# Patient Record
Sex: Male | Born: 1980
Health system: Southern US, Community
[De-identification: ages and names within clinical notes are randomized; demographics above are authoritative.]

## PROBLEM LIST (undated history)

## (undated) DIAGNOSIS — F39 Unspecified mood [affective] disorder: Secondary | ICD-10-CM

## (undated) DIAGNOSIS — F329 Major depressive disorder, single episode, unspecified: Secondary | ICD-10-CM

## (undated) DIAGNOSIS — E739 Lactose intolerance, unspecified: Secondary | ICD-10-CM

## (undated) DIAGNOSIS — L2089 Other atopic dermatitis: Secondary | ICD-10-CM

## (undated) DIAGNOSIS — H9190 Unspecified hearing loss, unspecified ear: Secondary | ICD-10-CM

## (undated) DIAGNOSIS — E785 Hyperlipidemia, unspecified: Secondary | ICD-10-CM

## (undated) DIAGNOSIS — J309 Allergic rhinitis, unspecified: Secondary | ICD-10-CM

## (undated) DIAGNOSIS — R51 Headache: Secondary | ICD-10-CM

## (undated) DIAGNOSIS — F3289 Other specified depressive episodes: Secondary | ICD-10-CM

## (undated) DIAGNOSIS — K219 Gastro-esophageal reflux disease without esophagitis: Secondary | ICD-10-CM

## (undated) HISTORY — DX: Unspecified hearing loss, unspecified ear: H91.90

## (undated) HISTORY — DX: Hyperlipidemia, unspecified: E78.5

## (undated) HISTORY — DX: Gastro-esophageal reflux disease without esophagitis: K21.9

## (undated) HISTORY — DX: Other specified depressive episodes: F32.89

## (undated) HISTORY — DX: Other atopic dermatitis: L20.89

## (undated) HISTORY — DX: Allergic rhinitis, unspecified: J30.9

## (undated) HISTORY — DX: Headache: R51

## (undated) HISTORY — DX: Lactose intolerance, unspecified: E73.9

## (undated) HISTORY — DX: Unspecified mood (affective) disorder: F39

## (undated) HISTORY — DX: Major depressive disorder, single episode, unspecified: F32.9

---

## 1998-12-12 ENCOUNTER — Emergency Department (HOSPITAL_COMMUNITY): Admission: EM | Admit: 1998-12-12 | Discharge: 1998-12-12 | Payer: Self-pay | Admitting: Emergency Medicine

## 2004-08-10 ENCOUNTER — Emergency Department (HOSPITAL_COMMUNITY): Admission: EM | Admit: 2004-08-10 | Discharge: 2004-08-10 | Payer: Self-pay | Admitting: Emergency Medicine

## 2004-11-05 ENCOUNTER — Emergency Department (HOSPITAL_COMMUNITY): Admission: EM | Admit: 2004-11-05 | Discharge: 2004-11-05 | Payer: Self-pay | Admitting: Emergency Medicine

## 2005-05-03 ENCOUNTER — Encounter: Admission: RE | Admit: 2005-05-03 | Discharge: 2005-05-03 | Payer: Self-pay | Admitting: Emergency Medicine

## 2007-12-18 HISTORY — PX: OTHER SURGICAL HISTORY: SHX169

## 2008-03-28 ENCOUNTER — Emergency Department (HOSPITAL_COMMUNITY): Admission: EM | Admit: 2008-03-28 | Discharge: 2008-03-29 | Payer: Self-pay | Admitting: Emergency Medicine

## 2008-08-14 LAB — CONVERTED CEMR LAB
ALT: 41 units/L
AST: 34 units/L
CO2: 21 meq/L
Chloride: 105 meq/L
Eosinophils Relative: 2 %
HDL: 39 mg/dL
LDL Cholesterol: 41 mg/dL
Lymphocytes, automated: 38 %
Neutrophils Relative %: 53 %
Platelets: 298 10*3/uL
Potassium: 3.9 meq/L
RBC: 4.28 M/uL
RDW: 12.7 %
Total Bilirubin: 0.4 mg/dL
Total Protein: 7.4 g/dL
Triglyceride fasting, serum: 206 mg/dL
WBC: 6.5 10*3/uL

## 2009-03-23 ENCOUNTER — Emergency Department (HOSPITAL_COMMUNITY): Admission: EM | Admit: 2009-03-23 | Discharge: 2009-03-23 | Payer: Self-pay | Admitting: Emergency Medicine

## 2009-09-01 ENCOUNTER — Ambulatory Visit: Payer: Self-pay | Admitting: Internal Medicine

## 2009-09-01 DIAGNOSIS — R519 Headache, unspecified: Secondary | ICD-10-CM | POA: Insufficient documentation

## 2009-09-01 DIAGNOSIS — K219 Gastro-esophageal reflux disease without esophagitis: Secondary | ICD-10-CM

## 2009-09-01 DIAGNOSIS — F329 Major depressive disorder, single episode, unspecified: Secondary | ICD-10-CM

## 2009-09-01 DIAGNOSIS — R51 Headache: Secondary | ICD-10-CM

## 2009-09-01 DIAGNOSIS — Z9189 Other specified personal risk factors, not elsewhere classified: Secondary | ICD-10-CM | POA: Insufficient documentation

## 2009-09-01 DIAGNOSIS — F39 Unspecified mood [affective] disorder: Secondary | ICD-10-CM

## 2009-09-01 DIAGNOSIS — E785 Hyperlipidemia, unspecified: Secondary | ICD-10-CM | POA: Insufficient documentation

## 2009-09-01 LAB — CONVERTED CEMR LAB
Cholesterol: 201 mg/dL — ABNORMAL HIGH (ref 0–200)
Direct LDL: 136.8 mg/dL
HDL: 36.6 mg/dL — ABNORMAL LOW (ref >39.00)
Total CHOL/HDL Ratio: 5
Triglycerides: 185 mg/dL — ABNORMAL HIGH (ref 0.0–149.0)
VLDL: 37 mg/dL (ref 0.0–40.0)

## 2009-10-03 ENCOUNTER — Encounter: Payer: Self-pay | Admitting: Internal Medicine

## 2010-05-24 ENCOUNTER — Telehealth: Payer: Self-pay | Admitting: Internal Medicine

## 2010-05-29 ENCOUNTER — Ambulatory Visit: Payer: Self-pay | Admitting: Internal Medicine

## 2010-05-29 DIAGNOSIS — H9190 Unspecified hearing loss, unspecified ear: Secondary | ICD-10-CM | POA: Insufficient documentation

## 2010-05-29 DIAGNOSIS — J309 Allergic rhinitis, unspecified: Secondary | ICD-10-CM | POA: Insufficient documentation

## 2010-05-29 DIAGNOSIS — H6120 Impacted cerumen, unspecified ear: Secondary | ICD-10-CM

## 2010-09-29 ENCOUNTER — Telehealth: Payer: Self-pay | Admitting: Internal Medicine

## 2010-10-13 ENCOUNTER — Ambulatory Visit: Payer: Self-pay | Admitting: Internal Medicine

## 2010-10-17 LAB — CONVERTED CEMR LAB
ALT: 32 units/L (ref 0–53)
Basophils Absolute: 0 10*3/uL (ref 0.0–0.1)
Basophils Relative: 0.5 % (ref 0.0–3.0)
Bilirubin Urine: NEGATIVE
Bilirubin, Direct: 0.1 mg/dL (ref 0.0–0.3)
Cholesterol: 182 mg/dL (ref 0–200)
GFR calc non Af Amer: 108.63 mL/min (ref >60)
HCT: 37 % — ABNORMAL LOW (ref 39.0–52.0)
Hemoglobin, Urine: NEGATIVE
Ketones, ur: NEGATIVE mg/dL
LDL Cholesterol: 115 mg/dL — ABNORMAL HIGH (ref 0–99)
Leukocytes, UA: NEGATIVE
Lymphocytes Relative: 36 % (ref 12.0–46.0)
Lymphs Abs: 2.3 10*3/uL (ref 0.7–4.0)
MCHC: 35 g/dL (ref 30.0–36.0)
MCV: 88.7 fL (ref 78.0–100.0)
Monocytes Absolute: 0.4 10*3/uL (ref 0.1–1.0)
Neutro Abs: 3.3 10*3/uL (ref 1.4–7.7)
RBC: 4.17 M/uL — ABNORMAL LOW (ref 4.22–5.81)
Specific Gravity, Urine: >=1.03 (ref 1.000–1.030)
TSH: 1.97 microintl units/mL (ref 0.35–5.50)
Total Bilirubin: 0.9 mg/dL (ref 0.3–1.2)
Total CHOL/HDL Ratio: 5
Total Protein, Urine: NEGATIVE mg/dL
Triglycerides: 152 mg/dL — ABNORMAL HIGH (ref 0.0–149.0)
Urobilinogen, UA: 0.2 (ref 0.0–1.0)
VLDL: 30.4 mg/dL (ref 0.0–40.0)
WBC: 6.3 10*3/uL (ref 4.5–10.5)
pH: 5.5 (ref 5.0–8.0)

## 2010-10-19 ENCOUNTER — Ambulatory Visit: Payer: Self-pay | Admitting: Internal Medicine

## 2010-10-19 DIAGNOSIS — L2089 Other atopic dermatitis: Secondary | ICD-10-CM

## 2011-01-16 NOTE — Progress Notes (Signed)
Summary: Referral/VAL pt  Phone Note Call from Patient   Caller: Spouse Crystal [phone] w  Summary of Call: Pt spouse called requesting referral for pt to ENT for decreased hearing LT ear. Pt is deaf in Rt ear. Initial call taken by: Margaret Pyle, CMA,  May 24, 2010 2:57 PM  Follow-up for Phone Call        could it be ear wax?  if no pain, fever ST or headache, consider OV to check for ear wax Follow-up by: Corwin Levins MD,  May 24, 2010 3:51 PM  Additional Follow-up for Phone Call Additional follow up Details #1::        pt informed and transferred to sch appt with VAL Additional Follow-up by: Margaret Pyle, CMA,  May 24, 2010 3:57 PM

## 2011-01-16 NOTE — Progress Notes (Signed)
Summary: med refills  Phone Note Refill Request Message from:  Fax from Pharmacy on September 29, 2010 10:42 AM  Refills Requested: Medication #1:  ZOCOR 20 MG TABS take 1 by mouth qd   Last Refilled: 06/29/2010  Medication #2:  Fenofibrate 160mg  take 1 by mouth qd   Last Refilled: 06/29/2010  Medication #3:  fluoxetine 20mg  take 1 by mouth qd   Last Refilled: 06/29/2010 [facility]  Initial call taken by: Orlan Leavens RMA,  September 29, 2010 10:43 AM    New/Updated Medications: FENOFIBRATE 160 MG TABS (FENOFIBRATE) take 1 by mouth once daily Overdue for yearly physical must see md for addtional refills ZOCOR 20 MG TABS (SIMVASTATIN) take 1 by mouth once daily. Must see md b4 addtional refills FLUOXETINE HCL 20 MG CAPS (FLUOXETINE HCL) take 2 by mouth once daily. Overdue for yearly physical must see md for addtional refills Prescriptions: FLUOXETINE HCL 20 MG CAPS (FLUOXETINE HCL) take 2 by mouth once daily. Overdue for yearly physical must see md for addtional refills  #60 x 0   Entered by:   Orlan Leavens RMA   Authorized by:   Newt Lukes MD   Signed by:   Orlan Leavens RMA on 09/29/2010   Method used:   Electronically to        [facility]* (retail)       [address]       [address] Shipping/mailing       [place], [place]  [address]       Ph: [phone]       Fax: [phone]   RxID:   [id number] ZOCOR 20 MG TABS (SIMVASTATIN) take 1 by mouth once daily. Must see md b4 addtional refills  #30 x 0   Entered by:   Orlan Leavens RMA   Authorized by:   Newt Lukes MD   Signed by:   Orlan Leavens RMA on 09/29/2010   Method used:   Electronically to        [facility]* (retail)       [address]       [address] Shipping/mailing       [place], [place]  [address]       Ph: [phone]       Fax: [phone]   RxID:   [id number] FENOFIBRATE 160 MG TABS (FENOFIBRATE) take 1 by mouth once daily Overdue for yearly  physical must see md for addtional refills  #30 x 0   Entered by:   Orlan Leavens RMA   Authorized by:   Newt Lukes MD   Signed by:   Orlan Leavens RMA on 09/29/2010   Method used:   Electronically to        [facility]* (retail)       [address]       [address] Shipping/mailing       [place], [place]  [address]       Ph: [phone]       Fax: [phone]   RxID:   [id number]

## 2011-01-16 NOTE — Assessment & Plan Note (Signed)
Summary: decreased hearing-lb   Vital Signs:  Patient profile:   30 year old male Height:      67 inches (170.18 cm) Weight:      201.0 pounds (91.36 kg) O2 Sat:      97 % on Room air Temp:     97.6 degrees F (36.44 degrees C) oral Pulse rate:   74 / minute BP sitting:   122 / 78  (left arm) Cuff size:   large  Vitals Entered By: Orlan Leavens (May 29, 2010 9:12 AM)  O2 Flow:  Room air CC: Hearing loss in (L) ear Is Patient Diabetic? No Pain Assessment Patient in pain? no        Primary Care Provider:  Newt Lukes MD  CC:  Hearing loss in (L) ear.  History of Present Illness: c/o hearing loss in left ear - onset of problem 2 months ago - sudden loss - no ear pain - no drainage - no fever or headache - +psotnasal drip and allergies worse than usual - hearing described as about "50% gone" compared to previous hearing on left side - 100% loss of hearing on right side since childhood -  Current Medications (verified): 1)  Prozac 40 Mg Caps (Fluoxetine Hcl) .... Take 1 By Mouth Qd 2)  Tricor 145 Mg Tabs (Fenofibrate) .... Take 1 By Mouth Qd 3)  Zocor 20 Mg Tabs (Simvastatin) .... Take 1 By Mouth Qd 4)  Pepcid Ac 10 Mg Tabs (Famotidine) .... Use Prn 5)  Benadryl 25 Mg Caps (Diphenhydramine Hcl) .... Take 1 By Mouth At Bedtime  Allergies (verified): No Known Drug Allergies  Past History:  Past Medical History: GERD Hyperlipidemia right ear deaf, congenital depression (anger mgmt) seasonal allergies  Review of Systems       The patient complains of decreased hearing.  The patient denies fever, weight loss, chest pain, syncope, prolonged cough, headaches, and hemoptysis.    Physical Exam  General:  overweight-appearing.  alert, well-developed, well-nourished, and cooperative to examination.   spouse at side Ears:  normal pinnae bilaterally, without erythema, swelling, or tenderness to palpation. R TM clear, without effusion, or cerumen impaction. L TM  initially occuluded with soft wax - after irrigation, TM slightly red but intact - Hearing grossly intact on left - no hearing on right Mouth:  teeth and gums in good repair; mucous membranes moist, without lesions or ulcers. oropharynx clear without exudate, no erythema.  Neurologic:  alert & oriented X3 and cranial nerves II-XII symetrically intact.  strength normal in all extremities, sensation intact to light touch, and gait normal. speech fluent without dysarthria or aphasia; follows commands with good comprehension.    Impression & Recommendations:  Problem # 1:  CERUMEN IMPACTION, LEFT (ICD-380.4)  Procedure: ear irrigation Reason: wax impaction Risk/Benefit were discussed with patient who agrees to proceed. ear was irrigated with warm water. Large amount of wax was removed. Instrumentation with metal ear loop was performed to accomplish the wax removal. Patient tolerated procedure well. Complications: none   Orders: Cerumen Impaction Removal ([address])  Problem # 2:  HEARING LOSS, BILATERAL (ICD-389.9)  congential on right, now affected on left -  though improved s/p cerumen disimpaction, will refer to ENT for formal hearing eval and tx as needed   Orders: ENT Referral (ENT)  Problem # 3:  ALLERGIC RHINITIS (ICD-477.9)  add daily loratadine His updated medication list for this problem includes:    Benadryl 25 Mg Caps (Diphenhydramine hcl) .Marland Kitchen... Take 1  by mouth at bedtime    Loratadine 10 Mg Tabs (Loratadine) .Marland Kitchen... 1 by mouth once daily  Discussed use of allergy medications and environmental measures.   Complete Medication List: 1)  Prozac 40 Mg Caps (Fluoxetine hcl) .... Take 1 by mouth qd 2)  Tricor 145 Mg Tabs (Fenofibrate) .... Take 1 by mouth qd 3)  Zocor 20 Mg Tabs (Simvastatin) .... Take 1 by mouth qd 4)  Pepcid Ac 10 Mg Tabs (Famotidine) .... Use prn 5)  Benadryl 25 Mg Caps (Diphenhydramine hcl) .... Take 1 by mouth at bedtime 6)  Loratadine 10 Mg Tabs  (Loratadine) .Marland Kitchen.. 1 by mouth once daily  Patient Instructions: 1)  it was good to see you today. 2)  your left ear has been irrigated for wax removal today - can use wax removal drops from drug store to prevent recurrence of wax buildup 3)  we'll make referral to ENT for hearing evaluation. Our office will contact you regarding this appointment once made.  4)  take OTC loradatine for your allergy and sinus symptoms - ok to continue benadryl at bedtime as ongoing 5)  Please schedule a follow-up appointment as needed/as previously scheduled.
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
## 2011-08-01 ENCOUNTER — Encounter: Payer: Self-pay | Admitting: Internal Medicine

## 2011-08-28 ENCOUNTER — Encounter: Payer: Self-pay | Admitting: Internal Medicine

## 2011-08-28 ENCOUNTER — Ambulatory Visit (INDEPENDENT_AMBULATORY_CARE_PROVIDER_SITE_OTHER): Payer: 59 | Admitting: Internal Medicine

## 2011-08-28 VITALS — BP 110/62 | HR 65 | Temp 98.7°F | Ht 67.0 in | Wt 205.0 lb

## 2011-08-28 DIAGNOSIS — R197 Diarrhea, unspecified: Secondary | ICD-10-CM

## 2011-08-28 DIAGNOSIS — E739 Lactose intolerance, unspecified: Secondary | ICD-10-CM

## 2011-08-28 MED ORDER — METRONIDAZOLE 500 MG PO TABS: 500.0000 mg | ORAL_TABLET | Freq: Three times a day (TID) | ORAL | Status: DC

## 2011-08-28 NOTE — Progress Notes (Signed)
  Subjective:    Patient ID: Jason Phillips, male    DOB: [birth year], 30 y.o.   MRN: [id number]  HPI complains of diarrhea ?lactulose intol Onset 2 week ago associated with increase frequency, loose to liquid - usual = 2-3/d, upto 6+/d last 2 weeks Also associated with diffuse abd cramping before and during bowel movement  Multiple bowel movement/day with white mucus Denies travel or exposure to undercooked foods but well water exposure 3 weeks ago at in-laws in [facility] No fever  Past Medical History  Diagnosis Date  . ALLERGIC RHINITIS   . DEPRESSION   . DERMATITIS, ATOPIC   . GERD   . Headache   . 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 HYPERLIPIDEMIA   . MOOD DISORDER   . Lactose intolerance       Review of Systems  Constitutional: Negative for fever and unexpected weight change.  Respiratory: Negative for shortness of breath and wheezing.   Cardiovascular: Negative for chest pain.  Gastrointestinal: Negative for vomiting, constipation, blood in stool and rectal pain.       Objective:   Physical Exam BP 110/62  Pulse 65  Temp(Src) 98.7 F (37.1 C) (Oral)  Ht 5\' 7"  (1.702 m)  Wt 205 lb (92.987 kg)  BMI 32.11 kg/m2  SpO2 97% Wt Readings from Last 3 Encounters:  08/28/11 205 lb (92.987 kg)  10/19/10 204 lb (92.534 kg)  05/29/10 201 lb (91.173 kg)   Constitutional:  overweight. appears well-developed and well-nourished. No distress.  Neck: Normal range of motion. Neck supple. No JVD present. No thyromegaly present.  Cardiovascular: Normal rate, regular rhythm and normal heart sounds.  No murmur heard. no BLE edema Pulmonary/Chest: Effort normal and breath sounds normal. No respiratory distress. no wheezes.  Abdominal: Soft. Bowel sounds are normal. Patient exhibits no distension. There is no tenderness.  no rebound/gaurding Skin: Skin is warm and dry.  No erythema or ulceration.  Psychiatric: he has a normal mood and affect. behavior is normal.  Judgment and thought content normal.   Lab Results  Component Value Date   WBC 6.3 10/13/2010   HGB 12.9* 10/13/2010   HCT 37.0* 10/13/2010   PLT 333.0 10/13/2010   CHOL 182 10/13/2010   TRIG 152.0* 10/13/2010   HDL 36.20* 10/13/2010   LDLDIRECT 136.8 09/01/2009   ALT 32 10/13/2010   AST 28 10/13/2010   NA 142 10/13/2010   K 4.4 10/13/2010   CL 107 10/13/2010   CREATININE 0.9 10/13/2010   BUN 13 10/13/2010   CO2 28 10/13/2010   TSH 1.97 10/13/2010      Assessment & Plan:  Diarrhea - complicating chronic freq BMs and apparent lactose intolerance (improved with Lactaid supplements until 2 weeks ago) rule out infx etiology and tx emperic Flagyl x 7 d Consider need for other GI eval depending on symptoms and response - pt/spouse understand and agree

## 2011-08-28 NOTE — Patient Instructions (Signed)
It was good to see you today. Test(s) ordered today. Your results will be called to you after review (48-72hours after test completion). If any changes need to be made, you will be notified at that time. Start Flagyl antibiotics x 1 week - Your prescription(s) have been submitted to your pharmacy. Please take as directed and contact our office if you believe you are having problem(s) with the medication(s). Continue lactose avoidance and use of Lactaid as needed If continued problems or unimproved, let us know so we can refer to GI as needed Lactose Intolerance, General Lactose intolerance is the inability to digest enough lactose. That is the sugar in milk. This inability results from a shortage of the enzyme lactase. This enzyme is normally made by the cells that line the small intestine. Lactase breaks down milk sugar into simpler forms. These can then be absorbed into the bloodstream. When there is not enough lactase to digest the amount of lactose consumed, the result may cause discomfort. While not all people low in lactase have symptoms, those who do are considered to be lactose intolerant. SYMPTOMS   Common symptoms include nausea, cramps, bloating, gas and diarrhea. These symptoms begin about 30 minutes to 2 hours after eating or drinking foods that contain lactose. How bad the symptoms get depends on the amount of lactose each person can tolerate. CAUSES Certain digestive diseases and injuries to the small intestine can reduce the amount of enzymes produced. In rare cases, children are born without the ability to make lactase. For most people, though, lactase deficiency is a condition that develops naturally over time. After about the age of 2 years, the body begins to produce less lactase. But many people may not experience symptoms (problems) until they are much older. WHO IS AT RISK FOR LACTOSE INTOLERANCE? Certain ethnic and racial populations are more widely affected than others. As many  as 75 percent of all African Americans and American Indians and 90 percent of Asian Americans are lactose intolerant. The condition is least common among persons of northern European descent.  TREATMENT  Lactose intolerance is fairly easy to treat. No treatment can improve the body's ability to produce lactase. But symptoms can be controlled through diet.   Young children with lactase deficiency should not eat any foods that contain lactose. Older children and adults do not need to avoid lactose completely. But people differ in the amounts and types of foods they can handle. For example, one person may have symptoms after drinking a small glass of milk, while another can drink one glass but not two. Others may be able to manage ice cream and aged cheeses, such as cheddar and Swiss, but not other dairy products. Dietary control of lactose intolerance depends on people learning through trial and error how much lactose they can handle.   For those who react to very small amounts of lactose or have trouble limiting their intake of foods that contain it, lactase enzymes are available without a prescription. These help people digest foods that contain lactose. The tablets are taken with the first bite of dairy food. Lactase enzyme is also available as a liquid. Adding a few drops of the enzyme will convert the lactose in milk or cream. This makes it more digestible for people with lactose intolerance.   Lactose-reduced milk and other products are available at most supermarkets. The milk contains all of the nutrients found in regular milk. It also remains fresh for about the same length of time, or longer  if it is super-pasteurized.   HOW IS NUTRITION BALANCED? Milk and other dairy products are a major source of nutrients. The most important of these nutrients is calcium. Calcium is essential for the growth and repair of bones throughout life. In the middle and later years, a shortage of calcium may lead to  thin, fragile bones that break easily. This condition is called osteoporosis. A concern for both children and adults with lactose intolerance is getting enough calcium in a diet that includes little or no milk. In planning meals, making sure that each day's diet includes enough calcium is important, even if the diet does not contain dairy products. Many non-dairy foods are high in calcium. Green vegetables, such as broccoli and kale, and fish with soft, edible bones, such as salmon and sardines, are excellent sources of calcium. To help in planning a high-calcium and low-lactose diet, the table that follows lists some common foods that are good sources of dietary calcium and shows how much lactose they contain. Recent research shows that yogurt with active cultures may be a good source of calcium for many people with lactose intolerance, even though it is fairly high in lactose. Evidence shows that the bacterial cultures that are used to make yogurt produce some of the lactase enzyme required for proper digestion of lactose. CALCIUM AND LACTOSE IN COMMON FOODS Vegetables  Calcium-fortified orange juice, 1 cup Sardines, with edible bones, 3 oz. Salmon,canned,with edible bones, 3 oz. Soymilk, fortified, 1 cup Broccoli (raw), 1 cup Orange, 1 medium Pinto beans, 1/2 cup Tuna, canned, 3 oz. Lettuce greens, 1/2 cup  Calcium Content  308-344 mg 270 mg 205 mg 200 mg 90 mg 50 mg 40 mg 10 mg 10 mg  Lactose    Content 0 0 0 0 0 0 0 0   Dairy Products  Yogurt, plain, low-fat, 1 cup Milk, reduced fat, 1 cup Swiss cheese, 1 oz. Ice cream, 1/2 cup Cottage cheese, 1/2 cup  415 mg  295 mg 270 mg 85 mg 75 mg  5 g  11g 1g 6g 2-3g   Some people with lactose intolerance may think they are not getting enough calcium and vitamin D in their diet. Talking with a caregiver or dietitian may be helpful in deciding whether any dietary supplements are needed. Taking vitamins or minerals of the wrong  kind or in the wrong amounts can be harmful. A dietitian can help in planning meals that will provide the most nutrients with the least chance of causing discomfort. WHAT IS HIDDEN LACTOSE? Although milk and foods made from milk are the only natural sources, lactose is often added to prepared foods. People with very low tolerance for lactose should know about the many food products that may contain even small amounts of lactose, such as:  Bread and other baked goods.   Processed breakfast cereals.     Instant potatoes and soups.     Powdered meal-replacement.     Margarine.   Lunch meats (non-kosher).     Candies and other snacks.     Supplements.    Pancake, biscuits and cookie mixes.   Breakfast cereals.     Salad dressings.     Some products labeled non-dairy, such as powdered coffee creamer and whipped toppings may also include ingredients that are derived from milk and therefore contain lactose. Smart shoppers learn to read food labels with care, looking not only for milk and lactose among the contents, but also for such words as whey, curds,  milk by-products, dry milk solids, and nonfat dry milk powder. If any of these are listed on a label, the product contains lactose. In addition, lactose is used as the base for more than 20 percent of prescription drugs and about 6 percent of over-the-counter medicines. Many types of birth control pills, for example, contain lactose, as do some tablets for stomach acid and gas. But these products typically affect only people with severe lactose intolerance. SUMMARY Even though lactose intolerance is widespread, it need not be a serious threat to good health. People who have trouble digesting lactose can learn which dairy products and other foods they can eat without discomfort and which ones they should avoid. Many will be able to enjoy milk, ice cream, and other such products if they take them in small amounts or eat other food at the same time.  Others can use lactase liquid or tablets to help digest the lactose. Even older women at risk for osteoporosis and growing children who must avoid milk and foods made with milk can meet most of their special dietary needs by eating greens, fish, and other calcium-rich foods that are free of lactose. A carefully chosen diet, with calcium supplements if the caregiver or dietitian recommends them, is the key to reducing symptoms and protecting future health. Document Released: 12/03/2005 Document Re-Released: 05/23/2010 [facility] Patient Information 2011 [place], [place].Lactose Free Diet Lactose is a carbohydrate that is found mainly in milk and milk products, as well as in foods with added milk or whey. Lactose must be digested by the enzyme in order to be used by the body. Lactose intolerance occurs when there is a shortage of lactase. When your body is not able to digest lactose, you may feel sick to your stomach (nausea), bloating, cramping, gas and diarrhea. TYPES OF LACTASE DEFICIENCY  Primary lactase deficiency. This is the most common type. It is characterized by a slow decrease in lactase activity.   Secondary lactase deficiency. This occurs following injury to the small intestinal mucosa as a result of diseases such as celiac disease, nontropical sprue, infectious gastroenteritis (stomach virus), malnutrition, parasites, or inflammatory bowel disease. It can also occur after treatment with medications that kill germs (antibiotics) or cancer drugs, or as a result of surgery.  Tolerance to lactose varies widely, and each person must determine how much milk can be consumed without developing symptoms. Drinking smaller portions of milk throughout the day may be helpful. Some studies suggest that slowing gastric emptying may help increase tolerance of milk products. This may be done by:  Consuming milk or milk products with a meal rather than alone.   Using milk with a higher fat content.  There are  many dairy products that may be tolerated better than milk by some people:  Cheese (especially aged cheese) - the lactose content is much lower than in milk.   The use of cultured dairy products such as yogurt, buttermilk, cottage cheese, and sweet acidophilus milk (Kefir) for lactase-deficient individuals is usually well tolerated. This is because the healthy bacteria help digest lactose.   Lactose-hydrolyzed milk (Lactaid) contains 40-90% less lactose than milk and may also be well tolerated.  ADEQUACY These diets may be deficient in calcium, riboflavin, and vitamin D, according to the Recommended Dietary Allowances of the [employer]. Depending on individual tolerances and the use of milk substitutes, milk, or other dairy products, these recommendations may be met. SPECIAL NOTES  Lactose is a carbohydrates. The major food source is dairy products.  Reading food labels is important. Many products contain lactose even when they are not made from milk. Look for the following words: whey, milk solids, dry milk solids, nonfat dry milk powder. Typical sources of lactose other than dairy products include breads, candies, cold cuts, prepared and processed foods, and commercial sauces and gravies.   All foods must be prepared without milk, cream, or other dairy foods.   A vitamin/mineral supplement may be necessary. Consult your physician or Registered Dietitian.   Lactose also is found in many prescription and over-the-counter medications.   Soy milk and lactose-free supplements may be used as an alternative to milk.  FOOD GROUP  ALLOWED/RECOMMENDED  AVOID/USE SPARINGLY   BREADS / STARCHES  4 servings or more*  Breads and rolls made without milk. [place], [place], or [place] bread.  Breads and rolls that contain milk. Prepared mixes such as muffins, biscuits, waffles, pancakes. Sweet rolls, donuts, [place] toast (if made with milk or lactose).   Crackers:  Soda crackers, graham crackers.  Any crackers prepared without lactose.  Zwieback crackers, corn curls, or any that contain lactose.   Cereals:  Cooked or dry cereals prepared without lactose (read labels).  Cooked or dry cereals prepared with lactose (read labels). Total, Cocoa Krispies. Special K.   Potatoes / Pasta / Rice:  Any prepared without milk or lactose. Popcorn.  Instant potatoes, frozen [place] fries, scalloped or au gratin potatoes.   VEGETABLES  2 servings or more  Fresh, frozen, and canned vegetables.  Creamed or breaded vegetables. Vegetables in a cheese sauce or with lactose-containing margarines.   FRUIT  2 servings or more  All fresh, canned, or frozen fruits that are not processed with lactose.  Any canned or frozen fruits processed with lactose.   MEAT & SUBSTITUTES  2 servings or more (4 to 6 oz. total per day)  Plain beef, chicken, fish, [place], lamb, veal, pork, or ham. Kosher prepared meat products. Strained or junior meats that do not contain milk. Eggs, soy meat substitutes, nuts.  Scrambled eggs, omelets, and souffles that contain milk. Creamed or breaded meat, fish, or fowl. Sausage products such as wieners, liver sausage, or cold cuts that contain milk solids. Cheese, cottage cheese, or cheese spreads.   MILK  None. (See "BEVERAGES" for milk substitutes. See "DESSERTS" for ice cream and frozen desserts.)  Milk (whole, 2%, skim, or chocolate). Evaporated, powdered, or condensed milk; malted milk.   SOUPS & COMBINATION FOODS  Bouillon, broth, vegetable soups, clear soups, consomms. Homemade soups made with allowed ingredients. Combination or prepared foods that do not contain milk or milk products (read labels).  Cream soups, chowders, commercially prepared soups containing lactose. Macaroni and cheese, pizza. Combination or prepared foods that contain milk or milk products.   DESSERTS & SWEETS  In moderation  Water and fruit ices; gelatin; angel food cake. Homemade cookies, pies, or cakes made from  allowed ingredients. Pudding (if made with water or a milk substitute). Lactose-free tofu desserts. Sugar, honey, corn syrup, jam, jelly; marmalade; molasses (beet sugar); Pure sugar candy; marshmallows.  Ice cream, ice milk, sherbet, custard, pudding, frozen yogurt. Commercial cake and cookie mixes. Desserts that contain chocolate. Pie crust made with milk-containing margarine; reduced-calorie desserts made with a sugar substitute that contains lactose. Toffee, peppermint, butterscotch, chocolate, caramels.   FATS & OILS  In moderation  Butter (as tolerated; contains very small amounts of lactose). Margarines and dressings that do not contain milk, Vegetable oils, shortening, Miracle Whip, mayonnaise, nondairy cream &  whipped toppings without lactose or milk solids added (examples: Coffee Rich, Carnation Coffeemate, Rich's Whipped Topping, PolyRich). Tomasa Blase.  Margarines and salad dressings containing milk; cream, cream cheese; peanut butter with added milk solids, sour cream, chip dips, made with sour cream.   BEVERAGES  Carbonated drinks; tea; coffee and freeze-dried coffee; some instant coffees (check labels). Fruit drinks; fruit and vegetable juice; Rice or Soy milk.  Ovaltine, hot chocolate. Some cocoas; some instant coffees; instant iced teas; powdered fruit drinks (read labels).    CONDIMENTS /  MISCELLANEOUS  Soy sauce, carob powder, olives, gravy made with water, baker's cocoa, pickles, pure seasonings and spices, wine, pure monosodium glutamate, catsup, mustard.  Some chewing gums, chocolate, some cocoas. Certain antibiotics and vitamin / mineral preparations. Spice blends if they contain milk products. MSG extender. Artificial sweeteners that contain lactose such as Equal (Nutra-Sweet) and Sweet 'n Low. Some nondairy creamers (read labels).   * These amounts indicate the minimum number of servings needed from the basic food groups to provide a variety of nutrients essential to good health. A  maximum amount is listed if intake of certain foods must be controlled. Combination foods may count as full or partial servings from the food groups. Dark green, leafy, or orange vegetables are recommended 3 or 4 times weekly to provide vitamin A. A good source of vitamin C is recommended daily. Potatoes may be included as a serving of vegetables. SAMPLE MENU*  Breakfast   Orange Juice.   Banana.    Bran flakes.      Nondairy Creamer.   Vienna Bread (toasted).     Butter or milk-free margarine.     Coffee or tea.       Noon Meal   Chicken Breast.   Rice.    Green beans.      Butter or milk-free margarine.   Fresh melon.     Coffee or tea.       Evening Meal   Roast Beef.   Baked potato.     Butter or milk-free margarine.     Broccoli.     Lettuce salad with vinegar and oil dressing.   [employer].     Coffee or tea.      Document Released: 05/25/2002 Document Re-Released: 05/31/2008 [facility] Patient Information 2011 [place], [place].

## 2011-08-29 ENCOUNTER — Ambulatory Visit: Payer: Self-pay | Admitting: Internal Medicine

## 2011-09-03 ENCOUNTER — Other Ambulatory Visit: Payer: 59

## 2011-09-03 ENCOUNTER — Other Ambulatory Visit: Payer: Self-pay | Admitting: Internal Medicine

## 2011-09-03 DIAGNOSIS — R197 Diarrhea, unspecified: Secondary | ICD-10-CM

## 2011-09-04 ENCOUNTER — Other Ambulatory Visit: Payer: Self-pay | Admitting: *Deleted

## 2011-09-04 DIAGNOSIS — R197 Diarrhea, unspecified: Secondary | ICD-10-CM

## 2011-09-04 LAB — OVA AND PARASITE SCREEN

## 2011-09-04 MED ORDER — METRONIDAZOLE 500 MG PO TABS: 500.0000 mg | ORAL_TABLET | Freq: Three times a day (TID) | ORAL | Status: AC

## 2011-09-07 LAB — STOOL CULTURE

## 2011-09-11 LAB — RAPID URINE DRUG SCREEN, HOSP PERFORMED
Benzodiazepines: NOT DETECTED
Cocaine: NOT DETECTED
Tetrahydrocannabinol: NOT DETECTED

## 2011-09-11 LAB — URINALYSIS, ROUTINE W REFLEX MICROSCOPIC
Bilirubin Urine: NEGATIVE
Glucose, UA: NEGATIVE
Ketones, ur: NEGATIVE

## 2011-09-11 LAB — POCT I-STAT, CHEM 8
Chloride: 109
Creatinine, Ser: 1.2
Potassium: 3.4 — ABNORMAL LOW
Sodium: 146 — ABNORMAL HIGH

## 2011-09-11 LAB — CBC
HCT: 39.1
Hemoglobin: 13.5
Platelets: 339
RBC: 4.48
RDW: 12.7

## 2011-09-11 LAB — PROTIME-INR: Prothrombin Time: 13.2

## 2011-10-15 ENCOUNTER — Telehealth: Payer: Self-pay | Admitting: *Deleted

## 2011-10-15 DIAGNOSIS — Z0000 Encounter for general adult medical examination without abnormal findings: Secondary | ICD-10-CM

## 2011-10-15 NOTE — Telephone Encounter (Signed)
Left msg on vm schedule cpx for 10/22/11. Need labs. Called wife back entered cpx labs in epic...10/15/11@11 :57am/LMB

## 2011-10-16 ENCOUNTER — Other Ambulatory Visit: Payer: Self-pay | Admitting: Internal Medicine

## 2011-10-16 ENCOUNTER — Other Ambulatory Visit (INDEPENDENT_AMBULATORY_CARE_PROVIDER_SITE_OTHER): Payer: 59

## 2011-10-16 DIAGNOSIS — Z0000 Encounter for general adult medical examination without abnormal findings: Secondary | ICD-10-CM

## 2011-10-16 LAB — BASIC METABOLIC PANEL
CO2: 24 mEq/L (ref 19–32)
Calcium: 9.5 mg/dL (ref 8.4–10.5)
Chloride: 112 mEq/L (ref 96–112)
Sodium: 143 mEq/L (ref 135–145)

## 2011-10-16 LAB — URINALYSIS, ROUTINE W REFLEX MICROSCOPIC
Nitrite: NEGATIVE
Specific Gravity, Urine: 1.02 (ref 1.000–1.030)
Urine Glucose: NEGATIVE
Urobilinogen, UA: 0.2 (ref 0.0–1.0)

## 2011-10-16 LAB — CBC WITH DIFFERENTIAL/PLATELET
Basophils Absolute: 0 10*3/uL (ref 0.0–0.1)
Eosinophils Absolute: 0.2 10*3/uL (ref 0.0–0.7)
HCT: 37.7 % — ABNORMAL LOW (ref 39.0–52.0)
Hemoglobin: 13 g/dL (ref 13.0–17.0)
Lymphs Abs: 2.5 10*3/uL (ref 0.7–4.0)
MCHC: 34.6 g/dL (ref 30.0–36.0)
MCV: 88.8 fl (ref 78.0–100.0)
Neutro Abs: 4.1 10*3/uL (ref 1.4–7.7)
RDW: 13.2 % (ref 11.5–14.6)

## 2011-10-16 LAB — HEPATIC FUNCTION PANEL
AST: 31 U/L (ref 0–37)
Albumin: 4.2 g/dL (ref 3.5–5.2)

## 2011-10-16 LAB — LIPID PANEL: Cholesterol: 214 mg/dL — ABNORMAL HIGH (ref 0–200)

## 2011-10-22 ENCOUNTER — Encounter: Payer: Self-pay | Admitting: Internal Medicine

## 2011-10-22 ENCOUNTER — Ambulatory Visit (INDEPENDENT_AMBULATORY_CARE_PROVIDER_SITE_OTHER): Payer: 59 | Admitting: Internal Medicine

## 2011-10-22 VITALS — BP 102/72 | HR 77 | Temp 98.5°F | Ht 67.0 in | Wt 203.1 lb

## 2011-10-22 DIAGNOSIS — Z0000 Encounter for general adult medical examination without abnormal findings: Secondary | ICD-10-CM

## 2011-10-22 DIAGNOSIS — E785 Hyperlipidemia, unspecified: Secondary | ICD-10-CM

## 2011-10-22 MED ORDER — SIMVASTATIN 40 MG PO TABS: 40.0000 mg | ORAL_TABLET | Freq: Every day | ORAL | Status: DC

## 2011-10-22 NOTE — Progress Notes (Signed)
Subjective:    Patient ID: Jason Phillips, male    DOB: September 03, 1981, 30 y.o.   MRN: [id number]  HPI patient is here today for annual physical. Patient feels well overall:  also reviewed chronic med issues:  dyslipidemia -  on med tx for several years - statin + fenofibrate the patient reports compliance with medication(s) as prescribed. Denies adverse side effects.  mood d/o with anger mgmt issues -  denies depression symptoms  mood swings well controlled with current dose prozac -  has been stable on same med dose for years  never been to counseling - has considered same  Past Medical History  Diagnosis Date  . ALLERGIC RHINITIS   . DEPRESSION   . DERMATITIS, ATOPIC   . GERD   . Headache   . 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 HYPERLIPIDEMIA   . MOOD DISORDER   . Lactose intolerance    Family History  Problem Relation Age of Onset  . Arthritis Other   . Diabetes Other   . Hyperlipidemia Other   . Hypertension Other   . Heart disease Other   . Stroke Other    History  Substance Use Topics  . Smoking status: Never Smoker   . Smokeless tobacco: Not on file   Comment: Married, lives with spouse (who is pharmacist at [employer]. Works in TEFL teacher  . Alcohol Use: Yes     occassional     Review of Systems Constitutional: Negative for fever or weight change.  Respiratory: Negative for cough and shortness of breath.   Cardiovascular: Negative for chest pain or palpitations.  Gastrointestinal: Negative for abdominal pain, no bowel changes.  Musculoskeletal: Negative for gait problem or joint swelling.  Skin: Negative for rash.  Neurological: Negative for dizziness or headache.  No other specific complaints in a complete review of systems (except as listed in HPI above).     Objective:   Physical Exam BP 102/72  Pulse 77  Temp(Src) 98.5 F (36.9 C) (Oral)  Ht 5\' 7"  (1.702 m)  Wt 203 lb 1.9 oz (92.135 kg)  BMI 31.81 kg/m2   SpO2 97% Wt Readings from Last 3 Encounters:  10/22/11 203 lb 1.9 oz (92.135 kg)  08/28/11 205 lb (92.987 kg)  10/19/10 204 lb (92.534 kg)   Constitutional:  He is overweight but appears well-developed and well-nourished. No distress.  Neck: Normal range of motion. Neck supple. No JVD present. No thyromegaly present.  Cardiovascular: Normal rate, regular rhythm and normal heart sounds.  No murmur heard. no BLE edema Pulmonary/Chest: Effort normal and breath sounds normal. No respiratory distress. no wheezes.  Abdominal: Soft. Bowel sounds are normal. Patient exhibits no distension. There is no tenderness.  Musculoskeletal: Normal range of motion. Patient exhibits no edema.  Neurological: he is alert and oriented to person, place, and time. No cranial nerve deficit. Coordination normal.  Skin: Skin is warm and dry.  No erythema or ulceration.  Psychiatric: he has a normal mood and affect. behavior is normal. Judgment and thought content normal.   Lab Results  Component Value Date   WBC 7.4 10/16/2011   HGB 13.0 10/16/2011   HCT 37.7* 10/16/2011   PLT 324.0 10/16/2011   GLUCOSE 94 10/16/2011   CHOL 214* 10/16/2011   TRIG 206.0* 10/16/2011   HDL 39.70 10/16/2011   LDLDIRECT 164.5 10/16/2011   LDLCALC 115* 10/13/2010   ALT 42 10/16/2011   AST 31 10/16/2011   NA 143  10/16/2011   K 4.1 10/16/2011   CL 112 10/16/2011   CREATININE 0.8 10/16/2011   BUN 14 10/16/2011   CO2 24 10/16/2011   TSH 2.11 10/16/2011   INR 1.0 03/28/2008        Assessment & Plan:  CPX - v70.0 - Patient has been counseled on age-appropriate routine health concerns for screening and prevention. These are reviewed and up-to-date. Immunizations are up-to-date or declined. Labs reviewed.

## 2011-10-22 NOTE — Assessment & Plan Note (Signed)
On statin and fenofibrate - upward trend LDL reviewed Denies change in diet, exercise or compliance Increase statin dose - recheck in 6 mo

## 2011-10-22 NOTE — Patient Instructions (Signed)
It was good to see you today. Physical exam looks good - but we need to increase the therapy for your cholesterol numbers Increase Simvastatin to 40mg  daily - Your prescription(s) have been submitted to your pharmacy. Please take as directed and contact our office if you believe you are having problem(s) with the medication(s). Health Maintenance reviewed - decline flu shot - everything else up to date.  Please schedule followup in 6 months to recheck cholesterol and liver, call sooner if problems.

## 2011-10-26 ENCOUNTER — Telehealth: Payer: Self-pay

## 2011-10-26 NOTE — Telephone Encounter (Signed)
A user error has taken place: encounter opened in error, closed for administrative reasons.

## 2011-10-30 ENCOUNTER — Other Ambulatory Visit: Payer: Self-pay | Admitting: Internal Medicine

## 2012-06-02 ENCOUNTER — Ambulatory Visit (INDEPENDENT_AMBULATORY_CARE_PROVIDER_SITE_OTHER): Payer: 59 | Admitting: Internal Medicine

## 2012-06-02 ENCOUNTER — Encounter: Payer: Self-pay | Admitting: Internal Medicine

## 2012-06-02 ENCOUNTER — Other Ambulatory Visit (INDEPENDENT_AMBULATORY_CARE_PROVIDER_SITE_OTHER): Payer: 59

## 2012-06-02 VITALS — BP 130/68 | HR 76 | Temp 98.2°F | Resp 16

## 2012-06-02 DIAGNOSIS — E785 Hyperlipidemia, unspecified: Secondary | ICD-10-CM

## 2012-06-02 DIAGNOSIS — A09 Infectious gastroenteritis and colitis, unspecified: Secondary | ICD-10-CM

## 2012-06-02 DIAGNOSIS — R197 Diarrhea, unspecified: Secondary | ICD-10-CM

## 2012-06-02 LAB — CBC WITH DIFFERENTIAL/PLATELET
Basophils Absolute: 0 10*3/uL (ref 0.0–0.1)
Eosinophils Absolute: 0.1 10*3/uL (ref 0.0–0.7)
HCT: 39.3 % (ref 39.0–52.0)
Lymphs Abs: 1.6 10*3/uL (ref 0.7–4.0)
MCHC: 34.1 g/dL (ref 30.0–36.0)
MCV: 87.6 fl (ref 78.0–100.0)
Monocytes Absolute: 0.6 10*3/uL (ref 0.1–1.0)
Platelets: 293 10*3/uL (ref 150.0–400.0)
RDW: 13 % (ref 11.5–14.6)

## 2012-06-02 LAB — COMPREHENSIVE METABOLIC PANEL
ALT: 39 U/L (ref 0–53)
Alkaline Phosphatase: 36 U/L — ABNORMAL LOW (ref 39–117)
Creatinine, Ser: 0.7 mg/dL (ref 0.4–1.5)
Sodium: 141 mEq/L (ref 135–145)
Total Bilirubin: 0.5 mg/dL (ref 0.3–1.2)
Total Protein: 7.6 g/dL (ref 6.0–8.3)

## 2012-06-02 LAB — CK: Total CK: 54 U/L (ref 7–232)

## 2012-06-02 MED ORDER — METRONIDAZOLE 500 MG PO TABS: 500.0000 mg | ORAL_TABLET | Freq: Three times a day (TID) | ORAL | Status: AC

## 2012-06-02 MED ORDER — PROMETHAZINE HCL 12.5 MG PO TABS: 12.5000 mg | ORAL_TABLET | Freq: Four times a day (QID) | ORAL | Status: DC | PRN

## 2012-06-02 MED ORDER — CIPROFLOXACIN HCL 500 MG PO TABS: 500.0000 mg | ORAL_TABLET | Freq: Two times a day (BID) | ORAL | Status: AC

## 2012-06-02 NOTE — Assessment & Plan Note (Signed)
He has some myalgias so I will check his CK level today

## 2012-06-02 NOTE — Patient Instructions (Signed)
Viral Gastroenteritis Viral gastroenteritis is also known as stomach flu. This condition affects the stomach and intestinal tract. It can cause sudden diarrhea and vomiting. The illness typically lasts 3 to 8 days. Most people develop an immune response that eventually gets rid of the virus. While this natural response develops, the virus can make you quite ill. CAUSES  Many different viruses can cause gastroenteritis, such as rotavirus or noroviruses. You can catch one of these viruses by consuming contaminated food or water. You may also catch a virus by sharing utensils or other personal items with an infected person or by touching a contaminated surface. SYMPTOMS  The most common symptoms are diarrhea and vomiting. These problems can cause a severe loss of body fluids (dehydration) and a body salt (electrolyte) imbalance. Other symptoms may include:  Fever.   Headache.   Fatigue.   Abdominal pain.  DIAGNOSIS  Your caregiver can usually diagnose viral gastroenteritis based on your symptoms and a physical exam. A stool sample may also be taken to test for the presence of viruses or other infections. TREATMENT  This illness typically goes away on its own. Treatments are aimed at rehydration. The most serious cases of viral gastroenteritis involve vomiting so severely that you are not able to keep fluids down. In these cases, fluids must be given through an intravenous line (IV). HOME CARE INSTRUCTIONS   Drink enough fluids to keep your urine clear or pale yellow. Drink small amounts of fluids frequently and increase the amounts as tolerated.   Ask your caregiver for specific rehydration instructions.   Avoid:   Foods high in sugar.   Alcohol.   Carbonated drinks.   Tobacco.   Juice.   Caffeine drinks.   Extremely hot or cold fluids.   Fatty, greasy foods.   Too much intake of anything at one time.   Dairy products until 24 to 48 hours after diarrhea stops.   You may  consume probiotics. Probiotics are active cultures of beneficial bacteria. They may lessen the amount and number of diarrheal stools in adults. Probiotics can be found in yogurt with active cultures and in supplements.   Wash your hands well to avoid spreading the virus.   Only take over-the-counter or prescription medicines for pain, discomfort, or fever as directed by your caregiver. Do not give aspirin to children. Antidiarrheal medicines are not recommended.   Ask your caregiver if you should continue to take your regular prescribed and over-the-counter medicines.   Keep all follow-up appointments as directed by your caregiver.  SEEK IMMEDIATE MEDICAL CARE IF:   You are unable to keep fluids down.   You do not urinate at least once every 6 to 8 hours.   You develop shortness of breath.   You notice blood in your stool or vomit. This may look like coffee grounds.   You have abdominal pain that increases or is concentrated in one small area (localized).   You have persistent vomiting or diarrhea.   You have a fever.   The patient is a child younger than 3 months, and he or she has a fever.   The patient is a child older than 3 months, and he or she has a fever and persistent symptoms.   The patient is a child older than 3 months, and he or she has a fever and symptoms suddenly get worse.   The patient is a baby, and he or she has no tears when crying.  MAKE SURE YOU:     Understand these instructions.   Will watch your condition.   Will get help right away if you are not doing well or get worse.  Document Released: 12/03/2005 Document Revised: 11/22/2011 Document Reviewed: 09/19/2011 [employer] Patient Information 2012 [employer]. 

## 2012-06-02 NOTE — Progress Notes (Signed)
Subjective:    Patient ID: Jason Phillips, male    DOB: 1981-11-18, 31 y.o.   MRN: [id number]  Diarrhea  This is a new problem. The current episode started in the past 7 days. The problem occurs 5 to 10 times per day. The problem has been gradually worsening. The stool consistency is described as watery and blood tinged. Associated symptoms include abdominal pain (intermittent cramping), chills, myalgias and sweats. Pertinent negatives include no arthralgias, bloating, coughing, fever, headaches, increased  flatus, URI, vomiting or weight loss. Nothing aggravates the symptoms. Risk factors include suspect food intake. He has tried anti-motility drug for the symptoms. The treatment provided moderate relief.      Review of Systems  Constitutional: Positive for chills. Negative for fever, weight loss, diaphoresis, activity change, appetite change, fatigue and unexpected weight change.  HENT: Negative.   Eyes: Negative.   Respiratory: Negative.  Negative for cough.   Cardiovascular: Negative.   Gastrointestinal: Positive for nausea, abdominal pain (intermittent cramping) and diarrhea. Negative for vomiting, constipation, blood in stool, anal bleeding, rectal pain, bloating and flatus.  Genitourinary: Negative.   Musculoskeletal: Positive for myalgias. Negative for back pain, joint swelling, arthralgias and gait problem.  Skin: Negative for color change, pallor, rash and wound.  Neurological: Negative for dizziness, tremors, seizures, syncope, facial asymmetry, speech difficulty, weakness, light-headedness, numbness and headaches.  Hematological: Negative for adenopathy. Does not bruise/bleed easily.  Psychiatric/Behavioral: Negative.        Objective:   Physical Exam  Vitals reviewed. Constitutional: He is oriented to person, place, and time. He appears well-developed and well-nourished.  Non-toxic appearance. He does not have a sickly appearance. He does not appear ill. No distress.    HENT:  Head: Normocephalic and atraumatic.  Mouth/Throat: Oropharynx is clear and moist. No oropharyngeal exudate.  Eyes: Conjunctivae are normal. Right eye exhibits no discharge. Left eye exhibits no discharge. No scleral icterus.  Neck: Normal range of motion. Neck supple. No JVD present. No tracheal deviation present. No thyromegaly present.  Cardiovascular: Normal rate, regular rhythm, normal heart sounds and intact distal pulses.  Exam reveals no gallop and no friction rub.   No murmur heard. Pulmonary/Chest: Effort normal and breath sounds normal. No stridor. No respiratory distress. He has no wheezes. He has no rales. He exhibits no tenderness.  Abdominal: Soft. Bowel sounds are normal. He exhibits no distension and no mass. There is no tenderness. There is no rebound and no guarding.  Musculoskeletal: Normal range of motion. He exhibits no edema and no tenderness.  Lymphadenopathy:    He has no cervical adenopathy.  Neurological: He is oriented to person, place, and time.  Skin: Skin is warm and dry. No rash noted. He is not diaphoretic. No erythema. No pallor.  Psychiatric: He has a normal mood and affect. His behavior is normal. Judgment and thought content normal.      Lab Results  Component Value Date   WBC 7.4 10/16/2011   HGB 13.0 10/16/2011   HCT 37.7* 10/16/2011   PLT 324.0 10/16/2011   GLUCOSE 94 10/16/2011   CHOL 214* 10/16/2011   TRIG 206.0* 10/16/2011   HDL 39.70 10/16/2011   LDLDIRECT 164.5 10/16/2011   LDLCALC 115* 10/13/2010   ALT 42 10/16/2011   AST 31 10/16/2011   NA 143 10/16/2011   K 4.1 10/16/2011   CL 112 10/16/2011   CREATININE 0.8 10/16/2011   BUN 14 10/16/2011   CO2 24 10/16/2011   TSH 2.11 10/16/2011   INR  1.0 03/28/2008      Assessment & Plan:

## 2012-06-02 NOTE — Assessment & Plan Note (Signed)
The diarrhea sounds suspicious for c diff infection and/or a bacterial pathogen - both of which we have seen in our community so I have treated him empirically with flagyl and cipro, I have also asked him to do some labs to look for an inflammatory process (esr) and to look for dehydration and anemia or elevated WBC count, he will take phenergan as needed for nausea and will check his stool for pathogens

## 2012-06-06 ENCOUNTER — Encounter: Payer: Self-pay | Admitting: Internal Medicine

## 2012-06-06 LAB — STOOL CULTURE

## 2012-06-24 LAB — CLOSTRIDIUM DIFFICILE TOXIN

## 2012-07-07 ENCOUNTER — Encounter (HOSPITAL_COMMUNITY): Payer: Self-pay

## 2012-07-07 ENCOUNTER — Emergency Department (HOSPITAL_COMMUNITY)
Admission: EM | Admit: 2012-07-07 | Discharge: 2012-07-07 | Disposition: A | Discharge status: Home / Self Care | Payer: 59 | Source: Home / Self Care | Attending: Emergency Medicine | Admitting: Emergency Medicine

## 2012-07-07 DIAGNOSIS — IMO0002 Reserved for concepts with insufficient information to code with codable children: Secondary | ICD-10-CM

## 2012-07-07 DIAGNOSIS — T148XXA Other injury of unspecified body region, initial encounter: Secondary | ICD-10-CM

## 2012-07-07 MED ORDER — TETANUS-DIPHTH-ACELL PERTUSSIS 5-2.5-18.5 LF-MCG/0.5 IM SUSP
INTRAMUSCULAR | Status: AC
  Filled 2012-07-07: qty 0.5

## 2012-07-07 MED ORDER — HYDROCODONE-ACETAMINOPHEN 5-325 MG PO TABS
ORAL_TABLET | ORAL | Status: AC
Start: 2012-07-07 — End: 2012-07-17

## 2012-07-07 MED ORDER — DIPHTH-ACELL PERTUSSIS-TETANUS 25-58-10 LF-MCG/0.5 IM SUSP
0.5000 mL | Freq: Once | INTRAMUSCULAR | Status: AC
  Administered 2012-07-07: 0.5 mL via INTRAMUSCULAR

## 2012-07-07 NOTE — ED Provider Notes (Signed)
Chief Complaint  Patient presents with  . Hand Injury    History of Present Illness:  Jason Phillips is a 31 year old male who is well-known to me who lacerated his dominant right hand, thumb, index finger, in the webspace today or working on a lawnmower. He had the lawnmower up on a jack and the jack slipped pinching the webspace of his right hand. Bleeding is controlled. He states the webspace feels numb but denies any numbness in the thumb or the index finger. He's able to move all his digits well and appears to have normal function of his flexor and extensor tendons. He cannot recall when his last tetanus vaccine was in he was given 1 today.  Review of Systems:  Other than noted above, the patient denies any of the following symptoms: Systemic:  No fever or chills. Musculoskeletal:  No joint pain or decreased range of motion. Neuro:  No numbness, tingling, or weakness.  PMFSH:  Past medical history, family history, social history, meds, and allergies were reviewed.  Physical Exam:   Vital signs:  BP 146/78  Pulse 90  Temp 99.1 F (37.3 C) (Oral)  Resp 22  SpO2 98% Ext:  There is a large laceration on the right hand beginning on the volar aspect of the proximal phalanx of the thumb, extending to the webspace and measuring 4 cm in total. In the webspace there was a large flap laceration with only a very tenuous connection to the underlying subcutaneous tissue. The wound was contaminated with a large amount of debris.  All joints had a full ROM without pain.  Pulses were full.  Good capillary refill in all digits.  No edema. Neurological:  Alert and oriented.  No muscle weakness.  Sensation was intact to light touch.   Procedure: Verbal informed consent was obtained.  The patient was informed of the risks and benefits of the procedure and understands and accepts.  Identity of the patient was verified verbally and by wristband.   The laceration area described above was prepped with Betadine and  copiously irrigated with a large amount of saline  and anesthetized with 10 mL of 2% Xylocaine without epinephrine.  The wound was then closed as follows:  The wound is debrided of all visible debris using sharp dissection. The devitalized tissue was snipped off. The wound was then closed with 16 5-0 nylon sutures.  There were no immediate complications, and the patient tolerated the procedure well. The laceration was then cleansed, Bacitracin ointment was applied and a clean, dry pressure dressing was put on.   Medications given in [facility]:  The patient was given a Tdap vaccine and tolerated this well without any immediate side effects.  Assessment:  The encounter diagnosis was Laceration.  Plan:   1.  The following meds were prescribed:   New Prescriptions   HYDROCODONE-ACETAMINOPHEN (NORCO/VICODIN) 5-325 MG PER TABLET    1 to 2 tabs every 4 to 6 hours as needed for pain.   2.  The patient was instructed in wound care and pain control, and handouts were given. 3.  The patient was told to return in 14 days for suture removal or wound recheck or sooner if any sign of infection.     Reuben Likes, MD 07/07/12 [id number]

## 2012-07-07 NOTE — ED Notes (Signed)
Working on Fire[employer], had it up on a jack and the jack slipped, pinching his dominant hand,thumb and index finger web space, causing laceration; c/o area feels numb, but painful to touch; bleeding currently controlled

## 2012-07-07 NOTE — ED Notes (Signed)
Discussed to return for any wound concerns and in 2 weeks for suture removal; change dressing daily or when gets dirty or wet

## 2012-10-17 ENCOUNTER — Other Ambulatory Visit: Payer: Self-pay | Admitting: Internal Medicine

## 2013-01-15 ENCOUNTER — Other Ambulatory Visit (INDEPENDENT_AMBULATORY_CARE_PROVIDER_SITE_OTHER): Payer: 59

## 2013-01-15 ENCOUNTER — Other Ambulatory Visit: Payer: Self-pay | Admitting: *Deleted

## 2013-01-15 DIAGNOSIS — Z0000 Encounter for general adult medical examination without abnormal findings: Secondary | ICD-10-CM

## 2013-01-15 LAB — BASIC METABOLIC PANEL
BUN: 16 mg/dL (ref 6–23)
GFR: 127.11 mL/min (ref >60.00)
Potassium: 3.9 mEq/L (ref 3.5–5.1)
Sodium: 142 mEq/L (ref 135–145)

## 2013-01-15 LAB — URINALYSIS, ROUTINE W REFLEX MICROSCOPIC
Bilirubin Urine: NEGATIVE
Leukocytes, UA: NEGATIVE
Nitrite: NEGATIVE
Specific Gravity, Urine: 1.025 (ref 1.000–1.030)
Urobilinogen, UA: 0.2 (ref 0.0–1.0)
pH: 6 (ref 5.0–8.0)

## 2013-01-15 LAB — CBC WITH DIFFERENTIAL/PLATELET
Eosinophils Absolute: 0.2 10*3/uL (ref 0.0–0.7)
MCHC: 34.1 g/dL (ref 30.0–36.0)
MCV: 87.8 fl (ref 78.0–100.0)
Monocytes Absolute: 0.4 10*3/uL (ref 0.1–1.0)
Neutrophils Relative %: 50.7 % (ref 43.0–77.0)
Platelets: 328 10*3/uL (ref 150.0–400.0)
RDW: 12.9 % (ref 11.5–14.6)

## 2013-01-15 LAB — HEPATIC FUNCTION PANEL
AST: 27 U/L (ref 0–37)
Total Bilirubin: 0.8 mg/dL (ref 0.3–1.2)

## 2013-01-15 LAB — LIPID PANEL
HDL: 38.5 mg/dL — ABNORMAL LOW (ref >39.00)
LDL Cholesterol: 115 mg/dL — ABNORMAL HIGH (ref 0–99)
Total CHOL/HDL Ratio: 5

## 2013-01-15 LAB — LDL CHOLESTEROL, DIRECT: Direct LDL: 110.2 mg/dL

## 2013-01-21 ENCOUNTER — Encounter: Payer: Self-pay | Admitting: Internal Medicine

## 2013-01-21 ENCOUNTER — Ambulatory Visit (INDEPENDENT_AMBULATORY_CARE_PROVIDER_SITE_OTHER): Payer: 59 | Admitting: Internal Medicine

## 2013-01-21 VITALS — BP 120/74 | HR 76 | Temp 98.3°F | Ht 67.0 in | Wt 208.9 lb

## 2013-01-21 DIAGNOSIS — M7710 Lateral epicondylitis, unspecified elbow: Secondary | ICD-10-CM

## 2013-01-21 DIAGNOSIS — M7712 Lateral epicondylitis, left elbow: Secondary | ICD-10-CM

## 2013-01-21 DIAGNOSIS — E785 Hyperlipidemia, unspecified: Secondary | ICD-10-CM

## 2013-01-21 DIAGNOSIS — Z0000 Encounter for general adult medical examination without abnormal findings: Secondary | ICD-10-CM

## 2013-01-21 MED ORDER — DICLOFENAC SODIUM 1 % TD GEL: 2.0000 g | Freq: Two times a day (BID) | TRANSDERMAL | Status: DC | PRN

## 2013-01-21 NOTE — Assessment & Plan Note (Signed)
On statin and fenofibrate - dose increased 10/2011 The current medical regimen is effective;  continue present plan and medications.  The patient is asked to make an attempt to improve diet and exercise patterns to aid in medical management of this problem.

## 2013-01-21 NOTE — Patient Instructions (Signed)
It was good to see you today. We have reviewed your prior records including labs and tests today - your cholesterol looks great! continue same dose medications without change Health Maintenance reviewed - all recommended immunizations and age-appropriate screenings are up-to-date. Work on lifestyle changes as discussed (low fat, low carb, increased protein diet; improved exercise efforts; weight loss) to control sugar, blood pressure and cholesterol levels and/or reduce risk of developing other medical problems. Look into [email or web address] or other type of food journal to assist you in this process. Medications reviewed and updated -  use Voltaren gel on your elbow 2-4x/day as needed for pain -Your prescription(s) have been submitted to your pharmacy. Please take as directed and contact our office if you believe you are having problem(s) with the medication(s). Also pick up "tennis elbow band" from the pharmacy and use as discussed to help elbow pain Please schedule followup in 1 year for annual medical Physicol and labs, call sooner if problems.   Health Maintenance, Males A healthy lifestyle and preventative care can promote health and wellness.  Maintain regular health, dental, and eye exams.   Eat a healthy diet. Foods like vegetables, fruits, whole grains, low-fat dairy products, and lean protein foods contain the nutrients you need without too many calories. Decrease your intake of foods high in solid fats, added sugars, and salt. Get information about a proper diet from your caregiver, if necessary.   Regular physical exercise is one of the most important things you can do for your health. Most adults should get at least 150 minutes of moderate-intensity exercise (any activity that increases your heart rate and causes you to sweat) each week. In addition, most adults need muscle-strengthening exercises on 2 or more days a week.     Maintain a healthy weight. The body mass index (BMI) is a  screening tool to identify possible weight problems. It provides an estimate of body fat based on height and weight. Your caregiver can help determine your BMI, and can help you achieve or maintain a healthy weight. For adults 20 years and older:   A BMI below 18.5 is considered underweight.   A BMI of 18.5 to 24.9 is normal.   A BMI of 25 to 29.9 is considered overweight.   A BMI of 30 and above is considered obese.   Maintain normal blood lipids and cholesterol by exercising and minimizing your intake of saturated fat. Eat a balanced diet with plenty of fruits and vegetables. Blood tests for lipids and cholesterol should begin at age 41 and be repeated every 5 years. If your lipid or cholesterol levels are high, you are over 50, or you are a high risk for heart disease, you may need your cholesterol levels checked more frequently. Ongoing high lipid and cholesterol levels should be treated with medicines, if diet and exercise are not effective.   If you smoke, find out from your caregiver how to quit. If you do not use tobacco, do not start.   If you choose to drink alcohol, do not exceed 2 drinks per day. One drink is considered to be 12 ounces (355 mL) of beer, 5 ounces (148 mL) of wine, or 1.5 ounces (44 mL) of liquor.   Avoid use of street drugs. Do not share needles with anyone. Ask for help if you need support or instructions about stopping the use of drugs.   High blood pressure causes heart disease and increases the risk of stroke. Blood pressure should be checked at  least every 1 to 2 years. Ongoing high blood pressure should be treated with medicines if weight loss and exercise are not effective.   If you are 59 to 34 years old, ask your caregiver if you should take aspirin to prevent heart disease.   Diabetes screening involves taking a blood sample to check your fasting blood sugar level. This should be done once every 3 years, after age 73, if you are within normal weight and  without risk factors for diabetes. Testing should be considered at a younger age or be carried out more frequently if you are overweight and have at least 1 risk factor for diabetes.   Colorectal cancer can be detected and often prevented. Most routine colorectal cancer screening begins at the age of 57 and continues through age 45. However, your caregiver may recommend screening at an earlier age if you have risk factors for colon cancer. On a yearly basis, your caregiver may provide home test kits to check for hidden blood in the stool. Use of a small camera at the end of a tube, to directly examine the colon (sigmoidoscopy or colonoscopy), can detect the earliest forms of colorectal cancer. Talk to your caregiver about this at age 16, when routine screening begins. Direct examination of the colon should be repeated every 5 to 10 years through age 61, unless early forms of pre-cancerous polyps or small growths are found.   Hepatitis C blood testing is recommended for all people born from [birth year] through 1967 and any individual with known risks for hepatitis C.   Healthy men should no longer receive prostate-specific antigen (PSA) blood tests as part of routine cancer screening. Consult with your caregiver about prostate cancer screening.   Testicular cancer screening is not recommended for adolescents or adult males who have no symptoms. Screening includes self-exam, caregiver exam, and other screening tests. Consult with your caregiver about any symptoms you have or any concerns you have about testicular cancer.   Practice safe sex. Use condoms and avoid high-risk sexual practices to reduce the spread of sexually transmitted infections (STIs).   Use sunscreen with a sun protection factor (SPF) of 30 or greater. Apply sunscreen liberally and repeatedly throughout the day. You should seek shade when your shadow is shorter than you. Protect yourself by wearing long sleeves, pants, a wide-brimmed hat, and  sunglasses year round, whenever you are outdoors.   Notify your caregiver of new moles or changes in moles, especially if there is a change in shape or color. Also notify your caregiver if a mole is larger than the size of a pencil eraser.   A one-time screening for abdominal aortic aneurysm (AAA) and surgical repair of large AAAs by sound wave imaging (ultrasonography) is recommended for ages 68 to 9 years who are current or former smokers.   Stay current with your immunizations.  Document Released: 05/31/2008 Document Revised: 02/25/2012 Document Reviewed: 04/30/2011 [facility] Patient Information 2013 [place], [place].   Lateral Epicondylitis (Tennis Elbow) with Rehab Lateral epicondylitis involves inflammation and pain around the outer portion of the elbow. The pain is caused by inflammation of the tendons in the forearm that bring back (extend) the wrist. Lateral epicondylittis is also called tennis elbow, because it is very common in tennis players. However, it may occur in any individual who extends the wrist repetitively. If lateral epicondylitis is left untreated, it may become a chronic problem. SYMPTOMS    Pain, tenderness, and inflammation on the outer (lateral) side of the elbow.  Pain or weakness with gripping activities.   Pain that increases with wrist twisting motions (playing tennis, using a screwdriver, opening a door or a jar).   Pain with lifting objects, including a coffee cup.  CAUSES   Lateral epicondylitis is caused by inflammation of the tendons that extend the wrist. Causes of injury may include:  Repetitive stress and strain on the muscles and tendons that extend the wrist.   Sudden change in activity level or intensity.   Incorrect grip in racquet sports.   Incorrect grip size of racquet (often too large).   Incorrect hitting position or technique (usually backhand, leading with the elbow).   Using a racket that is too heavy.  RISK INCREASES  WITH:  Sports or occupations that require repetitive and/or strenuous forearm and wrist movements (tennis, squash, racquetball, carpentry).   Poor wrist and forearm strength and flexibility.   Failure to warm up properly before activity.   Resuming activity before healing, rehabilitation, and conditioning are complete.  PREVENTION    Warm up and stretch properly before activity.   Maintain physical fitness:   Strength, flexibility, and endurance.   Cardiovascular fitness.   Wear and use properly fitted equipment.   Learn and use proper technique and have a coach correct improper technique.   Wear a tennis elbow (counterforce) brace.  PROGNOSIS   The course of this condition depends on the degree of the injury. If treated properly, acute cases (symptoms lasting less than 4 weeks) are often resolved in 2 to 6 weeks. Chronic (longer lasting cases) often resolve in 3 to 6 months, but may require physical therapy. RELATED COMPLICATIONS    Frequently recurring symptoms, resulting in a chronic problem. Properly treating the problem the first time decreases frequency of recurrence.   Chronic inflammation, scarring tendon degeneration, and partial tendon tear, requiring surgery.   Delayed healing or resolution of symptoms.  TREATMENT   Treatment first involves the use of ice and medicine, to reduce pain and inflammation. Strengthening and stretching exercises may help reduce discomfort, if performed regularly. These exercises may be performed at home, if the condition is an acute injury. Chronic cases may require a referral to a physical therapist for evaluation and treatment. Your caregiver may advise a corticosteroid injection, to help reduce inflammation. Rarely, surgery is needed. MEDICATION  If pain medicine is needed, nonsteroidal anti-inflammatory medicines (aspirin and ibuprofen), or other minor pain relievers (acetaminophen), are often advised.   Do not take pain medicine for 7  days before surgery.   Prescription pain relievers may be given, if your caregiver thinks they are needed. Use only as directed and only as much as you need.   Corticosteroid injections may be recommended. These injections should be reserved only for the most severe cases, because they can only be given a certain number of times.  HEAT AND COLD  Cold treatment (icing) should be applied for 10 to 15 minutes every 2 to 3 hours for inflammation and pain, and immediately after activity that aggravates your symptoms. Use ice packs or an ice massage.   Heat treatment may be used before performing stretching and strengthening activities prescribed by your caregiver, physical therapist, or athletic trainer. Use a heat pack or a warm water soak.  SEEK MEDICAL CARE IF: Symptoms get worse or do not improve in 2 weeks, despite treatment. EXERCISES   RANGE OF MOTION (ROM) AND STRETCHING EXERCISES - Epicondylitis, Lateral (Tennis Elbow) These exercises may help you when beginning to rehabilitate your injury.  Your symptoms may go away with or without further involvement from your physician, physical therapist or athletic trainer. While completing these exercises, remember:    Restoring tissue flexibility helps normal motion to return to the joints. This allows healthier, less painful movement and activity.   An effective stretch should be held for at least 30 seconds.   A stretch should never be painful. You should only feel a gentle lengthening or release in the stretched tissue.  RANGE OF MOTION  Wrist Flexion, Active-Assisted  Extend your right / left elbow with your fingers pointing down.*   Gently pull the back of your hand towards you, until you feel a gentle stretch on the top of your forearm.   Hold this position for __________ seconds.  Repeat __________ times. Complete this exercise __________ times per day.   *If directed by your physician, physical therapist or athletic trainer, complete  this stretch with your elbow bent, rather than extended. RANGE OF MOTION  Wrist Extension, Active-Assisted  Extend your right / left elbow and turn your palm upwards.*   Gently pull your palm and fingertips back, so your wrist extends and your fingers point more toward the ground.   You should feel a gentle stretch on the inside of your forearm.   Hold this position for __________ seconds.  Repeat __________ times. Complete this exercise __________ times per day. *If directed by your physician, physical therapist or athletic trainer, complete this stretch with your elbow bent, rather than extended. STRETCH - Wrist Flexion  Place the back of your right / left hand on a tabletop, leaving your elbow slightly bent. Your fingers should point away from your body.   Gently press the back of your hand down onto the table by straightening your elbow. You should feel a stretch on the top of your forearm.   Hold this position for __________ seconds.  Repeat __________ times. Complete this stretch __________ times per day.   STRETCH  Wrist Extension   Place your right / left fingertips on a tabletop, leaving your elbow slightly bent. Your fingers should point backwards.   Gently press your fingers and palm down onto the table by straightening your elbow. You should feel a stretch on the inside of your forearm.   Hold this position for __________ seconds.  Repeat __________ times. Complete this stretch __________ times per day.   STRENGTHENING EXERCISES - Epicondylitis, Lateral (Tennis Elbow) These exercises may help you when beginning to rehabilitate your injury. They may resolve your symptoms with or without further involvement from your physician, physical therapist or athletic trainer. While completing these exercises, remember:    Muscles can gain both the endurance and the strength needed for everyday activities through controlled exercises.   Complete these exercises as instructed by your  physician, physical therapist or athletic trainer. Increase the resistance and repetitions only as guided.   You may experience muscle soreness or fatigue, but the pain or discomfort you are trying to eliminate should never worsen during these exercises. If this pain does get worse, stop and make sure you are following the directions exactly. If the pain is still present after adjustments, discontinue the exercise until you can discuss the trouble with your caregiver.  STRENGTH Wrist Flexors  Sit with your right / left forearm palm-up and fully supported on a table or countertop. Your elbow should be resting below the height of your shoulder. Allow your wrist to extend over the edge of the surface.   Loosely  holding a __________ weight, or a piece of rubber exercise band or tubing, slowly curl your hand up toward your forearm.   Hold this position for __________ seconds. Slowly lower the wrist back to the starting position in a controlled manner.  Repeat __________ times. Complete this exercise __________ times per day.   STRENGTH  Wrist Extensors  Sit with your right / left forearm palm-down and fully supported on a table or countertop. Your elbow should be resting below the height of your shoulder. Allow your wrist to extend over the edge of the surface.   Loosely holding a __________ weight, or a piece of rubber exercise band or tubing, slowly curl your hand up toward your forearm.   Hold this position for __________ seconds. Slowly lower the wrist back to the starting position in a controlled manner.  Repeat __________ times. Complete this exercise __________ times per day.   STRENGTH - Ulnar Deviators  Stand with a ____________________ weight in your right / left hand, or sit while holding a rubber exercise band or tubing, with your healthy arm supported on a table or countertop.   Move your wrist, so that your pinkie travels toward your forearm and your thumb moves away from your  forearm.   Hold this position for __________ seconds and then slowly lower the wrist back to the starting position.  Repeat __________ times. Complete this exercise __________ times per day STRENGTH - Radial Deviators  Stand with a ____________________ weight in your right / left hand, or sit while holding a rubber exercise band or tubing, with your injured arm supported on a table or countertop.   Raise your hand upward in front of you or pull up on the rubber tubing.   Hold this position for __________ seconds and then slowly lower the wrist back to the starting position.  Repeat __________ times. Complete this exercise __________ times per day. STRENGTH  Forearm Supinators   Sit with your right / left forearm supported on a table, keeping your elbow below shoulder height. Rest your hand over the edge, palm down.   Gently grip a hammer or a soup ladle.   Without moving your elbow, slowly turn your palm and hand upward to a "thumbs-up" position.   Hold this position for __________ seconds. Slowly return to the starting position.  Repeat __________ times. Complete this exercise __________ times per day.   STRENGTH  Forearm Pronators   Sit with your right / left forearm supported on a table, keeping your elbow below shoulder height. Rest your hand over the edge, palm up.   Gently grip a hammer or a soup ladle.   Without moving your elbow, slowly turn your palm and hand upward to a "thumbs-up" position.   Hold this position for __________ seconds. Slowly return to the starting position.  Repeat __________ times. Complete this exercise __________ times per day.   STRENGTH - Grip  Grasp a tennis ball, a dense sponge, or a large, rolled sock in your hand.   Squeeze as hard as you can, without increasing any pain.   Hold this position for __________ seconds. Release your grip slowly.  Repeat __________ times. Complete this exercise __________ times per day.   STRENGTH - Elbow  Extensors, Isometric  Stand or sit upright, on a firm surface. Place your right / left arm so that your palm faces your stomach, and it is at the height of your waist.   Place your opposite hand on the underside of  your forearm. Gently push up as your right / left arm resists. Push as hard as you can with both arms, without causing any pain or movement at your right / left elbow. Hold this stationary position for __________ seconds.  Gradually release the tension in both arms. Allow your muscles to relax completely before repeating. Document Released: 12/03/2005 Document Revised: 02/25/2012 Document Reviewed: 03/17/2009 [facility] Patient Information 2013 [place], [place].

## 2013-01-21 NOTE — Progress Notes (Signed)
Subjective:    Patient ID: Jason Phillips, male    DOB: 13-Feb-1981, 32 y.o.   MRN: [id number]  HPI  patient is here today for annual physical. Patient feels well overall:  also reviewed chronic med issues:  dyslipidemia - on statin + fenofibrate since mid 2000s the patient reports compliance with medication(s) as prescribed. Denies adverse side effects.  mood disorder with history of anger mgmt issues -  denies depression symptoms  mood swings well controlled with current dose prozac -  has been stable on same med dose for years  never been to counseling -   Past Medical History  Diagnosis Date  . ALLERGIC RHINITIS   . DEPRESSION   . DERMATITIS, ATOPIC   . GERD   . Headache   . 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 HYPERLIPIDEMIA   . MOOD DISORDER   . Lactose intolerance    Family History  Problem Relation Age of Onset  . Arthritis Other   . Diabetes Other   . Hyperlipidemia Other   . Hypertension Other   . Heart disease Other   . Stroke Other    History  Substance Use Topics  . Smoking status: Never Smoker   . Smokeless tobacco: Not on file     Comment: Married, lives with spouse (who is pharmacist at [employer]. Works in TEFL teacher  . Alcohol Use: No     Comment: occassional    Review of Systems  Constitutional: Negative for fever or weight change.  Respiratory: Negative for cough and shortness of breath.   Cardiovascular: Negative for chest pain or palpitations.  Gastrointestinal: Negative for abdominal pain, no bowel changes.  Musculoskeletal: Negative for gait problem or joint swelling. complains of L elbow pain x 3 weeks due to overuse at work Skin: Negative for rash.  Neurological: Negative for dizziness or headache.  No other specific complaints in a complete review of systems (except as listed in HPI above).     Objective:   Physical Exam  BP 120/74  Pulse 76  Temp 98.3 F (36.8 C) (Oral)  Ht 5\' 7"  (1.702 m)   Wt 208 lb 14.4 oz (94.756 kg)  BMI 32.72 kg/m2  SpO2 97% Wt Readings from Last 3 Encounters:  01/21/13 208 lb 14.4 oz (94.756 kg)  10/22/11 203 lb 1.9 oz (92.135 kg)  08/28/11 205 lb (92.987 kg)   Constitutional:  He is overweight but appears well-developed and well-nourished. No distress.  Neck: Normal range of motion. Neck supple. No JVD present. No thyromegaly present.  Cardiovascular: Normal rate, regular rhythm and normal heart sounds.  No murmur heard. no BLE edema Pulmonary/Chest: Effort normal and breath sounds normal. No respiratory distress. no wheezes.  Abdominal: Soft. Bowel sounds are normal. Patient exhibits no distension. There is no tenderness.  Musculoskeletal: Normal range of motion. Patient exhibits no gross deformities. L elbow with lateral epicondylitis   Neurological: he is alert and oriented to person, place, and time. No cranial nerve deficit. Coordination normal.  Skin: Skin is warm and dry.  No erythema or ulceration.  Psychiatric: he has a normal mood and affect. behavior is normal. Judgment and thought content normal.   Lab Results  Component Value Date   WBC 6.4 01/15/2013   HGB 13.2 01/15/2013   HCT 38.7* 01/15/2013   PLT 328.0 01/15/2013   GLUCOSE 94 01/15/2013   CHOL 177 01/15/2013   TRIG 118.0 01/15/2013   HDL 38.50* 01/15/2013  LDLDIRECT 110.2 01/15/2013   LDLCALC 115* 01/15/2013   ALT 28 01/15/2013   AST 27 01/15/2013   NA 142 01/15/2013   K 3.9 01/15/2013   CL 110 01/15/2013   CREATININE 0.8 01/15/2013   BUN 16 01/15/2013   CO2 25 01/15/2013   TSH 1.62 01/15/2013   INR 1.0 03/28/2008        Assessment & Plan:  CPX - v70.0 - Patient has been counseled on age-appropriate routine health concerns for screening and prevention. These are reviewed and up-to-date. Immunizations are up-to-date or declined. Labs reviewed.  L lateral epicondylitis - has used OTC ibuprofen without improvement - rx Voltaren gel and elbow band (OTC) - educated on same, to call if  worse or unimproved

## 2013-01-26 ENCOUNTER — Other Ambulatory Visit: Payer: Self-pay | Admitting: Internal Medicine

## 2013-03-23 ENCOUNTER — Encounter: Payer: Self-pay | Admitting: Internal Medicine

## 2013-03-23 ENCOUNTER — Ambulatory Visit (INDEPENDENT_AMBULATORY_CARE_PROVIDER_SITE_OTHER): Payer: 59 | Admitting: Internal Medicine

## 2013-03-23 VITALS — BP 118/78 | HR 64 | Temp 98.0°F | Ht 67.0 in | Wt 210.0 lb

## 2013-03-23 DIAGNOSIS — M7712 Lateral epicondylitis, left elbow: Secondary | ICD-10-CM

## 2013-03-23 DIAGNOSIS — M7710 Lateral epicondylitis, unspecified elbow: Secondary | ICD-10-CM

## 2013-03-23 MED ORDER — DICLOFENAC SODIUM 1 % TD GEL: 2.0000 g | Freq: Two times a day (BID) | TRANSDERMAL | Status: DC | PRN

## 2013-03-23 NOTE — Progress Notes (Signed)
  Subjective:    Patient ID: Jason Phillips, male    DOB: 31-Dec-1980, 32 y.o.   MRN: [id number]  HPI  patient is here today for continued complains of L elbow pain  Onset 3 months ago precipitated by overuse at work No swelling No direct trauma   Past Medical History  Diagnosis Date  . ALLERGIC RHINITIS   . DEPRESSION   . DERMATITIS, ATOPIC   . GERD   . Headache   . 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 HYPERLIPIDEMIA   . MOOD DISORDER   . Lactose intolerance     Review of Systems Respiratory: Negative for cough and shortness of breath.   Cardiovascular: Negative for chest pain or palpitations.  Skin: Negative for rash.       Objective:   Physical Exam  BP 118/78  Pulse 64  Temp(Src) 98 F (36.7 C) (Oral)  Ht 5\' 7"  (1.702 m)  Wt 210 lb (95.255 kg)  BMI 32.88 kg/m2  SpO2 97% Wt Readings from Last 3 Encounters:  03/23/13 210 lb (95.255 kg)  01/21/13 208 lb 14.4 oz (94.756 kg)  10/22/11 203 lb 1.9 oz (92.135 kg)   Constitutional:  He is overweight but appears well-developed and well-nourished. No distress.  Neck: Normal range of motion. Neck supple. No JVD present. No thyromegaly present.  Cardiovascular: Normal rate, regular rhythm and normal heart sounds.  No murmur heard. no BLE edema Pulmonary/Chest: Effort normal and breath sounds normal. No respiratory distress. no wheezes. Musculoskeletal: Normal range of motion. Patient exhibits no gross deformities. L elbow with lateral epicondylitis, no swelling   Skin: Skin is warm and dry.  No erythema or ulceration.  Psychiatric: he has a normal mood and affect. behavior is normal. Judgment and thought content normal.   Lab Results  Component Value Date   WBC 6.4 01/15/2013   HGB 13.2 01/15/2013   HCT 38.7* 01/15/2013   PLT 328.0 01/15/2013   GLUCOSE 94 01/15/2013   CHOL 177 01/15/2013   TRIG 118.0 01/15/2013   HDL 38.50* 01/15/2013   LDLDIRECT 110.2 01/15/2013   LDLCALC 115* 01/15/2013   ALT 28  01/15/2013   AST 27 01/15/2013   NA 142 01/15/2013   K 3.9 01/15/2013   CL 110 01/15/2013   CREATININE 0.8 01/15/2013   BUN 16 01/15/2013   CO2 25 01/15/2013   TSH 1.62 01/15/2013   INR 1.0 03/28/2008   Procedure Note: Lateral epicondylitis injection for chronic tendinopathy of common extensor tendon the patient elects to proceed after verbal consent is obtained. Injection for pain control is pursued at this time as patient has failed conservative measures. the patient informed of possible risks and complications prior to procedure. Landmarks of anatomy identified, using sterile technique throughout, patient is injected with 1:3 10 mg depomedrol: 1% Xylocaine into the tendon enthesis. the patient tolerated the procedure well. Ice 24-48h, heat thereafter as needed instructions aftercare provided.       Assessment & Plan:    L lateral epicondylitis - has used OTC ibuprofen without improvement -  01/2013 rx Voltaren gel and elbow band (OTC), completed same, still unimproved Offered refer to ortho - pt requests injection here as prior PCP treated R elbow symptoms for same without ortho refer -  Steroid injection as above - tolerated well; educated on same, to call if worse or unimproved

## 2013-03-23 NOTE — Patient Instructions (Signed)
It was good to see you today. We have injected your elbow today with steroid and Xylocaine. Keep ice on this area every 2 hours for 10 minutes today, then as needed. Avoid overuse with this arm including no lifting, no twisting for next 5-7 days Medications reviewed and updated - refill as discussed Continue to use Voltaren gel on your elbow 2-4x/day as needed for pain -Your prescription(s) have been submitted to your pharmacy. Please take as directed and contact our office if you believe you are having problem(s) with the medication(s). Also use "tennis elbow band" everyday as discussed to help elbow pain       Lateral Epicondylitis (Tennis Elbow) Lateral epicondylitis involves inflammation and pain around the outer portion of the elbow. The pain is caused by inflammation of the tendons in the forearm that bring back (extend) the wrist. Lateral epicondylittis is also called tennis elbow, because it is very common in tennis players. However, it may occur in any individual who extends the wrist repetitively. If lateral epicondylitis is left untreated, it may become a chronic problem. SYMPTOMS    Pain, tenderness, and inflammation on the outer (lateral) side of the elbow.   Pain or weakness with gripping activities.   Pain that increases with wrist twisting motions (playing tennis, using a screwdriver, opening a door or a jar).   Pain with lifting objects, including a coffee cup.  CAUSES   Lateral epicondylitis is caused by inflammation of the tendons that extend the wrist. Causes of injury may include:  Repetitive stress and strain on the muscles and tendons that extend the wrist.   Sudden change in activity level or intensity.   Incorrect grip in racquet sports.   Incorrect grip size of racquet (often too large).   Incorrect hitting position or technique (usually backhand, leading with the elbow).   Using a racket that is too heavy.  RISK INCREASES WITH:  Sports or occupations  that require repetitive and/or strenuous forearm and wrist movements (tennis, squash, racquetball, carpentry).   Poor wrist and forearm strength and flexibility.   Failure to warm up properly before activity.   Resuming activity before healing, rehabilitation, and conditioning are complete.  PREVENTION    Warm up and stretch properly before activity.   Maintain physical fitness:   Strength, flexibility, and endurance.   Cardiovascular fitness.   Wear and use properly fitted equipment.   Learn and use proper technique and have a coach correct improper technique.   Wear a tennis elbow (counterforce) brace.  PROGNOSIS   The course of this condition depends on the degree of the injury. If treated properly, acute cases (symptoms lasting less than 4 weeks) are often resolved in 2 to 6 weeks. Chronic (longer lasting cases) often resolve in 3 to 6 months, but may require physical therapy. RELATED COMPLICATIONS    Frequently recurring symptoms, resulting in a chronic problem. Properly treating the problem the first time decreases frequency of recurrence.   Chronic inflammation, scarring tendon degeneration, and partial tendon tear, requiring surgery.   Delayed healing or resolution of symptoms.  TREATMENT   Treatment first involves the use of ice and medicine, to reduce pain and inflammation. Strengthening and stretching exercises may help reduce discomfort, if performed regularly. These exercises may be performed at home, if the condition is an acute injury. Chronic cases may require a referral to a physical therapist for evaluation and treatment. Your caregiver may advise a corticosteroid injection, to help reduce inflammation. Rarely, surgery is needed. MEDICATION  If pain medicine is needed, nonsteroidal anti-inflammatory medicines (aspirin and ibuprofen), or other minor pain relievers (acetaminophen), are often advised.   Do not take pain medicine for 7 days before surgery.    Prescription pain relievers may be given, if your caregiver thinks they are needed. Use only as directed and only as much as you need.   Corticosteroid injections may be recommended. These injections should be reserved only for the most severe cases, because they can only be given a certain number of times.  HEAT AND COLD  Cold treatment (icing) should be applied for 10 to 15 minutes every 2 to 3 hours for inflammation and pain, and immediately after activity that aggravates your symptoms. Use ice packs or an ice massage.   Heat treatment may be used before performing stretching and strengthening activities prescribed by your caregiver, physical therapist, or athletic trainer. Use a heat pack or a warm water soak.  SEEK MEDICAL CARE IF: Symptoms get worse or do not improve in 2 weeks, despite treatment.

## 2013-10-22 ENCOUNTER — Other Ambulatory Visit: Payer: Self-pay

## 2014-01-04 ENCOUNTER — Encounter: Payer: Self-pay | Admitting: Internal Medicine

## 2014-01-04 ENCOUNTER — Ambulatory Visit (INDEPENDENT_AMBULATORY_CARE_PROVIDER_SITE_OTHER): Payer: 59 | Admitting: Internal Medicine

## 2014-01-04 ENCOUNTER — Other Ambulatory Visit (INDEPENDENT_AMBULATORY_CARE_PROVIDER_SITE_OTHER): Payer: 59

## 2014-01-04 VITALS — BP 112/70 | HR 95 | Temp 100.2°F | Wt 215.0 lb

## 2014-01-04 DIAGNOSIS — R945 Abnormal results of liver function studies: Secondary | ICD-10-CM

## 2014-01-04 DIAGNOSIS — R7989 Other specified abnormal findings of blood chemistry: Secondary | ICD-10-CM

## 2014-01-04 DIAGNOSIS — J069 Acute upper respiratory infection, unspecified: Secondary | ICD-10-CM

## 2014-01-04 DIAGNOSIS — E785 Hyperlipidemia, unspecified: Secondary | ICD-10-CM

## 2014-01-04 DIAGNOSIS — IMO0001 Reserved for inherently not codable concepts without codable children: Secondary | ICD-10-CM

## 2014-01-04 LAB — HEPATIC FUNCTION PANEL
ALBUMIN: 4.2 g/dL (ref 3.5–5.2)
ALT: 88 U/L — ABNORMAL HIGH (ref 0–53)
AST: 73 U/L — AB (ref 0–37)
Alkaline Phosphatase: 47 U/L (ref 39–117)
Bilirubin, Direct: 0.1 mg/dL (ref 0.0–0.3)
Total Bilirubin: 0.7 mg/dL (ref 0.3–1.2)
Total Protein: 7.6 g/dL (ref 6.0–8.3)

## 2014-01-04 LAB — URINALYSIS, ROUTINE W REFLEX MICROSCOPIC
Bilirubin Urine: NEGATIVE
Leukocytes, UA: NEGATIVE
Nitrite: NEGATIVE
TOTAL PROTEIN, URINE-UPE24: NEGATIVE
URINE GLUCOSE: NEGATIVE
UROBILINOGEN UA: 0.2 (ref 0.0–1.0)
pH: 6 (ref 5.0–8.0)

## 2014-01-04 LAB — CBC WITH DIFFERENTIAL/PLATELET
BASOS ABS: 0 10*3/uL (ref 0.0–0.1)
BASOS PCT: 0.3 % (ref 0.0–3.0)
EOS PCT: 1.1 % (ref 0.0–5.0)
Eosinophils Absolute: 0.1 10*3/uL (ref 0.0–0.7)
HCT: 36.9 % — ABNORMAL LOW (ref 39.0–52.0)
Hemoglobin: 12.9 g/dL — ABNORMAL LOW (ref 13.0–17.0)
LYMPHS ABS: 3 10*3/uL (ref 0.7–4.0)
LYMPHS PCT: 46.6 % — AB (ref 12.0–46.0)
MCHC: 34.9 g/dL (ref 30.0–36.0)
MCV: 86 fl (ref 78.0–100.0)
Monocytes Absolute: 0.6 10*3/uL (ref 0.1–1.0)
Monocytes Relative: 9.5 % (ref 3.0–12.0)
NEUTROS PCT: 42.5 % — AB (ref 43.0–77.0)
Neutro Abs: 2.8 10*3/uL (ref 1.4–7.7)
PLATELETS: 272 10*3/uL (ref 150.0–400.0)
RBC: 4.28 Mil/uL (ref 4.22–5.81)
RDW: 12.8 % (ref 11.5–14.6)
WBC: 6.5 10*3/uL (ref 4.5–10.5)

## 2014-01-04 LAB — BASIC METABOLIC PANEL
BUN: 11 mg/dL (ref 6–23)
CALCIUM: 9.2 mg/dL (ref 8.4–10.5)
CO2: 25 meq/L (ref 19–32)
Chloride: 104 mEq/L (ref 96–112)
Creatinine, Ser: 0.8 mg/dL (ref 0.4–1.5)
GFR: 112.42 mL/min (ref >60.00)
Glucose, Bld: 133 mg/dL — ABNORMAL HIGH (ref 70–99)
Potassium: 3.5 mEq/L (ref 3.5–5.1)
SODIUM: 138 meq/L (ref 135–145)

## 2014-01-04 LAB — CK: Total CK: 104 U/L (ref 7–232)

## 2014-01-04 NOTE — Assessment & Plan Note (Signed)
On statin and fenofibrate - dose increased 10/2011 temporary hold statin with acute viral illness The patient is asked to make an attempt to improve diet and exercise patterns to aid in medical management of this problem.

## 2014-01-04 NOTE — Patient Instructions (Signed)
It was good to see you today.  If you develop worsening symptoms or fever, call and we can reconsider antibiotics, but it does not appear necessary to use antibiotics at this time.  Test(s) ordered today. Your results will be released to MyChart (or called to you) after review, usually within 72hours after test completion. If any changes need to be made, you will be notified at that same time.  Use tylenol for aches, pain and fever symptoms as discussed  Hydrate, rest and call if worse or unimproved

## 2014-01-04 NOTE — Progress Notes (Signed)
Subjective:    Patient ID: Jason Phillips, male    DOB: [birth year], 33 y.o.   MRN: [id number]  Muscle Pain Associated symptoms include a fever and headaches (chronic). Pertinent negatives include no chest pain, constipation, diarrhea, nausea, rash, shortness of breath, vomiting or wheezing.    Patient here today with complaints of muscle/joint pain and fever for 4 days. Also c/o dark urine this morning.  Has been drinking coke for hydration.  No cough, head congestion, nasal drainage, or other symptoms.  Taking Ibuprofen prn for muscle aches which is giving some relief.   Past Medical History  Diagnosis Date  . ALLERGIC RHINITIS   . DEPRESSION   . DERMATITIS, ATOPIC   . GERD   . Headache(784.0)   . 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 HYPERLIPIDEMIA   . MOOD DISORDER   . Lactose intolerance     Review of Systems  Constitutional: Positive for fever. Negative for activity change and appetite change.  HENT: Negative for congestion, rhinorrhea, sinus pressure and sore throat.   Eyes: Negative for discharge, redness and itching.  Respiratory: Negative for chest tightness, shortness of breath and wheezing.   Cardiovascular: Negative for chest pain, palpitations and leg swelling.  Gastrointestinal: Negative for nausea, vomiting, diarrhea and constipation.  Musculoskeletal: Positive for myalgias. Negative for neck pain and neck stiffness.  Skin: Negative for rash and wound.  Neurological: Positive for headaches (chronic).       Objective:   Physical Exam  Constitutional: He is oriented to person, place, and time. He appears well-developed and well-nourished. No distress.  HENT:  Head: Normocephalic and atraumatic.  Right Ear: External ear normal.  Left Ear: External ear normal.  Nose: Nose normal.  Mouth/Throat: Oropharynx is clear and moist. No oropharyngeal exudate.  Eyes: Conjunctivae and EOM are normal. Pupils are equal, round, and reactive to light.  Right eye exhibits no discharge. Left eye exhibits no discharge.  Neck: Normal range of motion. Neck supple. No thyromegaly present.  Cardiovascular: Normal rate, regular rhythm and normal heart sounds.   No murmur heard. Pulmonary/Chest: Breath sounds normal. Tachypnea noted.  Abdominal: Soft. Bowel sounds are normal.  Musculoskeletal: Normal range of motion.  Lymphadenopathy:    He has no cervical adenopathy.  Neurological: He is alert and oriented to person, place, and time.  Skin: Skin is warm and dry. No rash noted. He is not diaphoretic.  Psychiatric: He has a normal mood and affect. His behavior is normal. Judgment and thought content normal.    BP 112/70  Pulse 95  Temp(Src) 100.2 F (37.9 C) (Oral)  Wt 215 lb (97.523 kg)  SpO2 97% BP Readings from Last 3 Encounters:  01/04/14 112/70  03/23/13 118/78  01/21/13 120/74   Wt Readings from Last 3 Encounters:  01/04/14 215 lb (97.523 kg)  03/23/13 210 lb (95.255 kg)  01/21/13 208 lb 14.4 oz (94.756 kg)   Lab Results  Component Value Date   WBC 6.4 01/15/2013   HGB 13.2 01/15/2013   HCT 38.7* 01/15/2013   PLT 328.0 01/15/2013   GLUCOSE 94 01/15/2013   CHOL 177 01/15/2013   TRIG 118.0 01/15/2013   HDL 38.50* 01/15/2013   LDLDIRECT 110.2 01/15/2013   LDLCALC 115* 01/15/2013   ALT 28 01/15/2013   AST 27 01/15/2013   NA 142 01/15/2013   K 3.9 01/15/2013   CL 110 01/15/2013   CREATININE 0.8 01/15/2013   BUN 16 01/15/2013   CO2 25  01/15/2013   TSH 1.62 01/15/2013   INR 1.0 03/28/2008        Assessment & Plan:   Myalgias -suspect related to viral illness given low grade fever, but few other URI symptoms  Check CBC, LFT, urinalysis, CK and treatment to rule out statin side effects and look for infection  Symptomatically advised with Tylenol and aggressive hydration  Okay to hold the statin until acute symptoms resolved

## 2014-01-04 NOTE — Progress Notes (Signed)
Pre-visit discussion using our clinic review tool. No additional management support is needed unless otherwise documented below in the visit note.  

## 2014-01-05 ENCOUNTER — Encounter: Payer: Self-pay | Admitting: Internal Medicine

## 2014-01-05 DIAGNOSIS — R739 Hyperglycemia, unspecified: Secondary | ICD-10-CM

## 2014-01-05 NOTE — Addendum Note (Signed)
Addended by: Rene PaciLESCHBER, Cari Burgo A on: 01/05/2014 08:41 AM   Modules accepted: Orders

## 2014-01-06 ENCOUNTER — Other Ambulatory Visit (INDEPENDENT_AMBULATORY_CARE_PROVIDER_SITE_OTHER): Payer: 59

## 2014-01-06 ENCOUNTER — Encounter: Payer: Self-pay | Admitting: Internal Medicine

## 2014-01-06 DIAGNOSIS — IMO0001 Reserved for inherently not codable concepts without codable children: Secondary | ICD-10-CM

## 2014-01-06 DIAGNOSIS — R945 Abnormal results of liver function studies: Secondary | ICD-10-CM

## 2014-01-06 DIAGNOSIS — J069 Acute upper respiratory infection, unspecified: Secondary | ICD-10-CM

## 2014-01-06 DIAGNOSIS — R7989 Other specified abnormal findings of blood chemistry: Secondary | ICD-10-CM

## 2014-01-06 DIAGNOSIS — E785 Hyperlipidemia, unspecified: Secondary | ICD-10-CM

## 2014-01-06 LAB — HEPATIC FUNCTION PANEL
ALBUMIN: 4.1 g/dL (ref 3.5–5.2)
ALK PHOS: 54 U/L (ref 39–117)
ALT: 142 U/L — ABNORMAL HIGH (ref 0–53)
AST: 120 U/L — ABNORMAL HIGH (ref 0–37)
BILIRUBIN TOTAL: 0.9 mg/dL (ref 0.3–1.2)
Bilirubin, Direct: 0.3 mg/dL (ref 0.0–0.3)
Total Protein: 7.9 g/dL (ref 6.0–8.3)

## 2014-01-06 NOTE — Addendum Note (Signed)
Addended by: Rene PaciLESCHBER, Melayna Robarts A on: 01/06/2014 01:12 PM   Modules accepted: Orders

## 2014-01-06 NOTE — Addendum Note (Signed)
Addended by: Rene PaciLESCHBER, VALERIE A on: 01/06/2014 05:45 PM   Modules accepted: Orders

## 2014-01-07 ENCOUNTER — Encounter: Payer: Self-pay | Admitting: Internal Medicine

## 2014-01-07 LAB — EPSTEIN-BARR VIRUS VCA ANTIBODY PANEL
EBV EA IgG: <5 U/mL (ref <9.0)
EBV VCA IgG: 13.2 U/mL (ref <18.0)
EBV VCA IgM: 14.3 U/mL (ref <36.0)

## 2014-01-11 ENCOUNTER — Encounter: Payer: Self-pay | Admitting: Internal Medicine

## 2014-01-11 ENCOUNTER — Other Ambulatory Visit (INDEPENDENT_AMBULATORY_CARE_PROVIDER_SITE_OTHER): Payer: 59

## 2014-01-11 DIAGNOSIS — R945 Abnormal results of liver function studies: Secondary | ICD-10-CM

## 2014-01-11 DIAGNOSIS — R7309 Other abnormal glucose: Secondary | ICD-10-CM

## 2014-01-11 DIAGNOSIS — R739 Hyperglycemia, unspecified: Secondary | ICD-10-CM

## 2014-01-11 DIAGNOSIS — J069 Acute upper respiratory infection, unspecified: Secondary | ICD-10-CM

## 2014-01-11 DIAGNOSIS — R7989 Other specified abnormal findings of blood chemistry: Secondary | ICD-10-CM

## 2014-01-11 DIAGNOSIS — IMO0001 Reserved for inherently not codable concepts without codable children: Secondary | ICD-10-CM

## 2014-01-11 DIAGNOSIS — E785 Hyperlipidemia, unspecified: Secondary | ICD-10-CM

## 2014-01-11 LAB — HEPATIC FUNCTION PANEL
ALK PHOS: 59 U/L (ref 39–117)
ALT: 119 U/L — AB (ref 0–53)
AST: 58 U/L — ABNORMAL HIGH (ref 0–37)
Albumin: 4 g/dL (ref 3.5–5.2)
BILIRUBIN DIRECT: 0.2 mg/dL (ref 0.0–0.3)
Total Bilirubin: 0.7 mg/dL (ref 0.3–1.2)
Total Protein: 7.8 g/dL (ref 6.0–8.3)

## 2014-01-11 LAB — HEMOGLOBIN A1C: Hgb A1c MFr Bld: 5.8 % (ref 4.6–6.5)

## 2014-01-12 LAB — LYME, IGM, EARLY TEST/REFLEX: LYME DISEASE AB, QUANT, IGM: <0.8 index (ref 0.00–0.79)

## 2014-01-13 ENCOUNTER — Encounter: Payer: Self-pay | Admitting: Internal Medicine

## 2014-01-20 ENCOUNTER — Other Ambulatory Visit: Payer: Self-pay | Admitting: Internal Medicine

## 2014-02-03 ENCOUNTER — Encounter: Payer: Self-pay | Admitting: Internal Medicine

## 2014-02-03 ENCOUNTER — Ambulatory Visit (INDEPENDENT_AMBULATORY_CARE_PROVIDER_SITE_OTHER): Payer: 59 | Admitting: Internal Medicine

## 2014-02-03 VITALS — BP 120/78 | HR 94 | Temp 98.9°F | Ht 67.0 in | Wt 210.6 lb

## 2014-02-03 DIAGNOSIS — E785 Hyperlipidemia, unspecified: Secondary | ICD-10-CM

## 2014-02-03 DIAGNOSIS — Z0000 Encounter for general adult medical examination without abnormal findings: Secondary | ICD-10-CM

## 2014-02-03 DIAGNOSIS — R7989 Other specified abnormal findings of blood chemistry: Secondary | ICD-10-CM

## 2014-02-03 DIAGNOSIS — R945 Abnormal results of liver function studies: Secondary | ICD-10-CM

## 2014-02-03 NOTE — Progress Notes (Signed)
Pre-visit discussion using our clinic review tool. No additional management support is needed unless otherwise documented below in the visit note.  

## 2014-02-03 NOTE — Progress Notes (Signed)
Subjective:    Patient ID: Jason Phillips, male    DOB: [birth year], 33 y.o.   MRN: [id number]  HPI  Patent here to day for annual physical.  Patient feels well overall.  Also reviewed chronic medical issues and interval medical events:  Dyslipidemia: d/c statin during recent viral illness/myalgias.  Reports complete resolution of sxs.  Continues to take fenofibrate.  Mood disorder w/ hx of anger mgmt issues- Denies depression symptoms.  Mood controlled on current dose of prozac.  Has never been to counseling and declined this as an option today.  Esophageal Reflux:  Occasional w/ heavy foods, well controlled with Pepcid  Past Medical History  Diagnosis Date  . ALLERGIC RHINITIS   . DEPRESSION   . DERMATITIS, ATOPIC   . GERD   . Headache(784.0)   . 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 HYPERLIPIDEMIA   . MOOD DISORDER   . Lactose intolerance    Family History  Problem Relation Age of Onset  . Arthritis Other   . Diabetes Other   . Hyperlipidemia Other   . Hypertension Other   . Heart disease Other   . Stroke Other    History   Social History  . Marital Status: Married    Spouse Name: N/A    Number of Children: N/A  . Years of Education: N/A   Occupational History  . Not on file.   Social History Main Topics  . Smoking status: Never Smoker   . Smokeless tobacco: Not on file     Comment: Married, lives with spouse (who is pharmacist at [facility] [employer]. Works in TEFL teacher  . Alcohol Use: No     Comment: occassional  . Drug Use: No  . Sexual Activity: Yes   Other Topics Concern  . Not on file   Social History Narrative   Married, lives with spouse (who is [employer] pharmacist)   Self employed - Teacher, English as a foreign language    Review of Systems  Constitutional: Negative for fever, chills, fatigue and unexpected weight change.  HENT: Negative for congestion and sinus pressure.   Respiratory: Negative for chest tightness and shortness of breath.     Cardiovascular: Negative for chest pain and palpitations.  Gastrointestinal: Negative for nausea, vomiting, diarrhea and constipation.  Genitourinary: Negative for hematuria, penile swelling, scrotal swelling, difficulty urinating and testicular pain.  Musculoskeletal: Positive for back pain (Soreness L side hip from previous injury). Negative for arthralgias and joint swelling.  Skin: Negative for rash and wound.  Neurological: Negative for dizziness, syncope, facial asymmetry and headaches.  Psychiatric/Behavioral: Negative for confusion and self-injury. The patient is not nervous/anxious.        Objective:   Physical Exam  Constitutional: He is oriented to person, place, and time. He appears well-developed and well-nourished.  HENT:  Head: Normocephalic and atraumatic.  Mouth/Throat: Oropharynx is clear and moist.  Deafness since birth R ear.  Eyes: Conjunctivae and EOM are normal. Pupils are equal, round, and reactive to light. Right eye exhibits no discharge. Left eye exhibits no discharge. No scleral icterus. Right eye exhibits no nystagmus. Left eye exhibits no nystagmus.  Fundoscopic exam:      The right eye shows red reflex.       The left eye shows red reflex.  Neck: Normal range of motion. Neck supple. No JVD present. No tracheal deviation present. No thyromegaly present.  Cardiovascular: Normal rate, regular rhythm, normal heart sounds and intact distal pulses.  Exam reveals no gallop and no friction rub.   No murmur heard. Pulmonary/Chest: Effort normal and breath sounds normal. No respiratory distress. He has no wheezes. He has no rales. He exhibits no tenderness.  CTA bilaterally A/P  Abdominal: Soft. Bowel sounds are normal. He exhibits no distension and no mass. There is no tenderness. There is no rebound and no guarding.  Genitourinary:  Deferred   Musculoskeletal: Normal range of motion. He exhibits no edema and no tenderness.  Lymphadenopathy:    He has no  cervical adenopathy.  Neurological: He is alert and oriented to person, place, and time. No cranial nerve deficit.  Skin: Skin is warm and dry. No rash noted.  Psychiatric: He has a normal mood and affect. His behavior is normal. Judgment and thought content normal.   Filed Vitals:   02/03/14 1444  BP: 120/78  Pulse: 94  Temp: 98.9 F (37.2 C)   Wt Readings from Last 3 Encounters:  02/03/14 210 lb 9.6 oz (95.528 kg)  01/04/14 215 lb (97.523 kg)  03/23/13 210 lb (95.255 kg)    Lab Results  Component Value Date   WBC 6.5 01/04/2014   HGB 12.9* 01/04/2014   HCT 36.9* 01/04/2014   PLT 272.0 01/04/2014   GLUCOSE 133* 01/04/2014   CHOL 177 01/15/2013   TRIG 118.0 01/15/2013   HDL 38.50* 01/15/2013   LDLDIRECT 110.2 01/15/2013   LDLCALC 115* 01/15/2013   ALT 119* 01/11/2014   AST 58* 01/11/2014   NA 138 01/04/2014   K 3.5 01/04/2014   CL 104 01/04/2014   CREATININE 0.8 01/04/2014   BUN 11 01/04/2014   CO2 25 01/04/2014   TSH 1.62 01/15/2013   INR 1.0 03/28/2008   HGBA1C 5.8 01/11/2014     Assessment & Plan:  CPX - v70.0 - Patient has been counseled on age-appropriate routine health concerns for screening and prevention.  These are reviewed and up-to-date.  Diet/weight mgmt and PPE for work also discussed with patient.    Immunizations are up-to-date or declined. Labs scheduled.  Avis EpleyH.C Martensen, PA-S  I have personally reviewed this case with PA student. I also personally examined this patient. I agree with history and findings as documented above. I reviewed, discussed and approve of the assessment and plan as listed above. Rene PaciValerie Leschber, MD

## 2014-02-03 NOTE — Patient Instructions (Addendum)
It was good to see you today.  We have reviewed your prior records including labs and tests today  Health Maintenance reviewed - you declined the flu shot this year - please consider doing this every Fall. All other recommended immunizations and age-appropriate screenings are up-to-date.  Test(s) ordered today. Your results will be released to MyChart (or called to you) after review, usually within 72hours after test completion. If any changes need to be made, you will be notified at that same time.  Medications reviewed and updated, no changes recommended at this time. If normal liver tests will resume simvastatin and recheck liver tests 6 weeks after that  Please schedule followup in 6 months for exam and labs, call sooner if problems.  Health Maintenance, Males A healthy lifestyle and preventative care can promote health and wellness.  Maintain regular health, dental, and eye exams.  Eat a healthy diet. Foods like vegetables, fruits, whole grains, low-fat dairy products, and lean protein foods contain the nutrients you need and are low in calories. Decrease your intake of foods high in solid fats, added sugars, and salt. Get information about a proper diet from your health care provider, if necessary.  Regular physical exercise is one of the most important things you can do for your health. Most adults should get at least 150 minutes of moderate-intensity exercise (any activity that increases your heart rate and causes you to sweat) each week. In addition, most adults need muscle-strengthening exercises on 2 or more days a week.   Maintain a healthy weight. The body mass index (BMI) is a screening tool to identify possible weight problems. It provides an estimate of body fat based on height and weight. Your health care provider can find your BMI and can help you achieve or maintain a healthy weight. For males 20 years and older:  A BMI below 18.5 is considered underweight.  A BMI of  18.5 to 24.9 is normal.  A BMI of 25 to 29.9 is considered overweight.  A BMI of 30 and above is considered obese.  Maintain normal blood lipids and cholesterol by exercising and minimizing your intake of saturated fat. Eat a balanced diet with plenty of fruits and vegetables. Blood tests for lipids and cholesterol should begin at age 76 and be repeated every 5 years. If your lipid or cholesterol levels are high, you are over 50, or you are at high risk for heart disease, you may need your cholesterol levels checked more frequently.Ongoing high lipid and cholesterol levels should be treated with medicines, if diet and exercise are not working.  If you smoke, find out from your health care provider how to quit. If you do not use tobacco, do not start.  Lung cancer screening is recommended for adults aged 76 80 years who are at high risk for developing lung cancer because of a history of smoking. A yearly low-dose CT scan of the lungs is recommended for people who have at least a 30-pack-year history of smoking and are a current smoker or have quit within the past 15 years. A pack year of smoking is smoking an average of 1 pack of cigarettes a day for 1 year (for example, a 30-pack-year history of smoking could mean smoking 1 pack a day for 30 years or 2 packs a day for 15 years). Yearly screening should continue until the smoker has stopped smoking for at least 15 years. Yearly screening should be stopped for people who develop a health problem that would  prevent them from having lung cancer treatment.  If you choose to drink alcohol, do not have more than 2 drinks per day. One drink is considered to be 12 oz (360 mL) of beer, 5 oz (150 mL) of wine, or 1.5 oz (45 mL) of liquor.  Avoid use of street drugs. Do not share needles with anyone. Ask for help if you need support or instructions about stopping the use of drugs.  High blood pressure causes heart disease and increases the risk of stroke. Blood  pressure should be checked at least every 1 2 years. Ongoing high blood pressure should be treated with medicines if weight loss and exercise are not effective.  If you are 103 35 years old, ask your health care provider if you should take aspirin to prevent heart disease.  Diabetes screening involves taking a blood sample to check your fasting blood sugar level. This should be done once every 3 years after age 63, if you are at a normal weight and without risk factors for diabetes. Testing should be considered at a younger age or be carried out more frequently if you are overweight and have at least 1 risk factor for diabetes.  Colorectal cancer can be detected and often prevented. Most routine colorectal cancer screening begins at the age of 53 and continues through age 72. However, your health care provider may recommend screening at an earlier age if you have risk factors for colon cancer. On a yearly basis, your health care provider may provide home test kits to check for hidden blood in the stool. A small camera at the end of a tube may be used to directly examine the colon (sigmoidoscopy or colonoscopy) to detect the earliest forms of colorectal cancer. Talk to your health care provider about this at age 26, when routine screening begins. A direct exam of the colon should be repeated every 5 10 years through age 72, unless early forms of pre-cancerous polyps or small growths are found.  People who are at an increased risk for hepatitis B should be screened for this virus. You are considered at high risk for hepatitis B if:  You were born in a country where hepatitis B occurs often. Talk with your health care provider about which countries are considered high-risk.  Your parents were born in a high-risk country and you have not received a shot to protect against hepatitis B (hepatitis B vaccine).  You have HIV or AIDS.  You use needles to inject street drugs.  You live with, or have sex with,  someone who has hepatitis B.  You are a man who has sex with other men (MSM).  You get hemodialysis treatment.  You take certain medicines for conditions like cancer, organ transplantation, and autoimmune conditions.  Hepatitis C blood testing is recommended for all people born from [birth year] through 1967 and any individual with known risk factors for hepatitis C.  Healthy men should no longer receive prostate-specific antigen (PSA) blood tests as part of routine cancer screening. Talk to your health care provider about prostate cancer screening.  Testicular cancer screening is not recommended for adolescents or adult males who have no symptoms. Screening includes self-exam, a health care provider exam, and other screening tests. Consult with your health care provider about any symptoms you have or any concerns you have about testicular cancer.  Practice safe sex. Use condoms and avoid high-risk sexual practices to reduce the spread of sexually transmitted infections (STIs).  Use sunscreen. Apply sunscreen  liberally and repeatedly throughout the day. You should seek shade when your shadow is shorter than you. Protect yourself by wearing long sleeves, pants, a wide-brimmed hat, and sunglasses year round, whenever you are outdoors.  Tell your health care provider of new moles or changes in moles, especially if there is a change in shape or color. Also tell your provider if a mole is larger than the size of a pencil eraser.  A one-time screening for abdominal aortic aneurysm (AAA) and surgical repair of large AAAs by ultrasound is recommended for men aged 665 75 years who are current or former smokers.  Stay current with your vaccines (immunizations). Document Released: 05/31/2008 Document Revised: 09/23/2013 Document Reviewed: 04/30/2011 [facility] Patient Information 2014 [employer] [place].

## 2014-02-03 NOTE — Progress Notes (Signed)
Subjective:    Patient ID: Jason Phillips, male    DOB: 11/14/1981, 33 y.o.   MRN: [id number]  HPI  patient is here today for annual physical. Patient feels well and has no complaints.  Also reviewed chronic medical issues and interval medical events:  Past Medical History  Diagnosis Date  . ALLERGIC RHINITIS   . DEPRESSION   . DERMATITIS, ATOPIC   . GERD   . Headache(784.0)   . 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 HYPERLIPIDEMIA   . MOOD DISORDER   . Lactose intolerance    Family History  Problem Relation Age of Onset  . Arthritis Other   . Diabetes Other   . Hyperlipidemia Other   . Hypertension Other   . Heart disease Other   . Stroke Other    History  Substance Use Topics  . Smoking status: Never Smoker   . Smokeless tobacco: Not on file     Comment: Married, lives with spouse (who is [facility]. Works in TEFL teacherself-employed lawn maintence  . Alcohol Use: No     Comment: occassional    Review of Systems  Constitutional: Negative for fever, activity change, appetite change, fatigue and unexpected weight change.  Respiratory: Negative for cough, chest tightness, shortness of breath and wheezing.   Cardiovascular: Negative for chest pain, palpitations and leg swelling.  Neurological: Negative for dizziness, weakness and headaches.  Psychiatric/Behavioral: Negative for dysphoric mood. The patient is not nervous/anxious.   All other systems reviewed and are negative.       Objective:   Physical Exam  BP 120/78  Pulse 94  Temp(Src) 98.9 F (37.2 C) (Oral)  Ht 5\' 7"  (1.702 m)  Wt 210 lb 9.6 oz (95.528 kg)  BMI 32.98 kg/m2  SpO2 97% Wt Readings from Last 3 Encounters:  02/03/14 210 lb 9.6 oz (95.528 kg)  01/04/14 215 lb (97.523 kg)  03/23/13 210 lb (95.255 kg)   Constitutional: he appears well-developed and well-nourished. No distress.  HENT: Head: Normocephalic and atraumatic. Ears: B TMs ok, no erythema or effusion; Nose: Nose  normal. Mouth/Throat: Oropharynx is clear and moist. No oropharyngeal exudate.  Eyes: Conjunctivae and EOM are normal. Pupils are equal, round, and reactive to light. No scleral icterus.  Neck: Normal range of motion. Neck supple. No JVD present. No thyromegaly present.  Cardiovascular: Normal rate, regular rhythm and normal heart sounds.  No murmur heard. No BLE edema. Pulmonary/Chest: Effort normal and breath sounds normal. No respiratory distress. he has no wheezes.  Abdominal: Soft. Bowel sounds are normal. he exhibits no distension. There is no tenderness. no masses Musculoskeletal: Normal range of motion, no joint effusions. No gross deformities Neurological: he is alert and oriented to person, place, and time. No cranial nerve deficit. Coordination, balance, strength, speech and gait are normal.  Skin: Skin is warm and dry. No rash noted. No erythema.  Psychiatric: he has a normal mood and affect. behavior is normal. Judgment and thought content normal.  Lab Results  Component Value Date   WBC 6.5 01/04/2014   HGB 12.9* 01/04/2014   HCT 36.9* 01/04/2014   PLT 272.0 01/04/2014   GLUCOSE 133* 01/04/2014   CHOL 177 01/15/2013   TRIG 118.0 01/15/2013   HDL 38.50* 01/15/2013   LDLDIRECT 110.2 01/15/2013   LDLCALC 115* 01/15/2013   ALT 119* 01/11/2014   AST 58* 01/11/2014   NA 138 01/04/2014   K 3.5 01/04/2014   CL 104 01/04/2014  CREATININE 0.8 01/04/2014   BUN 11 01/04/2014   CO2 25 01/04/2014   TSH 1.62 01/15/2013   INR 1.0 03/28/2008   HGBA1C 5.8 01/11/2014    No results found.     Assessment & Plan:   CPX/v70.0 - Patient has been counseled on age-appropriate routine health concerns for screening and prevention. These are reviewed and up-to-date. Immunizations are up-to-date or declined. Labs ordered and reviewed.

## 2014-02-03 NOTE — Assessment & Plan Note (Signed)
On statin and fenofibrate - dose increased 10/2011 temporary hold statin with acute viral illness and inc LFTs mid 12/2013 Recheck now and resume same pending normalization with close follow up  The patient is asked to make an attempt to improve diet and exercise patterns to aid in medical management of this problem.

## 2014-02-04 ENCOUNTER — Other Ambulatory Visit (INDEPENDENT_AMBULATORY_CARE_PROVIDER_SITE_OTHER): Payer: 59

## 2014-02-04 DIAGNOSIS — E785 Hyperlipidemia, unspecified: Secondary | ICD-10-CM

## 2014-02-04 DIAGNOSIS — Z0000 Encounter for general adult medical examination without abnormal findings: Secondary | ICD-10-CM

## 2014-02-04 LAB — HEPATIC FUNCTION PANEL
ALT: 33 U/L (ref 0–53)
AST: 31 U/L (ref 0–37)
Albumin: 4.3 g/dL (ref 3.5–5.2)
Alkaline Phosphatase: 38 U/L — ABNORMAL LOW (ref 39–117)
Bilirubin, Direct: 0.1 mg/dL (ref 0.0–0.3)
Total Bilirubin: 0.8 mg/dL (ref 0.3–1.2)
Total Protein: 7.7 g/dL (ref 6.0–8.3)

## 2014-02-04 LAB — LIPID PANEL
Cholesterol: 245 mg/dL — ABNORMAL HIGH (ref 0–200)
HDL: 42.1 mg/dL (ref >39.00)
Total CHOL/HDL Ratio: 6
Triglycerides: 263 mg/dL — ABNORMAL HIGH (ref 0.0–149.0)
VLDL: 52.6 mg/dL — ABNORMAL HIGH (ref 0.0–40.0)

## 2014-02-04 LAB — URINALYSIS, ROUTINE W REFLEX MICROSCOPIC
BILIRUBIN URINE: NEGATIVE
Hgb urine dipstick: NEGATIVE
Ketones, ur: NEGATIVE
LEUKOCYTES UA: NEGATIVE
NITRITE: NEGATIVE
PH: 6 (ref 5.0–8.0)
RBC / HPF: NONE SEEN (ref 0–?)
Specific Gravity, Urine: 1.025 (ref 1.000–1.030)
Total Protein, Urine: NEGATIVE
Urine Glucose: NEGATIVE
Urobilinogen, UA: 0.2 (ref 0.0–1.0)

## 2014-02-04 LAB — BASIC METABOLIC PANEL
BUN: 13 mg/dL (ref 6–23)
CHLORIDE: 108 meq/L (ref 96–112)
CO2: 24 meq/L (ref 19–32)
Calcium: 9.5 mg/dL (ref 8.4–10.5)
Creatinine, Ser: 0.8 mg/dL (ref 0.4–1.5)
GFR: 126.31 mL/min (ref >60.00)
GLUCOSE: 91 mg/dL (ref 70–99)
POTASSIUM: 3.8 meq/L (ref 3.5–5.1)
SODIUM: 140 meq/L (ref 135–145)

## 2014-02-04 LAB — CBC WITH DIFFERENTIAL/PLATELET
BASOS ABS: 0 10*3/uL (ref 0.0–0.1)
Basophils Relative: 0.5 % (ref 0.0–3.0)
Eosinophils Absolute: 0.2 10*3/uL (ref 0.0–0.7)
Eosinophils Relative: 2.2 % (ref 0.0–5.0)
HEMATOCRIT: 38.4 % — AB (ref 39.0–52.0)
HEMOGLOBIN: 12.9 g/dL — AB (ref 13.0–17.0)
Lymphocytes Relative: 46.6 % — ABNORMAL HIGH (ref 12.0–46.0)
Lymphs Abs: 3.3 10*3/uL (ref 0.7–4.0)
MCHC: 33.7 g/dL (ref 30.0–36.0)
MCV: 88.7 fl (ref 78.0–100.0)
MONOS PCT: 7.2 % (ref 3.0–12.0)
Monocytes Absolute: 0.5 10*3/uL (ref 0.1–1.0)
Neutro Abs: 3.1 10*3/uL (ref 1.4–7.7)
Neutrophils Relative %: 43.5 % (ref 43.0–77.0)
Platelets: 340 10*3/uL (ref 150.0–400.0)
RBC: 4.33 Mil/uL (ref 4.22–5.81)
RDW: 12.8 % (ref 11.5–14.6)
WBC: 7 10*3/uL (ref 4.5–10.5)

## 2014-02-04 LAB — TSH: TSH: 2.51 u[IU]/mL (ref 0.35–5.50)

## 2014-02-04 LAB — LDL CHOLESTEROL, DIRECT: Direct LDL: 159.6 mg/dL

## 2014-02-15 ENCOUNTER — Encounter: Payer: Self-pay | Admitting: Internal Medicine

## 2014-02-22 MED ORDER — PRAVASTATIN SODIUM 40 MG PO TABS: 40.0000 mg | ORAL_TABLET | Freq: Every day | ORAL | Status: DC

## 2014-02-22 NOTE — Telephone Encounter (Signed)
Pls advise on email.../lmb 

## 2014-03-11 ENCOUNTER — Ambulatory Visit (INDEPENDENT_AMBULATORY_CARE_PROVIDER_SITE_OTHER): Payer: 59 | Admitting: Internal Medicine

## 2014-03-11 ENCOUNTER — Encounter: Payer: Self-pay | Admitting: Internal Medicine

## 2014-03-11 VITALS — BP 116/76 | HR 76 | Temp 98.0°F | Resp 16 | Wt 210.0 lb

## 2014-03-11 DIAGNOSIS — J309 Allergic rhinitis, unspecified: Secondary | ICD-10-CM | POA: Insufficient documentation

## 2014-03-11 DIAGNOSIS — K219 Gastro-esophageal reflux disease without esophagitis: Secondary | ICD-10-CM

## 2014-03-11 DIAGNOSIS — J019 Acute sinusitis, unspecified: Secondary | ICD-10-CM

## 2014-03-11 MED ORDER — METHYLPREDNISOLONE ACETATE 80 MG/ML IJ SUSP
80.0000 mg | Freq: Once | INTRAMUSCULAR | Status: AC
  Administered 2014-03-11: 80 mg via INTRAMUSCULAR

## 2014-03-11 MED ORDER — PROMETHAZINE-CODEINE 6.25-10 MG/5ML PO SYRP
5.0000 mL | ORAL_SOLUTION | ORAL | Status: DC | PRN
Start: 2014-03-11 — End: 2014-10-12

## 2014-03-11 MED ORDER — PSEUDOEPHEDRINE HCL ER 120 MG PO TB12: 120.0000 mg | ORAL_TABLET | Freq: Two times a day (BID) | ORAL | Status: DC | PRN

## 2014-03-11 MED ORDER — AMOXICILLIN-POT CLAVULANATE 875-125 MG PO TABS: 1.0000 | ORAL_TABLET | Freq: Two times a day (BID) | ORAL | Status: DC

## 2014-03-11 NOTE — Assessment & Plan Note (Signed)
netty pot Sudafed

## 2014-03-11 NOTE — Assessment & Plan Note (Signed)
Continue with current prescription therapy as reflected on the Med list.  

## 2014-03-11 NOTE — Progress Notes (Signed)
Pre visit review using our clinic review tool, if applicable. No additional management support is needed unless otherwise documented below in the visit note. 

## 2014-03-11 NOTE — Patient Instructions (Signed)
Use over-the-counter  "cold" medicines  such as "Tylenol cold" , "Advil cold",  "Mucinex" or" Mucinex D"  for cough and congestion.   Avoid decongestants if you have high blood pressure and use "Afrin" nasal spray for nasal congestion as directed instead. Use" Delsym" or" Robitussin" cough syrup varietis for cough.  You can use plain "Tylenol" or "Advil" for fever, chills and achyness.  Please, make an appointment if you are not better or if you're worse.  

## 2014-03-11 NOTE — Progress Notes (Signed)
   Subjective:    Patient ID: Jason Phillips, male    DOB: 09-May-1981, 33 y.o.   MRN: [id number]  URI  This is a new problem. The current episode started in the past 7 days. The problem has been gradually worsening. The maximum temperature recorded prior to his arrival was 101 - 101.9 F. The fever has been present for 5 days or more. Associated symptoms include coughing, rhinorrhea, sinus pain, a sore throat and swollen glands. Pertinent negatives include no chest pain or wheezing. He has tried acetaminophen for the symptoms. The treatment provided no relief.      Review of Systems  HENT: Positive for rhinorrhea and sore throat.   Respiratory: Positive for cough. Negative for wheezing.   Cardiovascular: Negative for chest pain.       Objective:   Physical Exam  Constitutional: He is oriented to person, place, and time. He appears well-developed. No distress.  NAD  HENT:  Mouth/Throat: Oropharynx is clear and moist.  swollen nares  Eyes: Conjunctivae are normal. Pupils are equal, round, and reactive to light.  Neck: Normal range of motion. No JVD present. No thyromegaly present.  Cardiovascular: Normal rate, regular rhythm, normal heart sounds and intact distal pulses.  Exam reveals no gallop and no friction rub.   No murmur heard. Pulmonary/Chest: Effort normal and breath sounds normal. No respiratory distress. He has no wheezes. He has no rales. He exhibits no tenderness.  Abdominal: Soft. Bowel sounds are normal. He exhibits no distension and no mass. There is no tenderness. There is no rebound and no guarding.  Musculoskeletal: Normal range of motion. He exhibits no edema and no tenderness.  Lymphadenopathy:    He has no cervical adenopathy.  Neurological: He is alert and oriented to person, place, and time. He has normal reflexes. No cranial nerve deficit. He exhibits normal muscle tone. He displays a negative Romberg sign. Coordination and gait normal.  No meningeal signs    Skin: Skin is warm and dry. No rash noted.  Psychiatric: He has a normal mood and affect. His behavior is normal. Judgment and thought content normal.          Assessment & Plan:

## 2014-03-11 NOTE — Assessment & Plan Note (Signed)
Augmentin x 10 d Sudafed

## 2014-07-21 MED ORDER — ROSUVASTATIN CALCIUM 10 MG PO TABS: 10.0000 mg | ORAL_TABLET | Freq: Every day | ORAL | Status: DC

## 2014-07-21 NOTE — Addendum Note (Signed)
Addended by: Rene PaciLESCHBER, VALERIE A on: 07/21/2014 05:31 PM   Modules accepted: Orders, Medications

## 2014-10-12 ENCOUNTER — Encounter: Payer: Self-pay | Admitting: Family Medicine

## 2014-10-12 ENCOUNTER — Ambulatory Visit (INDEPENDENT_AMBULATORY_CARE_PROVIDER_SITE_OTHER)
Admission: RE | Admit: 2014-10-12 | Discharge: 2014-10-12 | Disposition: A | Discharge status: Home / Self Care | Payer: 59 | Source: Ambulatory Visit | Attending: Family Medicine | Admitting: Family Medicine

## 2014-10-12 ENCOUNTER — Ambulatory Visit (INDEPENDENT_AMBULATORY_CARE_PROVIDER_SITE_OTHER): Payer: 59 | Admitting: Family Medicine

## 2014-10-12 VITALS — BP 130/76 | HR 76 | Ht 67.0 in | Wt 212.0 lb

## 2014-10-12 DIAGNOSIS — M545 Low back pain, unspecified: Secondary | ICD-10-CM

## 2014-10-12 DIAGNOSIS — M542 Cervicalgia: Secondary | ICD-10-CM

## 2014-10-12 MED ORDER — TRAMADOL HCL 50 MG PO TABS: 50.0000 mg | ORAL_TABLET | Freq: Every evening | ORAL | Status: DC | PRN

## 2014-10-12 MED ORDER — CYCLOBENZAPRINE HCL 10 MG PO TABS: 10.0000 mg | ORAL_TABLET | Freq: Three times a day (TID) | ORAL | Status: DC | PRN

## 2014-10-12 MED ORDER — MELOXICAM 15 MG PO TABS: 15.0000 mg | ORAL_TABLET | Freq: Every day | ORAL | Status: DC
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
## 2014-10-12 NOTE — Assessment & Plan Note (Signed)
Patient did have neck pain after the fall this likely more with less. Patient does have full passive range of motion which is a good sign. Negative compression and no radicular symptoms. I do want to get an x-ray to rule out any bony fracture. Otherwise patient given home exercises and same regimen as stated for his lower back. He will return in one week for further evaluation.

## 2014-10-13 ENCOUNTER — Encounter: Payer: Self-pay | Admitting: Internal Medicine

## 2014-10-19 ENCOUNTER — Encounter: Payer: Self-pay | Admitting: Family Medicine

## 2014-10-19 ENCOUNTER — Ambulatory Visit (INDEPENDENT_AMBULATORY_CARE_PROVIDER_SITE_OTHER): Payer: 59 | Admitting: Family Medicine

## 2014-10-19 VITALS — BP 126/74 | HR 72 | Wt 214.0 lb

## 2014-10-19 DIAGNOSIS — M545 Low back pain, unspecified: Secondary | ICD-10-CM

## 2014-10-19 NOTE — Patient Instructions (Signed)
Good to see you Planks would be good for the core.  On wall heels, butt shoulder and head touching for goal of 5 minutes.  New exercises 3 times a week Vitamin D 2000 IU daily Turmeric 500mg  twice daily See me again in 4 weeks.   Scapular Winging  with Rehab  Scapular winging syndrome is also known as serratus anterior palsy or long thoracic nerve injury. The condition is an uncommon injury to the nervous system. The condition is caused by injury to the long thoracic nerve that runs through the neck and shoulder. Injury to the shoulder, such as a fall or repetitive stress on the shoulder causes the nerve to become stretched. Occasionally the injury is the result of an infection of the nerve. Damage to the long thoracic nerve results in weakness of the serratus anterior muscle. The serratus anterior muscle is responsible for controlling the shoulder blade (scapula). Weakness in this muscle results in a instability (winging) of the scapula. SYMPTOMS   Pain and weakness in the shoulder (usually the back of the shoulder) that is often diffuse or unable to localize.  Loss of or decrease in shoulder function.  Upper back pain while sitting, due to the scapula pressing on the back of the chair.  Visible deformity in the back of the shoulder. CAUSES  Scapular winging is caused by stretching of the long thoracic nerve. Common mechanisms of injury include:  Viral illness.  Repetitive and/or stressful use of the shoulder.  Falling onto the shoulder with the head and neck stretched away from the shoulder. RISK INCREASES WITH:  Contact sports (football, rugby, lacrosse, or soccer).  Activities involving overhead arm movement (baseball, volleyball, or racquet sports).  Poor strength and flexibility. PREVENTION  Warm up and stretch properly before activity.  Allow for adequate recovery between workouts.  Maintain physical fitness:  Strength, flexibility, and endurance.  Cardiovascular  fitness.  Learn and use proper technique. When possible, have a coach correct improper technique. PROGNOSIS  Scapular winging normally resolves spontaneously within 18 months. In rare circumstances surgery is recommended.  RELATED COMPLICATIONS   Permanent nerve damage, including pain, numbness, tingle, or weakness.  Shoulder weakness.  Recurrent shoulder pain.  Inability to compete in athletics. TREATMENT Treatment initially involves resting from any activities that aggravate your symptoms. The use of ice and medication may help reduce pain and inflammation. The use of strengthening and stretching exercises may help reduce pain with activity, specifically shoulder exercises that improve range of motion. These exercises may be performed at home or with referral to a therapist. If symptoms persist for greater than 6 months despite non-surgical (conservative) treatment, then surgery may be recommended. Surgery is only used for the most serious cases and the purpose is to regain function, not to allow an athlete to return to sports. MEDICATION   If pain medication is necessary, then nonsteroidal anti-inflammatory medications, such as aspirin and ibuprofen, or other minor pain relievers, such as acetaminophen, are often recommended.  Do not take pain medication for 7 days before surgery.  Prescription pain relievers may be given if deemed necessary by your caregiver. Use only as directed and only as much as you need. HEAT AND COLD  Cold treatment (icing) relieves pain and reduces inflammation. Cold treatment should be applied for 10 to 15 minutes every 2 to 3 hours for inflammation and pain and immediately after any activity that aggravates your symptoms. Use ice packs or massage the area with a piece of ice (ice massage).  Heat treatment may be used prior to performing the stretching and strengthening activities prescribed by your caregiver, physical therapist, or athletic trainer. Use a heat  pack or soak the injury in warm water. SEEK MEDICAL CARE IF:  Treatment seems to offer no benefit, or the condition worsens.  Any medications produce adverse side effects. EXERCISES  RANGE OF MOTION (ROM) AND STRETCHING EXERCISES - Scapular Winging (Serratus Anterior Palsy, Long Thoracic Nerve Injury)  These exercises may help you when beginning to rehabilitate your injury. Your symptoms may resolve with or without further involvement from your physician, physical therapist or athletic trainer. While completing these exercises, remember:   Restoring tissue flexibility helps normal motion to return to the joints. This allows healthier, less painful movement and activity.  An effective stretch should be held for at least 30 seconds.  A stretch should never be painful. You should only feel a gentle lengthening or release in the stretched tissue. ROM - Pendulum  Bend at the waist so that your right / left arm falls away from your body. Support yourself with your opposite hand on a solid surface, such as a table or a countertop.  Your right / left arm should be perpendicular to the ground. If it is not perpendicular, you need to lean over farther. Relax the muscles in your right / left arm and shoulder as much as possible.  Gently sway your hips and trunk so they move your right / left arm without any use of your right / left shoulder muscles.  Progress your movements so that your right / left arm moves side to side, then forward and backward, and finally, both clockwise and counterclockwise.  Complete __________ repetitions in each direction. Many people use this exercise to relieve discomfort in their shoulder as well as to gain range of motion. Repeat __________ times. Complete this exercise __________ times per day. STRETCH - Flexion, Seated   Sit in a firm chair so that your right / left forearm can rest on a table or on a table or countertop. Your right / left elbow should rest below  the height of your shoulder so that your shoulder feels supported and not tense or uncomfortable.  Keeping your right / left shoulder relaxed, lean forward at your waist, allowing your right / left hand to slide forward. Bend forward until you feel a moderate stretch in your shoulder, but before you feel an increase in your pain.  Hold __________ seconds. Slowly return to your starting position. Repeat __________ times. Complete this exercise __________ times per day.  STRETCH - Flexion, Standing  Stand with good posture. With an underhand grip on your right / left and an overhand grip on the opposite hand, grasp a broomstick or cane so that your hands are a little more than shoulder-width apart.  Keeping your right / left elbow straight and shoulder muscles relaxed, push the stick with your opposite hand to raise your right / left arm in front of your body and then overhead. Raise your arm until you feel a stretch in your right / left shoulder, but before you have increased shoulder pain.  Avoid shrugging your right / left shoulder as your arm rises by keeping your shoulder blade tucked down and toward your mid-back spine. Hold __________ seconds.  Slowly return to the starting position. Repeat __________ times. Complete this exercise __________ times per day. STRETCH - Abduction, Supine  Stand with good posture. With an underhand grip on your right / left and  an overhand grip on the opposite hand, grasp a broomstick or cane so that your hands are a little more than shoulder-width apart.  Keeping your right / left elbow straight and shoulder muscles relaxed, push the stick with your opposite hand to raise your right / left arm out to the side of your body and then overhead. Raise your arm until you feel a stretch in your right / left shoulder, but before you have increased shoulder pain.  Avoid shrugging your right / left shoulder as your arm rises by keeping your shoulder blade tucked down  and toward your mid-back spine. Hold __________ seconds.  Slowly return to the starting position. Repeat __________ times. Complete this exercise __________ times per day. ROM - Flexion, Active-Assisted  Lie on your back. You may bend your knees for comfort.  Grasp a broomstick or cane so your hands are about shoulder-width apart. Your right / left hand should grip the end of the stick/cane so that your hand is positioned "thumbs-up," as if you were about to shake hands.  Using your healthy arm to lead, raise your right / left arm overhead until you feel a gentle stretch in your shoulder. Hold __________ seconds.  Use the stick/cane to assist in returning your right / left arm to its starting position. Repeat __________ times. Complete this exercise __________ times per day.  STRENGTHENING EXERCISES - Scapular Winging (Serratus Anterior Palsy, Long Thoracic Nerve Injury) These exercises may help you when beginning to rehabilitate your injury. They may resolve your symptoms with or without further involvement from your physician, physical therapist or athletic trainer. While completing these exercises, remember:   Muscles can gain both the endurance and the strength needed for everyday activities through controlled exercises.  Complete these exercises as instructed by your physician, physical therapist or athletic trainer. Progress with the resistance and repetition exercises only as your caregiver advises.  You may experience muscle soreness or fatigue, but the pain or discomfort you are trying to eliminate should never worsen during these exercises. If this pain does worsen, stop and make certain you are following the directions exactly. If the pain is still present after adjustments, discontinue the exercise until you can discuss the trouble with your clinician.  During your recovery, avoid activity or exercises which involve actions that place your injured hand or elbow above your head or  behind your back or head. These positions stress the tissues which are trying to heal. STRENGTH - Scapular Depression and Adduction   With good posture, sit on a firm chair. Supported your arms in front of you with pillows, arm rests or a table top. Have your elbows in line with the sides of your body.  Gently draw your shoulder blades down and toward your mid-back spine. Gradually increase the tension without tensing the muscles along the top of your shoulders and the back of your neck.  Hold for __________ seconds. Slowly release the tension and relax your muscles completely before completing the next repetition.  After you have practiced this exercise, remove the arm support and complete it in standing as well as sitting. Repeat __________ times. Complete this exercise __________ times per day.  STRENGTH - Scapular Protractors, Standing   Stand arms-length away from a wall. Place your hands on the wall, keeping your elbows straight.  Begin by dropping your shoulder blades down and toward your mid-back spine.  To strengthen your protractors, keep your shoulder blades down, but slide them forward on your rib cage.  It will feel as if you are lifting the back of your rib cage away from the wall. This is a subtle motion and can be challenging to complete. Ask your clinician for further instruction if you are not sure you are doing the exercise correctly.  Hold for __________ seconds. Slowly return to the starting position, resting the muscles completely before completing the next repetition. Repeat __________ times. Complete this exercise __________ times per day. STRENGTH - Scapular Protractors, Supine  Lie on your back on a firm surface. Extend your right / left arm straight into the air while holding a __________ weight in your hand.  Keeping your head and back in place, lift your shoulder off the floor.  Hold __________ seconds. Slowly return to the starting position and allow your  muscles to relax completely before completing the next repetition. Repeat __________ times. Complete this exercise __________ times per day. STRENGTH - Scapular Protractors, Quadruped  Get onto your hands and knees with your shoulders directly over your hands (or as close as you comfortably can be).  Keeping your elbows locked, lift the back of your rib cage up into your shoulder blades so your mid-back rounds-out. Keep your neck muscles relaxed.  Hold this position for __________ seconds. Slowly return to the starting position and allow your muscles to relax completely before completing the next repetition. Repeat __________ times. Complete this exercise __________ times per day.  STRENGTH - Scapular Depressors  Keeping your feet on the floor, lift your bottom from the seat and lock your elbows.  Keeping your elbows straight, allow gravity to pull your body weight down. Your shoulders will rise toward your ears.  Raise your body against gravity by drawing your shoulder blades down your back, shortening the distance between your shoulders and ears. Although your feet should always maintain contact with the floor, your feet should progressively support less body weight as you get stronger.  Hold __________ seconds. In a controlled and slow manner, lower your body weight to begin the next repetition. Repeat __________ times. Complete this exercise __________ times per day.  STRENGTH - Shoulder Extensors, Prone  Lie on your stomach on a firm surface so that your right / left arm overhangs the edge. Rest your forehead on your opposite forearm. With your thumb facing away from your body and your elbow straight, hold a __________ weight in your hand.  Squeeze your right / left shoulder blade to your mid-back spine and then slowly raise your arm behind you to the height of the bed.  Hold for __________ seconds. Slowly reverse the directions and return to the starting position, controlling the  weight as you lower your arm. Repeat __________ times. Complete this exercise __________ times per day.  STRENGTH - Horizontal Abductors Choose one of the two oppositions to complete this exercise. Prone: lying on stomach:  Lie on your stomach on a firm surface so that your right / left arm overhangs the edge. Rest your forehead on your opposite forearm. With your palm facing the floor and your elbow straight, hold a __________ weight in your hand.  Squeeze your right / left shoulder blade to your mid-back spine and then slowly raise your arm to the height of the bed.  Hold for __________ seconds. Slowly reverse the directions and return to the starting position, controlling the weight as you lower your arm. Repeat __________ times. Complete this exercise __________ times per day. Standing:  Secure a rubber exercise band/tubing so that it is at  the height of your shoulders when you are either standing or sitting on a firm arm-less chair.  Grasp an end of the band/tubing in each hand and have your palms face each other. Straighten your elbows and lift your hands straight in front of you at shoulder height. Step back away from the secured end of band/tubing until it becomes tense.  Squeeze your shoulder blades together. Keeping your elbows locked and your hands at shoulder-height, bring your hands out to your side.  Hold __________ seconds. Slowly ease the tension on the band/tubing as you reverse the directions and return to the starting position. Repeat __________ times. Complete this exercise __________ times per day. STRENGTH - Scapular Retractors  Secure a rubber exercise band/tubing so that it is at the height of your shoulders when you are either standing or sitting on a firm arm-less chair.  With a palm-down grip, grasp an end of the band/tubing in each hand. Straighten your elbows and lift your hands straight in front of you at shoulder height. Step back away from the secured end of  band/tubing until it becomes tense.  Squeezing your shoulder blades together, draw your elbows back as you bend them. Keep your upper arm lifted away from your body throughout the exercise.  Hold __________ seconds. Slowly ease the tension on the band/tubing as you reverse the directions and return to the starting position. Repeat __________ times. Complete this exercise __________ times per day. STRENGTH - Shoulder Extensors   Secure a rubber exercise band/tubing so that it is at the height of your shoulders when you are either standing or sitting on a firm arm-less chair.  With a thumbs-up grip, grasp an end of the band/tubing in each hand. Straighten your elbows and lift your hands straight in front of you at shoulder height. Step back away from the secured end of band/tubing until it becomes tense.  Squeezing your shoulder blades together, pull your hands down to the sides of your thighs. Do not allow your hands to go behind you.  Hold for __________ seconds. Slowly ease the tension on the band/tubing as you reverse the directions and return to the starting position. Repeat __________ times. Complete this exercise __________ times per day.  STRENGTH - Scapular Retractors and External Rotators  Secure a rubber exercise band/tubing so that it is at the height of your shoulders when you are either standing or sitting on a firm arm-less chair.  With a palm-down grip, grasp an end of the band/tubing in each hand. Bend your elbows 90 degrees and lift your elbows to shoulder height at your sides. Step back away from the secured end of band/tubing until it becomes tense.  Squeezing your shoulder blades together, rotate your shoulder so that your upper arm and elbow remain stationary, but your fists travel upward to head-height.  Hold __________ for seconds. Slowly ease the tension on the band/tubing as you reverse the directions and return to the starting position. Repeat __________ times.  Complete this exercise __________ times per day.  STRENGTH - Scapular Retractors and External Rotators, Rowing  Secure a rubber exercise band/tubing so that it is at the height of your shoulders when you are either standing or sitting on a firm arm-less chair.  With a palm-down grip, grasp an end of the band/tubing in each hand. Straighten your elbows and lift your hands straight in front of you at shoulder height. Step back away from the secured end of band/tubing until it becomes tense.  Step  1: Squeeze your shoulder blades together. Bending your elbows, draw your hands to your chest as if you are rowing a boat. At the end of this motion, your hands and elbow should be at shoulder-height and your elbows should be out to your sides.  Step 2: Rotate your shoulder to raise your hands above your head. Your forearms should be vertical and your upper-arms should be horizontal.  Hold for __________ seconds. Slowly ease the tension on the band/tubing as you reverse the directions and return to the starting position. Repeat __________ times. Complete this exercise __________ times per day.  STRENGTH - Scapular Retractors and Elevators  Secure a rubber exercise band/tubing so that it is at the height of your shoulders when you are either standing or sitting on a firm arm-less chair.  With a thumbs-up grip, grasp an end of the band/tubing in each hand. Step back away from the secured end of band/tubing until it becomes tense.  Squeezing your shoulder blades together, straighten your elbows and lift your hands straight over your head.  Hold for __________ seconds. Slowly ease the tension on the band/tubing as you reverse the directions and return to the starting position. Repeat __________ times. Complete this exercise __________ times per day.  Document Released: 12/03/2005 Document Revised: 02/25/2012 Document Reviewed: 03/17/2009 [facility] Patient Information 2015 [place], [place]. This information is  not intended to replace advice given to you by your health care provider. Make sure you discuss any questions you have with your health care provider.
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
## 2014-10-19 NOTE — Assessment & Plan Note (Signed)
Patient did have an acute on chronic flare of his low back pain. Patient is improving but encouraged him to do more of the exercises on a regular basis. We discussed over-the-counter medications that could be beneficial as well. Patient is not taking any prescribed medications at this time. Discussed an icing regimen that will be helpful. We discussed postural training the can be helpful as well. Patient may be a candidate for osteopathic manipulation at a later date. Patient was given another handout to work on the thoracic back musculature that is also contributing. Patient will try these interventions and come back and see me again in 4 weeks for further evaluation.  Spent greater than 25 minutes with patient face-to-face and had greater than 50% of counseling including as described above in assessment and plan.

## 2014-11-18 ENCOUNTER — Ambulatory Visit: Payer: 59 | Admitting: Family Medicine

## 2015-01-14 ENCOUNTER — Other Ambulatory Visit: Payer: Self-pay | Admitting: Internal Medicine

## 2015-02-21 ENCOUNTER — Encounter (HOSPITAL_COMMUNITY): Payer: Self-pay | Admitting: *Deleted

## 2015-02-21 ENCOUNTER — Emergency Department (HOSPITAL_COMMUNITY)
Admission: EM | Admit: 2015-02-21 | Discharge: 2015-02-21 | Disposition: A | Discharge status: Nursing Home (Includes ICF) | Payer: 59 | Source: Home / Self Care | Attending: Emergency Medicine | Admitting: Emergency Medicine

## 2015-02-21 ENCOUNTER — Encounter (HOSPITAL_COMMUNITY): Payer: Self-pay | Admitting: Emergency Medicine

## 2015-02-21 ENCOUNTER — Other Ambulatory Visit: Payer: Self-pay

## 2015-02-21 ENCOUNTER — Emergency Department (HOSPITAL_COMMUNITY)
Admission: EM | Admit: 2015-02-21 | Discharge: 2015-02-22 | Disposition: A | Discharge status: Home / Self Care | Payer: 59 | Attending: Emergency Medicine | Admitting: Emergency Medicine

## 2015-02-21 ENCOUNTER — Emergency Department (HOSPITAL_COMMUNITY): Payer: 59

## 2015-02-21 DIAGNOSIS — F329 Major depressive disorder, single episode, unspecified: Secondary | ICD-10-CM | POA: Insufficient documentation

## 2015-02-21 DIAGNOSIS — Z872 Personal history of diseases of the skin and subcutaneous tissue: Secondary | ICD-10-CM | POA: Diagnosis not present

## 2015-02-21 DIAGNOSIS — B349 Viral infection, unspecified: Secondary | ICD-10-CM

## 2015-02-21 DIAGNOSIS — E860 Dehydration: Secondary | ICD-10-CM

## 2015-02-21 DIAGNOSIS — E785 Hyperlipidemia, unspecified: Secondary | ICD-10-CM | POA: Insufficient documentation

## 2015-02-21 DIAGNOSIS — Z8709 Personal history of other diseases of the respiratory system: Secondary | ICD-10-CM | POA: Insufficient documentation

## 2015-02-21 DIAGNOSIS — K219 Gastro-esophageal reflux disease without esophagitis: Secondary | ICD-10-CM | POA: Insufficient documentation

## 2015-02-21 DIAGNOSIS — R509 Fever, unspecified: Secondary | ICD-10-CM | POA: Diagnosis present

## 2015-02-21 DIAGNOSIS — H9190 Unspecified hearing loss, unspecified ear: Secondary | ICD-10-CM | POA: Diagnosis not present

## 2015-02-21 DIAGNOSIS — R69 Illness, unspecified: Principal | ICD-10-CM

## 2015-02-21 DIAGNOSIS — J111 Influenza due to unidentified influenza virus with other respiratory manifestations: Secondary | ICD-10-CM

## 2015-02-21 DIAGNOSIS — Z79899 Other long term (current) drug therapy: Secondary | ICD-10-CM | POA: Insufficient documentation

## 2015-02-21 DIAGNOSIS — J189 Pneumonia, unspecified organism: Secondary | ICD-10-CM

## 2015-02-21 LAB — CBC WITH DIFFERENTIAL/PLATELET
Basophils Absolute: 0 10*3/uL (ref 0.0–0.1)
Basophils Relative: 0 % (ref 0–1)
Eosinophils Absolute: 0 10*3/uL (ref 0.0–0.7)
Eosinophils Relative: 0 % (ref 0–5)
HEMATOCRIT: 37 % — AB (ref 39.0–52.0)
HEMOGLOBIN: 12.4 g/dL — AB (ref 13.0–17.0)
LYMPHS PCT: 13 % (ref 12–46)
Lymphs Abs: 0.9 10*3/uL (ref 0.7–4.0)
MCH: 29 pg (ref 26.0–34.0)
MCHC: 33.5 g/dL (ref 30.0–36.0)
MCV: 86.4 fL (ref 78.0–100.0)
MONO ABS: 0.6 10*3/uL (ref 0.1–1.0)
MONOS PCT: 8 % (ref 3–12)
NEUTROS PCT: 79 % — AB (ref 43–77)
Neutro Abs: 5.7 10*3/uL (ref 1.7–7.7)
Platelets: 231 10*3/uL (ref 150–400)
RBC: 4.28 MIL/uL (ref 4.22–5.81)
RDW: 13.1 % (ref 11.5–15.5)
WBC: 7.2 10*3/uL (ref 4.0–10.5)

## 2015-02-21 LAB — RAPID STREP SCREEN (MED CTR MEBANE ONLY): STREPTOCOCCUS, GROUP A SCREEN (DIRECT): NEGATIVE

## 2015-02-21 LAB — BASIC METABOLIC PANEL
Anion gap: 10 (ref 5–15)
BUN: 9 mg/dL (ref 6–23)
CALCIUM: 8.8 mg/dL (ref 8.4–10.5)
CO2: 19 mmol/L (ref 19–32)
CREATININE: 0.98 mg/dL (ref 0.50–1.35)
Chloride: 109 mmol/L (ref 96–112)
GFR calc Af Amer: >90 mL/min (ref >90)
GFR calc non Af Amer: >90 mL/min (ref >90)
GLUCOSE: 135 mg/dL — AB (ref 70–99)
Potassium: 3.8 mmol/L (ref 3.5–5.1)
Sodium: 138 mmol/L (ref 135–145)

## 2015-02-21 MED ORDER — SODIUM CHLORIDE 0.9 % IV SOLN: INTRAVENOUS | Status: DC

## 2015-02-21 MED ORDER — SODIUM CHLORIDE 0.9 % IV BOLUS (SEPSIS)
1000.0000 mL | Freq: Once | INTRAVENOUS | Status: AC
  Administered 2015-02-21: 1000 mL via INTRAVENOUS

## 2015-02-21 MED ORDER — KETOROLAC TROMETHAMINE 30 MG/ML IJ SOLN
30.0000 mg | Freq: Once | INTRAMUSCULAR | Status: AC
  Administered 2015-02-21: 30 mg via INTRAVENOUS
  Filled 2015-02-21: qty 1

## 2015-02-21 MED ORDER — OSELTAMIVIR PHOSPHATE 75 MG PO CAPS: 75.0000 mg | ORAL_CAPSULE | Freq: Two times a day (BID) | ORAL | Status: DC

## 2015-02-21 NOTE — ED Provider Notes (Signed)
   Chief Complaint   Influenza   History of Present Illness   Harvin Hazelimothy B Puccini is a 34 year old male who has had a four-day history of fever to 103.3, myalgias, headache, dizziness, malaise, weakness, and fatigue. He also has had a cough productive of brown sputum, shortness of breath, nasal congestion, rhinorrhea, and sore throat. He felt nauseated and vomited 12 times yesterday.  Review of Systems   Other than as noted above, the patient denies any of the following symptoms: Systemic:  No fevers, chills, sweats, or myalgias. Eye:  No redness or discharge. ENT:  No ear pain, headache, nasal congestion, drainage, sinus pressure, or sore throat. Neck:  No neck pain, stiffness, or swollen glands. Lungs:  No cough, sputum production, hemoptysis, wheezing, chest tightness, shortness of breath or chest pain. GI:  No abdominal pain, nausea, vomiting or diarrhea.  PMFSH   Past medical history, family history, social history, meds, and allergies were reviewed. He currently takes Prozac, TriCor, Zocor, and has taken 1 dose of Tamiflu.  Physical exam   Vital signs:  BP 117/65 mmHg  Pulse 102  Temp(Src) 101.9 F (38.8 C) (Oral)  Resp 20  SpO2 98% General:  He appears lethargic and is lying on the table, looks very uncomfortable.  Skin warm and dry. Eye:  No conjunctival injection or drainage. Lids were normal. ENT:  TMs and canals were normal, without erythema or inflammation.  Nasal mucosa was clear and uncongested, without drainage.  Mucous membranes were moist.  Pharynx was clear with no exudate or drainage.  There were no oral ulcerations or lesions. Neck:  Supple, no adenopathy, tenderness or mass. Lungs:  No respiratory distress.  He has rales at both lung bases, no wheezes or rhonchi.  Heart:  Regular rhythm, without gallops, murmers or rubs. Skin:  Clear, warm, and dry, without rash or lesions.   Course in Urgent Care Center   The following medications were  given:  Medications  0.9 %  sodium chloride infusion   Assessment     The primary encounter diagnosis was Influenza-like illness. Diagnoses of Community acquired pneumonia and Dehydration were also pertinent to this visit.  Plan    The patient was transferred to the ED via [facility] in stable condition.  Medical Decision Making:  The patient is a 34 year old male who has had a four-day history of fever to 103.3, generalized myalgias, chills, headache, dizziness, malaise, and fatigue. He's had a cough productive of brown sputum and shortness of breath. He also notes nasal congestion, rhinorrhea, sore throat, and vomited about 12 times yesterday. He has not gotten the flu vaccine this year.On examination he has rales at both lung bases consistent with bilateral pneumonia. He appears uncomfortable and lethargic. His temp here is 101.9, pulse 102, blood pressure 117/65. I'm concerned about bilateral pneumonia and dehydration. I think he needs observation and IV fluids.       Reuben Likesavid C Flower Franko, MD 02/21/15 [phone]

## 2015-02-21 NOTE — ED Notes (Signed)
Pt. Has ben feeling sick for a few days. Tried some OTC treatments like nyquil and symptoms arent going away. Pt. Did have 1 episode of vomiting yesterday with no diarrhea. Pt. Went to urgent care and had a fever of 101.9.

## 2015-02-21 NOTE — Discharge Instructions (Signed)
We have determined that your problem requires further evaluation in the emergency department.  We will take care of your transport there.  Once at the emergency department, you will be evaluated by a provider and they will order whatever treatment or tests they deem necessary.  We cannot guarantee that they will do any specific test or do any specific treatment.  ° °

## 2015-02-21 NOTE — ED Notes (Signed)
Patient c/o flu-like sx including high fever of 103 F, bodyaches, chills, dizziness and sore throat  X 3 days ago. Patient reports he has had one dose of Tamiflu with no relief. Patient is lying flat on exam table and states he feels really dizzy.

## 2015-02-22 NOTE — ED Notes (Signed)
Pt. Left with all belongings 

## 2015-02-22 NOTE — Progress Notes (Signed)
erx for Tamiflu done 02/21/15 at request of pt's [facility] Pt later seen in UC and ED for symptoms - notes reviewed thanks

## 2015-02-22 NOTE — Discharge Instructions (Signed)
Continue to take alternating doses of Tylenol, ibuprofen, drink plenty of fluids, get plenty of rest and your lab work, x-ray overall normal, as well as a strep test is negative

## 2015-02-24 ENCOUNTER — Encounter: Payer: Self-pay | Admitting: Family

## 2015-02-24 ENCOUNTER — Ambulatory Visit (INDEPENDENT_AMBULATORY_CARE_PROVIDER_SITE_OTHER): Payer: 59 | Admitting: Family

## 2015-02-24 VITALS — BP 132/82 | HR 59 | Temp 97.6°F | Resp 18 | Ht 67.0 in | Wt 206.0 lb

## 2015-02-24 DIAGNOSIS — R05 Cough: Secondary | ICD-10-CM

## 2015-02-24 DIAGNOSIS — R059 Cough, unspecified: Secondary | ICD-10-CM

## 2015-02-24 LAB — CULTURE, GROUP A STREP: STREP A CULTURE: NEGATIVE

## 2015-02-24 MED ORDER — LEVOFLOXACIN 500 MG PO TABS: 500.0000 mg | ORAL_TABLET | Freq: Every day | ORAL | Status: DC

## 2015-02-24 MED ORDER — PROMETHAZINE-CODEINE 6.25-10 MG/5ML PO SYRP: 5.0000 mL | ORAL_SOLUTION | Freq: Four times a day (QID) | ORAL | Status: DC | PRN

## 2015-02-24 NOTE — Progress Notes (Signed)
Subjective:    Patient ID: Jason Phillips, male    DOB: 04/20/1981, 34 y.o.   MRN: [id number]  Chief Complaint  Patient presents with  . Flu symptoms    fever, body aches, headache, productive cough, every night for last 5 nights he has been having a fever of 103 before going bed, last night he had severe headache, sweats and fever, dizziness    HPI:  Jason Hazelimothy B Arrighi is a 34 y.o. male who presents today for an acute visit.   This is a continuation of a problem that the patient was seen in both UC and ED. Associated symptoms fevers, body aches and productive cough which has been going on for about 5 days now. Indicates he had a severe headache, sweats and dizziness. Was previously prescribed Tamiflu which has not provided any relief. Has been on tylenol and ibuprofen, zyrtec, promethazine-codeine syrup from a previous illness which do not help very much.    Allergies  Allergen Reactions  . Morphine And Related Nausea And Vomiting    Severe projectile vomiting    Current Outpatient Prescriptions on File Prior to Visit  Medication Sig Dispense Refill  . cyclobenzaprine (FLEXERIL) 10 MG tablet Take 1 tablet (10 mg total) by mouth 3 (three) times daily as needed for muscle spasms. 30 tablet 0  . diphenhydrAMINE (BENADRYL) 25 mg capsule Take 25 mg by mouth at bedtime.     . famotidine (PEPCID AC) 10 MG chewable tablet Chew 20 mg by mouth at bedtime.     . fenofibrate 160 MG tablet TAKE 1 TABLET BY MOUTH DAILY 90 tablet PRN  . FLUoxetine (PROZAC) 20 MG capsule Take 2 capsules (40 mg total) by mouth daily. 180 capsule 1  . ibuprofen (ADVIL,MOTRIN) 800 MG tablet Take 800 mg by mouth every 8 (eight) hours as needed for moderate pain.    . meloxicam (MOBIC) 15 MG tablet Take 1 tablet (15 mg total) by mouth daily. 30 tablet 0  . oseltamivir (TAMIFLU) 75 MG capsule Take 1 capsule (75 mg total) by mouth 2 (two) times daily. 10 capsule 0  . rosuvastatin (CRESTOR) 10 MG tablet Take 1  tablet (10 mg total) by mouth daily. 90 tablet 3  . traMADol (ULTRAM) 50 MG tablet Take 1 tablet (50 mg total) by mouth at bedtime as needed. 30 tablet 0   No current facility-administered medications on file prior to visit.     Review of Systems  Constitutional: Positive for fever and chills.  HENT: Positive for sore throat. Negative for congestion.   Respiratory: Positive for cough. Negative for chest tightness, shortness of breath and wheezing.   Neurological: Positive for headaches.      Objective:    BP 132/82 mmHg  Pulse 59  Temp(Src) 97.6 F (36.4 C) (Oral)  Resp 18  Ht 5\' 7"  (1.702 m)  Wt 206 lb (93.441 kg)  BMI 32.26 kg/m2  SpO2 97% Nursing note and vital signs reviewed.  Physical Exam  Constitutional: He is oriented to person, place, and time. He appears well-developed and well-nourished. No distress.  Appears ill and lethargic. Dressed appropriately. Appears stated age.   HENT:  Right Ear: Hearing, tympanic membrane, external ear and ear canal normal.  Left Ear: Hearing, tympanic membrane, external ear and ear canal normal.  Nose: Right sinus exhibits no maxillary sinus tenderness and no frontal sinus tenderness. Left sinus exhibits no maxillary sinus tenderness and no frontal sinus tenderness.  Cardiovascular: Normal rate, regular rhythm, normal heart  sounds and intact distal pulses.   Pulmonary/Chest: Effort normal and breath sounds normal.  Neurological: He is alert and oriented to person, place, and time.  Skin: Skin is warm and dry.  Psychiatric: He has a normal mood and affect. His behavior is normal. Judgment and thought content normal.       Assessment & Plan:

## 2015-02-24 NOTE — Patient Instructions (Signed)
Thank you for choosing [facility].  Summary/Instructions:  Your prescription(s) have been submitted to your pharmacy or been printed and provided for you. Please take as directed and contact our office if you believe you are having problem(s) with the medication(s) or have any questions.  If your symptoms worsen or fail to improve, please contact our office for further instruction, or in case of emergency go directly to the emergency room at the closest medical facility.    Pneumonia Pneumonia is an infection of the lungs.  CAUSES Pneumonia may be caused by bacteria or a virus. Usually, these infections are caused by breathing infectious particles into the lungs (respiratory tract). SIGNS AND SYMPTOMS   Cough.  Fever.  Chest pain.  Increased rate of breathing.  Wheezing.  Mucus production. DIAGNOSIS  If you have the common symptoms of pneumonia, your health care provider will typically confirm the diagnosis with a chest X-ray. The X-ray will show an abnormality in the lung (pulmonary infiltrate) if you have pneumonia. Other tests of your blood, urine, or sputum may be done to find the specific cause of your pneumonia. Your health care provider may also do tests (blood gases or pulse oximetry) to see how well your lungs are working. TREATMENT  Some forms of pneumonia may be spread to other people when you cough or sneeze. You may be asked to wear a mask before and during your exam. Pneumonia that is caused by bacteria is treated with antibiotic medicine. Pneumonia that is caused by the influenza virus may be treated with an antiviral medicine. Most other viral infections must run their course. These infections will not respond to antibiotics.  HOME CARE INSTRUCTIONS   Cough suppressants may be used if you are losing too much rest. However, coughing protects you by clearing your lungs. You should avoid using cough suppressants if you can.  Your health care provider may have  prescribed medicine if he or she thinks your pneumonia is caused by bacteria or influenza. Finish your medicine even if you start to feel better.  Your health care provider may also prescribe an expectorant. This loosens the mucus to be coughed up.  Take medicines only as directed by your health care provider.  Do not smoke. Smoking is a common cause of bronchitis and can contribute to pneumonia. If you are a smoker and continue to smoke, your cough may last several weeks after your pneumonia has cleared.  A cold steam vaporizer or humidifier in your room or home may help loosen mucus.  Coughing is often worse at night. Sleeping in a semi-upright position in a recliner or using a couple pillows under your head will help with this.  Get rest as you feel it is needed. Your body will usually let you know when you need to rest. PREVENTION A pneumococcal shot (vaccine) is available to prevent a common bacterial cause of pneumonia. This is usually suggested for:  People over 36 years old.  Patients on chemotherapy.  People with chronic lung problems, such as bronchitis or emphysema.  People with immune system problems. If you are over 65 or have a high risk condition, you may receive the pneumococcal vaccine if you have not received it before. In some countries, a routine influenza vaccine is also recommended. This vaccine can help prevent some cases of pneumonia.You may be offered the influenza vaccine as part of your care. If you smoke, it is time to quit. You may receive instructions on how to stop smoking. Your  health care provider can provide medicines and counseling to help you quit. SEEK MEDICAL CARE IF: You have a fever. SEEK IMMEDIATE MEDICAL CARE IF:   Your illness becomes worse. This is especially true if you are elderly or weakened from any other disease.  You cannot control your cough with suppressants and are losing sleep.  You begin coughing up blood.  You develop pain  which is getting worse or is uncontrolled with medicines.  Any of the symptoms which initially brought you in for treatment are getting worse rather than better.  You develop shortness of breath or chest pain. MAKE SURE YOU:   Understand these instructions.  Will watch your condition.  Will get help right away if you are not doing well or get worse. Document Released: 12/03/2005 Document Revised: 04/19/2014 Document Reviewed: 02/22/2011 [facility] Patient Information 2015 [place], [place]. This information is not intended to replace advice given to you by your health care provider. Make sure you discuss any questions you have with your health care provider.  Influenza Influenza ("the flu") is a viral infection of the respiratory tract. It occurs more often in winter months because people spend more time in close contact with one another. Influenza can make you feel very sick. Influenza easily spreads from person to person (contagious). CAUSES  Influenza is caused by a virus that infects the respiratory tract. You can catch the virus by breathing in droplets from an infected person's cough or sneeze. You can also catch the virus by touching something that was recently contaminated with the virus and then touching your mouth, nose, or eyes. RISKS AND COMPLICATIONS You may be at risk for a more severe case of influenza if you smoke cigarettes, have diabetes, have chronic heart disease (such as heart failure) or lung disease (such as asthma), or if you have a weakened immune system. Elderly people and pregnant women are also at risk for more serious infections. The most common problem of influenza is a lung infection (pneumonia). Sometimes, this problem can require emergency medical care and may be life threatening. SIGNS AND SYMPTOMS  Symptoms typically last 4 to 10 days and may include:  Fever.  Chills.  Headache, body aches, and muscle aches.  Sore throat.  Chest discomfort and  cough.  Poor appetite.  Weakness or feeling tired.  Dizziness.  Nausea or vomiting. DIAGNOSIS  Diagnosis of influenza is often made based on your history and a physical exam. A nose or throat swab test can be done to confirm the diagnosis. TREATMENT  In mild cases, influenza goes away on its own. Treatment is directed at relieving symptoms. For more severe cases, your health care provider may prescribe antiviral medicines to shorten the sickness. Antibiotic medicines are not effective because the infection is caused by a virus, not by bacteria. HOME CARE INSTRUCTIONS  Take medicines only as directed by your health care provider.  Use a cool mist humidifier to make breathing easier.  Get plenty of rest until your temperature returns to normal. This usually takes 3 to 4 days.  Drink enough fluid to keep your urine clear or pale yellow.  Cover yourmouth and nosewhen coughing or sneezing,and wash your handswellto prevent thevirusfrom spreading.  Stay homefromwork orschool untilthe fever is gonefor at least 59full day. PREVENTION  An annual influenza vaccination (flu shot) is the best way to avoid getting influenza. An annual flu shot is now routinely recommended for all adults in the [place] SEEK MEDICAL CARE IF:  You experiencechest pain, yourcough  worsens,or you producemore mucus.  Youhave nausea,vomiting, ordiarrhea.  Your fever returns or gets worse. SEEK IMMEDIATE MEDICAL CARE IF:  You havetrouble breathing, you become short of breath,or your skin ornails becomebluish.  You have severe painor stiffnessin the neck.  You develop a sudden headache, or pain in the face or ear.  You have nausea or vomiting that you cannot control. MAKE SURE YOU:   Understand these instructions.  Will watch your condition.  Will get help right away if you are not doing well or get worse. Document Released: 11/30/2000 Document Revised: 04/19/2014 Document Reviewed:  03/03/2012 [facility] Patient Information 2015 [place], [place]. This information is not intended to replace advice given to you by your health care provider. Make sure you discuss any questions you have with your health care provider.

## 2015-02-24 NOTE — Progress Notes (Signed)
Pre visit review using our clinic review tool, if applicable. No additional management support is needed unless otherwise documented below in the visit note. 

## 2015-02-24 NOTE — Assessment & Plan Note (Signed)
Symptoms and exam consistent with potential pneumonia, however cannot rule out influenza. Given that patient has been on Tamiflu and has continued fevers with no improvements over the past 5 days, start Levaquin. Continue over-the-counter medications as needed for symptom relief. Start promethazine-codeine as needed for cough and sleep. Continue drinking plenty of fluids and nutrition as tolerated. Follow-up if symptoms worsen or fail to improve.
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
## 2015-05-04 ENCOUNTER — Encounter: Payer: Self-pay | Admitting: Internal Medicine

## 2015-05-04 ENCOUNTER — Ambulatory Visit (INDEPENDENT_AMBULATORY_CARE_PROVIDER_SITE_OTHER): Payer: 59 | Admitting: Internal Medicine

## 2015-05-04 VITALS — BP 132/82 | HR 81 | Temp 98.1°F | Ht 67.0 in | Wt 214.8 lb

## 2015-05-04 DIAGNOSIS — F329 Major depressive disorder, single episode, unspecified: Secondary | ICD-10-CM | POA: Diagnosis not present

## 2015-05-04 DIAGNOSIS — J019 Acute sinusitis, unspecified: Secondary | ICD-10-CM | POA: Diagnosis not present

## 2015-05-04 DIAGNOSIS — E785 Hyperlipidemia, unspecified: Secondary | ICD-10-CM | POA: Diagnosis not present

## 2015-05-04 DIAGNOSIS — F32A Depression, unspecified: Secondary | ICD-10-CM

## 2015-05-04 DIAGNOSIS — B9689 Other specified bacterial agents as the cause of diseases classified elsewhere: Secondary | ICD-10-CM

## 2015-05-04 MED ORDER — PROMETHAZINE-PHENYLEPHRINE 6.25-5 MG/5ML PO SYRP: 5.0000 mL | ORAL_SOLUTION | Freq: Four times a day (QID) | ORAL | Status: DC | PRN

## 2015-05-04 MED ORDER — AZITHROMYCIN 250 MG PO TABS: ORAL_TABLET | ORAL | Status: DC

## 2015-05-04 NOTE — Assessment & Plan Note (Deleted)
BP Readings from Last 3 Encounters:  05/04/15 132/82  02/24/15 132/82  02/22/15 120/72   On ACE with hydrochlorothiazide The current medical regimen is effective;  continue present plan and medications. The patient is asked to make an attempt to improve diet and exercise patterns to aid in medical management of this problem.

## 2015-05-04 NOTE — Assessment & Plan Note (Signed)
Chronic symptoms, dysthymia baseline Controlled with current paroxetine Refill as needed

## 2015-05-04 NOTE — Progress Notes (Signed)
Subjective:    Patient ID: Jason Phillips, male    DOB: 03/28/1981, 34 y.o.   MRN: [id number]  HPI  Patient here for sinus symptoms 6 days Also reviewed chronic medical issues, enteral events  Past Medical History  Diagnosis Date  . ALLERGIC RHINITIS   . DEPRESSION   . DERMATITIS, ATOPIC   . GERD   . Headache(784.0)   . 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 HYPERLIPIDEMIA   . MOOD DISORDER   . Lactose intolerance     Review of Systems  Constitutional: Positive for fever (LGF x 5 d), chills and fatigue.  HENT: Positive for postnasal drip and sinus pressure. Negative for ear discharge, ear pain, facial swelling, rhinorrhea, sneezing, sore throat and trouble swallowing. Hearing loss: chronic on R, unchanged.   Respiratory: Positive for cough (brown phlegm, worse in a.m., then green and thick intermittently during the day). Negative for shortness of breath.   Cardiovascular: Negative for chest pain and leg swelling.  Neurological: Positive for headaches (over frontal sinuses).       Objective:    Physical Exam  Constitutional: He appears well-developed and well-nourished.  fatigued  HENT:  Head: Normocephalic and atraumatic.  Mouth/Throat: Oropharynx is clear and moist. No oropharyngeal exudate.  Tender over frontal sinus Nares with swollen turbinates and thick drainage  Eyes: Conjunctivae and EOM are normal. Pupils are equal, round, and reactive to light.  Neck: Normal range of motion. Neck supple.  Cardiovascular: Normal rate, regular rhythm and normal heart sounds.  Exam reveals no friction rub.   No murmur heard. Pulmonary/Chest: Effort normal and breath sounds normal. No respiratory distress. He has no wheezes.  Lymphadenopathy:    He has no cervical adenopathy.    BP 132/82 mmHg  Pulse 81  Temp(Src) 98.1 F (36.7 C) (Oral)  Ht 5\' 7"  (1.702 m)  Wt 214 lb 12 oz (97.41 kg)  BMI 33.63 kg/m2  SpO2 98% Wt Readings from Last 3 Encounters:    05/04/15 214 lb 12 oz (97.41 kg)  02/24/15 206 lb (93.441 kg)  02/21/15 210 lb (95.255 kg)     Lab Results  Component Value Date   WBC 7.2 02/21/2015   HGB 12.4* 02/21/2015   HCT 37.0* 02/21/2015   PLT 231 02/21/2015   GLUCOSE 135* 02/21/2015   CHOL 245* 02/04/2014   TRIG 263.0* 02/04/2014   HDL 42.10 02/04/2014   LDLDIRECT 159.6 02/04/2014   LDLCALC 115* 01/15/2013   ALT 33 02/04/2014   AST 31 02/04/2014   NA 138 02/21/2015   K 3.8 02/21/2015   CL 109 02/21/2015   CREATININE 0.98 02/21/2015   BUN 9 02/21/2015   CO2 19 02/21/2015   TSH 2.51 02/04/2014   INR 1.0 03/28/2008   HGBA1C 5.8 01/11/2014    Dg Chest 2 View  02/21/2015   CLINICAL DATA:  Fever, cough, shortness of breath, and body aches for 4 days.  EXAM: CHEST  2 VIEW  COMPARISON:  None.  FINDINGS: The heart size and mediastinal contours are within normal limits. Both lungs are clear. The visualized skeletal structures are unremarkable.  IMPRESSION: No active cardiopulmonary disease.   Electronically Signed   By: Burman NievesWilliam  Stevens M.D.   On: 02/21/2015 22:20       Assessment & Plan:   Acute bacterial frontal sinusitis. Prescribed antihistamine and decongestant syrup with Z-Pak. Symptomatic education with alternating Tylenol and ibuprofen. Patient to call if symptoms worse or unimproved in next  7-14 days  Problem List Items Addressed This Visit    Chronic depression    Chronic symptoms, dysthymia baseline Controlled with current paroxetine Refill as needed      Hyperlipidemia    On statin and fenofibrate - dose increased 10/2011. Temporary hold statin with acute viral illness and inc LFTs mid 12/2013, then resumed normalization Check annually, patient will return this week for labs  The patient is asked to make an attempt to improve diet and exercise patterns to aid in medical management of this problem.       Relevant Orders   Lipid panel    Other Visit Diagnoses    Acute bacterial sinusitis    -   Primary    Relevant Medications    promethazine-phenylephrine (PROMETHAZINE-PHENYLEPHRINE) 6.25-5 MG/5ML SYRP    azithromycin (ZITHROMAX) 250 MG tablet        Rene PaciValerie Narada Uzzle, MD

## 2015-05-04 NOTE — Patient Instructions (Addendum)
It was good to see you today.  Zpak antibiotics and prescription decongestant/cough syrup - Your prescription(s) have been submitted to your pharmacy. Please take as directed and contact our office if you believe you are having problem(s) with the medication(s).  Other medications reviewed - no other changes  Alternate between ibuprofen and tylenol for aches, pain and fever symptoms as discussed  Hydrate, rest and call if worse or unimproved  Sinusitis Sinusitis is redness, soreness, and puffiness (inflammation) of the air pockets in the bones of your face (sinuses). The redness, soreness, and puffiness can cause air and mucus to get trapped in your sinuses. This can allow germs to grow and cause an infection.  HOME CARE   Drink enough fluids to keep your pee (urine) clear or pale yellow.  Use a humidifier in your home.  Run a hot shower to create steam in the bathroom. Sit in the bathroom with the door closed. Breathe in the steam 3-4 times a day.  Put a warm, moist washcloth on your face 3-4 times a day, or as told by your doctor.  Use salt water sprays (saline sprays) to wet the thick fluid in your nose. This can help the sinuses drain.  Only take medicine as told by your doctor. GET HELP RIGHT AWAY IF:   Your pain gets worse.  You have very bad headaches.  You are sick to your stomach (nauseous).  You throw up (vomit).  You are very sleepy (drowsy) all the time.  Your face is puffy (swollen).  Your vision changes.  You have a stiff neck.  You have trouble breathing. MAKE SURE YOU:   Understand these instructions.  Will watch your condition.  Will get help right away if you are not doing well or get worse. Document Released: 05/21/2008 Document Revised: 08/27/2012 Document Reviewed: 07/08/2012 [facility] Patient Information 2015 [place], [place]. This information is not intended to replace advice given to you by your health care provider. Make sure you discuss any  questions you have with your health care provider.

## 2015-05-04 NOTE — Progress Notes (Signed)
Pre visit review using our clinic review tool, if applicable. No additional management support is needed unless otherwise documented below in the visit note. 

## 2015-05-04 NOTE — Assessment & Plan Note (Signed)
On statin and fenofibrate - dose increased 10/2011. Temporary hold statin with acute viral illness and inc LFTs mid 12/2013, then resumed normalization Check annually, patient will return this week for labs  The patient is asked to make an attempt to improve diet and exercise patterns to aid in medical management of this problem.

## 2015-05-05 ENCOUNTER — Other Ambulatory Visit (INDEPENDENT_AMBULATORY_CARE_PROVIDER_SITE_OTHER): Payer: 59

## 2015-05-05 DIAGNOSIS — E785 Hyperlipidemia, unspecified: Secondary | ICD-10-CM | POA: Diagnosis not present

## 2015-05-05 LAB — LIPID PANEL
Cholesterol: 150 mg/dL (ref 0–200)
HDL: 34 mg/dL — AB (ref >39.00)
LDL Cholesterol: 82 mg/dL (ref 0–99)
NonHDL: 116
Total CHOL/HDL Ratio: 4
Triglycerides: 172 mg/dL — ABNORMAL HIGH (ref 0.0–149.0)
VLDL: 34.4 mg/dL (ref 0.0–40.0)

## 2015-05-27 ENCOUNTER — Encounter: Payer: Self-pay | Admitting: Internal Medicine

## 2015-05-27 ENCOUNTER — Ambulatory Visit (INDEPENDENT_AMBULATORY_CARE_PROVIDER_SITE_OTHER): Payer: 59 | Admitting: Internal Medicine

## 2015-05-27 VITALS — BP 130/78 | HR 72 | Temp 98.3°F | Resp 18 | Ht 67.0 in | Wt 218.0 lb

## 2015-05-27 DIAGNOSIS — M7021 Olecranon bursitis, right elbow: Secondary | ICD-10-CM

## 2015-05-27 MED ORDER — METHYLPREDNISOLONE ACETATE 80 MG/ML IJ SUSP
80.0000 mg | Freq: Once | INTRAMUSCULAR | Status: AC
Start: 2015-05-27 — End: 2015-05-27
  Administered 2015-05-27: 80 mg via INTRAMUSCULAR

## 2015-05-27 NOTE — Patient Instructions (Signed)
We have given you a steroid shot today which should help with the swelling. I would advise taking ibuprofen 600 mg three times a day for the next 5 days then after that only as needed for pain. Keep using ice on the area to take away the pain and help the swelling to decrease.   If the swelling worsens or the pain does not resolve within 1-2 weeks call us back.   Olecranon Bursitis Bursitis is swelling and soreness (inflammation) of a fluid-filled sac (bursa) that covers and protects a joint. Olecranon bursitis occurs over the elbow.  CAUSES Bursitis can be caused by injury, overuse of the joint, arthritis, or infection.  SYMPTOMS   Tenderness, swelling, warmth, or redness over the elbow.  Elbow pain with movement. This is greater with bending the elbow.  Squeaking sound when the bursa is rubbed or moved.  Increasing size of the bursa without pain or discomfort.  Fever with increasing pain and swelling if the bursa becomes infected. HOME CARE INSTRUCTIONS   Put ice on the affected area.  Put ice in a plastic bag.  Place a towel between your skin and the bag.  Leave the ice on for 15-20 minutes each hour while awake. Do this for the first 2 days.  When resting, elevate your elbow above the level of your heart. This helps reduce swelling.  Continue to put the joint through a full range of motion 4 times per day. Rest the injured joint at other times. When the pain lessens, begin normal slow movements and usual activities.  Only take over-the-counter or prescription medicines for pain, discomfort, or fever as directed by your caregiver.  Reduce your intake of milk and related dairy products (cheese, yogurt). They may make your condition worse. SEEK IMMEDIATE MEDICAL CARE IF:   Your pain increases even during treatment.  You have a fever.  You have heat and inflammation over the bursa and elbow.  You have a red line that goes up your arm.  You have pain with movement of your  elbow. MAKE SURE YOU:   Understand these instructions.  Will watch your condition.  Will get help right away if you are not doing well or get worse. Document Released: 01/02/2007 Document Revised: 02/25/2012 Document Reviewed: 11/18/2007 [facility] Patient Information 2015 [place], [place]. This information is not intended to replace advice given to you by your health care provider. Make sure you discuss any questions you have with your health care provider.

## 2015-05-27 NOTE — Progress Notes (Signed)
   Subjective:    Patient ID: Jason Phillips, male    DOB: [birth year], 34 y.o.   MRN: [id number]  HPI The patient is a 35 YO man who is coming for right elbow pain. Going on for about 1 week, was power washing the house before it started. Some swelling around the elbow. Not limiting motion. Pain 5/10 at rest and 10/10 with activity. Has taken some ibuprofen sporadically for pain. Mild relief. Has been using ice which has been helpful. No rash or fevers or chills.   Review of Systems  Constitutional: Negative for fever, activity change, appetite change and fatigue.  Respiratory: Negative.   Cardiovascular: Negative.   Gastrointestinal: Negative.   Musculoskeletal: Positive for myalgias and arthralgias. Negative for back pain and gait problem.      Objective:   Physical Exam  Constitutional: He appears well-developed and well-nourished.  HENT:  Head: Normocephalic and atraumatic.  Cardiovascular: Normal rate and regular rhythm.   Pulmonary/Chest: Effort normal and breath sounds normal.  Abdominal: Soft.  Musculoskeletal: He exhibits tenderness.  Mild swelling around the olecranon bursa, no rash on the skin, full ROM of elbow, wrist and shoulder, strength intact.   Skin: Skin is warm and dry.   Filed Vitals:   05/27/15 1337  BP: 130/78  Pulse: 72  Temp: 98.3 F (36.8 C)  TempSrc: Oral  Resp: 18  Height: 5\' 7"  (1.702 m)  Weight: 218 lb (98.884 kg)  SpO2: 97%      Assessment & Plan:

## 2015-05-27 NOTE — Progress Notes (Signed)
Pre visit review using our clinic review tool, if applicable. No additional management support is needed unless otherwise documented below in the visit note. 

## 2015-05-27 NOTE — Assessment & Plan Note (Signed)
Depo-medrol 80 mg IM today, ibuprofen 600 mg TID for the next 5 days and then prn for pain. He will continue icing.

## 2015-07-14 ENCOUNTER — Other Ambulatory Visit: Payer: Self-pay | Admitting: Internal Medicine

## 2015-09-30 ENCOUNTER — Encounter: Payer: Self-pay | Admitting: Internal Medicine

## 2015-09-30 ENCOUNTER — Ambulatory Visit (INDEPENDENT_AMBULATORY_CARE_PROVIDER_SITE_OTHER): Payer: 59 | Admitting: Internal Medicine

## 2015-09-30 VITALS — BP 118/80 | HR 68 | Temp 97.9°F | Ht 67.0 in | Wt 216.0 lb

## 2015-09-30 DIAGNOSIS — Z23 Encounter for immunization: Secondary | ICD-10-CM

## 2015-09-30 DIAGNOSIS — M659 Synovitis and tenosynovitis, unspecified: Secondary | ICD-10-CM
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
## 2015-09-30 NOTE — Progress Notes (Signed)
Pre visit review using our clinic review tool, if applicable. No additional management support is needed unless otherwise documented below in the visit note. 

## 2015-09-30 NOTE — Patient Instructions (Addendum)
Perform the exercises for elbow pain  twice a day as discussed.  Use an anti-inflammatory cream such as Aspercreme or Zostrix cream twice a day to the affected area after the exercise as needed. In lieu of this warm moist compresses or  hot water bottle can be used. Do not apply ice .  Consider glucosamine sulfate 1500 mg daily for joint symptoms. Take this daily  for 3 months and then leave it off for 2 months. This will rehydrate the cartilages.

## 2015-11-17 ENCOUNTER — Ambulatory Visit: Payer: 59 | Admitting: Family Medicine

## 2015-11-24 ENCOUNTER — Encounter: Payer: Self-pay | Admitting: Family Medicine

## 2015-11-24 ENCOUNTER — Ambulatory Visit (INDEPENDENT_AMBULATORY_CARE_PROVIDER_SITE_OTHER): Payer: 59 | Admitting: Family Medicine

## 2015-11-24 ENCOUNTER — Other Ambulatory Visit (INDEPENDENT_AMBULATORY_CARE_PROVIDER_SITE_OTHER): Payer: 59

## 2015-11-24 VITALS — BP 136/82 | HR 80 | Ht 67.0 in | Wt 215.0 lb

## 2015-11-24 DIAGNOSIS — M7711 Lateral epicondylitis, right elbow: Secondary | ICD-10-CM | POA: Insufficient documentation

## 2015-11-24 DIAGNOSIS — M25521 Pain in right elbow: Secondary | ICD-10-CM

## 2015-11-24 NOTE — Progress Notes (Signed)
Pre visit review using our clinic review tool, if applicable. No additional management support is needed unless otherwise documented below in the visit note. 
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
## 2015-11-24 NOTE — Patient Instructions (Signed)
Good to see you Ice 20 minutes 2 times daily. Usually after activity and before bed. Wear wrist brace day and night for 1 week then nightly for 2 weeks.  Duexis 3 times daily for 3 days for 3 days pennsaid pinkie amount topically 2 times daily as needed.  Lift with palms up or thumbs up Avoid heavy lifting.  See me again in 4 weeks Happy holidays!

## 2015-11-24 NOTE — Assessment & Plan Note (Signed)
Lateral Epicondylitis: Elbow anatomy was reviewed, and tendinopathy was explained.  Pt. given a formal rehab program. Series of concentric and eccentric exercises should be done starting with no weight, work up to 1 lb, hammer, etc.  Use counterforce strap if working or using hands.  Formal PT would be beneficial. Emphasized stretching an cross-friction massage Emphasized proper palms up lifting biomechanics to unload ECRB Patient will wear the wrist brace to avoid any type of extension. Patient will come back again in 3-4 weeks for further evaluation and treatment. Topical anti-inflammatories given.

## 2015-12-14 ENCOUNTER — Telehealth: Payer: Self-pay | Admitting: Family Medicine

## 2015-12-14 NOTE — Telephone Encounter (Signed)
Patient has pulled something in neck.  He is looking to get in to be seen as soon as possible.  Please follow up with patient.

## 2015-12-15 NOTE — Telephone Encounter (Signed)
Spoke to pt, offered him appointment tomorrow at 8am. Pt declined & said he was feeling better & he will keep his appt on 1.5.16.

## 2015-12-22 ENCOUNTER — Encounter: Payer: Self-pay | Admitting: Family Medicine

## 2015-12-22 ENCOUNTER — Ambulatory Visit (INDEPENDENT_AMBULATORY_CARE_PROVIDER_SITE_OTHER): Payer: 59 | Admitting: Family Medicine

## 2015-12-22 VITALS — BP 124/82 | HR 81 | Ht 67.0 in | Wt 215.0 lb

## 2015-12-22 DIAGNOSIS — M7711 Lateral epicondylitis, right elbow: Secondary | ICD-10-CM

## 2015-12-22 MED ORDER — DICLOFENAC SODIUM 2 % TD SOLN: TRANSDERMAL | Status: DC

## 2015-12-22 NOTE — Assessment & Plan Note (Signed)
Vision is much better overall. We discussed icing regimen. Patient is going to do prescription topical anti-inflammatories and was sent in today. We discussed continuing the home exercises and more of a strengthening phase to exercise program was given. We discussed bracing for any type of exacerbation. Patient will continue with the proper lifting mechanics. Patient will come back in 6 weeks if any exacerbation otherwise can follow-up on an as-needed basis.  Answered many questions.  Spent  25 minutes with patient face-to-face and had greater than 50% of counseling including as described above in assessment and plan.
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
## 2015-12-22 NOTE — Progress Notes (Signed)
Pre visit review using our clinic review tool, if applicable. No additional management support is needed unless otherwise documented below in the visit note. 

## 2015-12-22 NOTE — Patient Instructions (Signed)
Good to see you \\Keep  doing the exercises 2-3 times a week Pennsaid at night if fine and will be great Wear brace if any exacerbation occurs for 3-5 days See me again when you need me.

## 2016-02-29 ENCOUNTER — Ambulatory Visit (INDEPENDENT_AMBULATORY_CARE_PROVIDER_SITE_OTHER): Payer: 59 | Admitting: Internal Medicine

## 2016-02-29 ENCOUNTER — Encounter: Payer: Self-pay | Admitting: Internal Medicine

## 2016-02-29 VITALS — BP 118/68 | HR 59 | Temp 98.9°F | Resp 20 | Wt 216.0 lb

## 2016-02-29 DIAGNOSIS — G43809 Other migraine, not intractable, without status migrainosus: Secondary | ICD-10-CM

## 2016-02-29 DIAGNOSIS — G43909 Migraine, unspecified, not intractable, without status migrainosus: Secondary | ICD-10-CM | POA: Insufficient documentation

## 2016-02-29 MED ORDER — NORTRIPTYLINE HCL 10 MG PO CAPS: 30.0000 mg | ORAL_CAPSULE | Freq: Every day | ORAL | Status: DC

## 2016-02-29 MED ORDER — SUMATRIPTAN SUCCINATE 100 MG PO TABS: 100.0000 mg | ORAL_TABLET | ORAL | Status: DC | PRN

## 2016-02-29 NOTE — Progress Notes (Signed)
Pre visit review using our clinic review tool, if applicable. No additional management support is needed unless otherwise documented below in the visit note. 
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
## 2016-02-29 NOTE — Patient Instructions (Signed)
Please take all new medication as prescribed - the imitrex as needed  Please also start the Pamelor at 1 cap at bedtime for 1 wk, then 2 capsules at bedtime for 1 wk, then 3 capsules at bedtime after that  Please continue all other medications as before, and refills have been done if requested.  Please have the pharmacy call with any other refills you may need.  Please keep your appointments with your specialists as you may have planned  Please call in 3 weeks if not improved, for referral to Headache Specialist

## 2016-03-03 NOTE — Assessment & Plan Note (Addendum)
Mild to mod with increased freq,, for imitrex prn, also pamelor 10 qd with titration to 30 qhs for preventive,  to f/u any worsening symptoms or concerns, neuro exam stable- will hold on imaging for now , consider referral [facility]

## 2016-03-06 IMAGING — CR DG LUMBAR SPINE COMPLETE 4+V
5 series · 5 of 5 positions shown · non-contrast
Comparison: None.

CLINICAL DATA: Fall from 6 feet height yesterday. Pain. Initial
evaluation .

EXAM:
LUMBAR SPINE - COMPLETE 4+ VIEW

[view not recorded (1 of 5)]
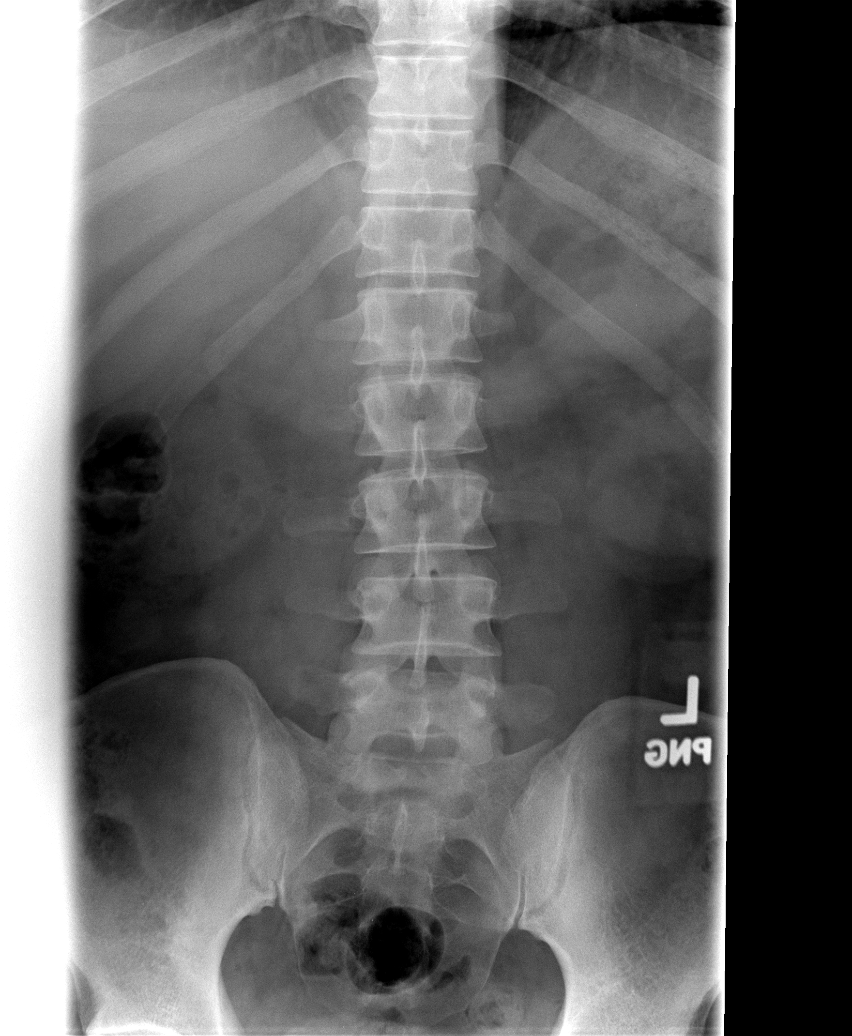

[view not recorded (2 of 5)]
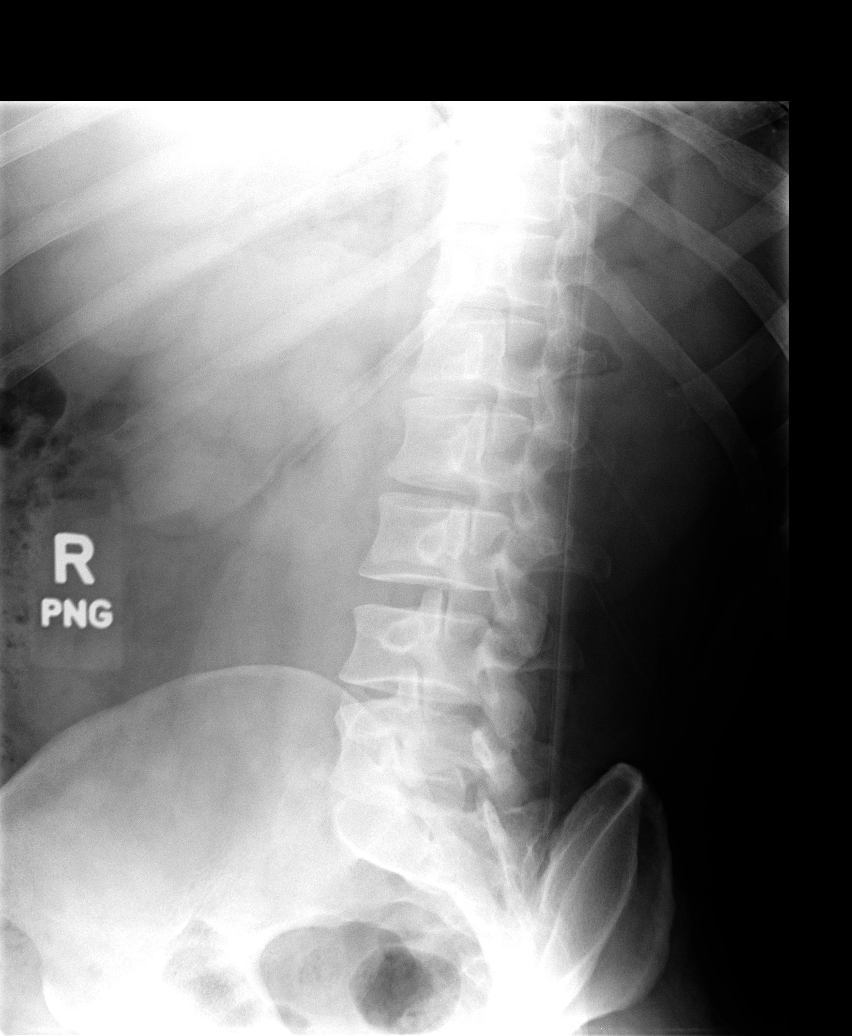

[view not recorded (3 of 5)]
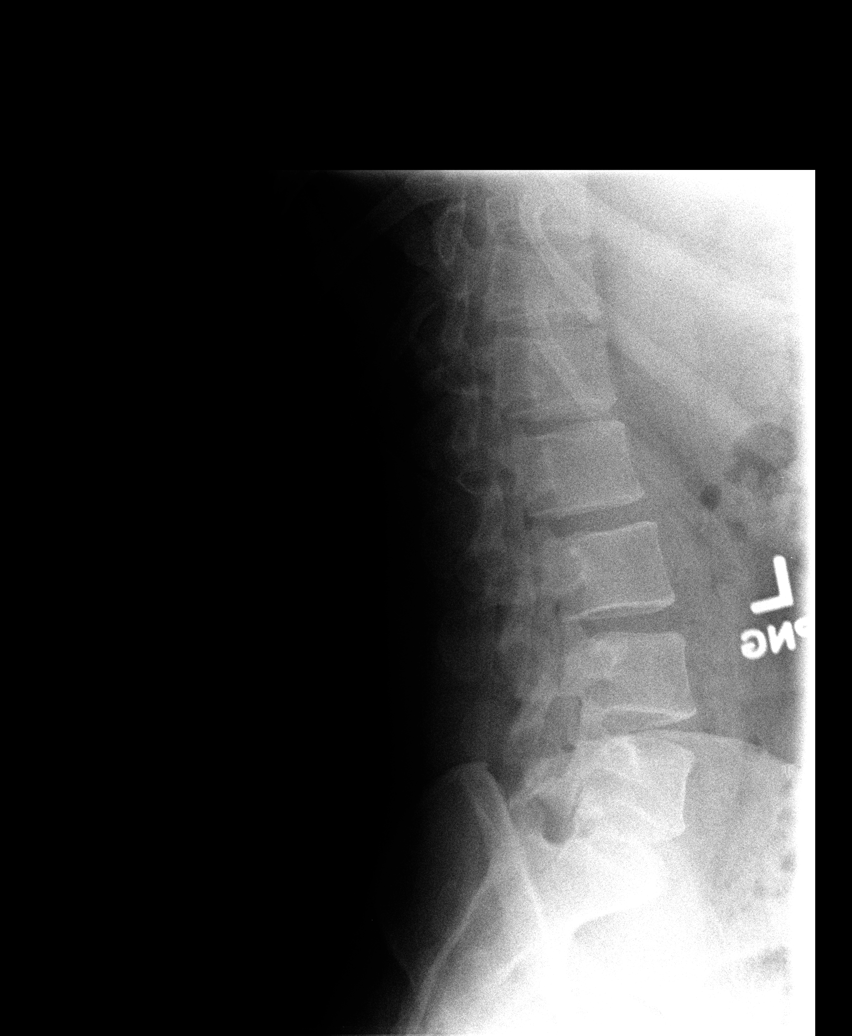

[view not recorded (4 of 5)]
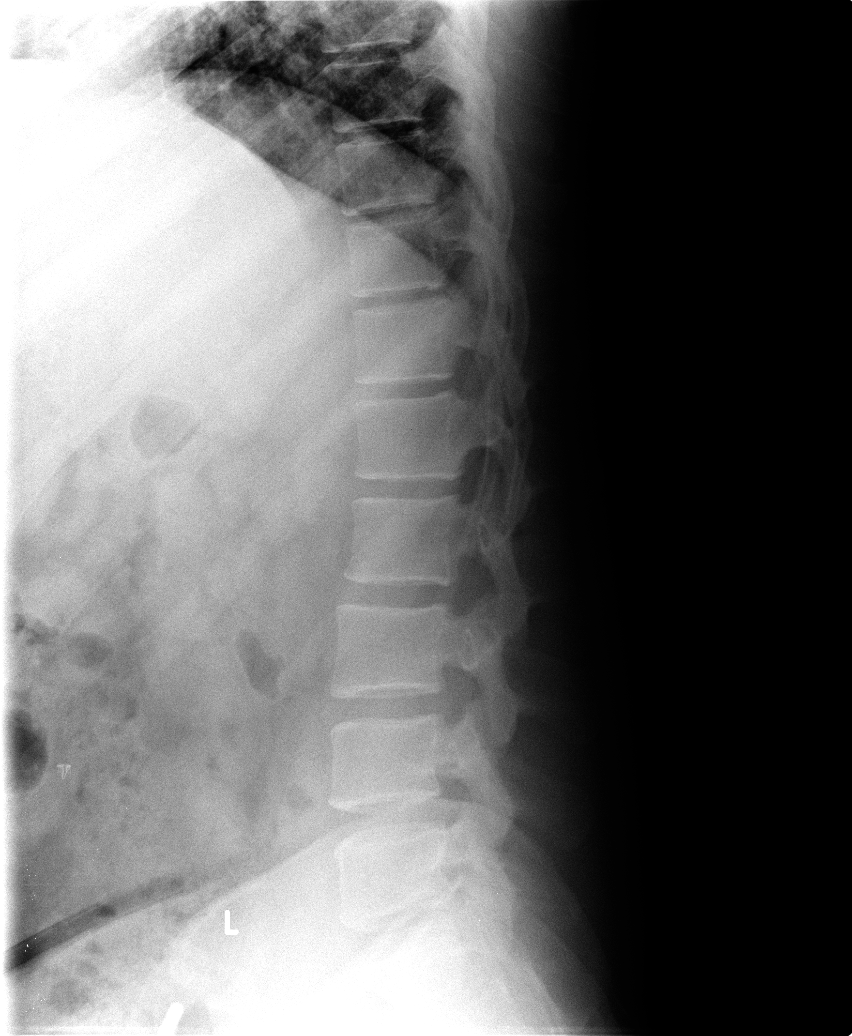

[view not recorded (5 of 5)]
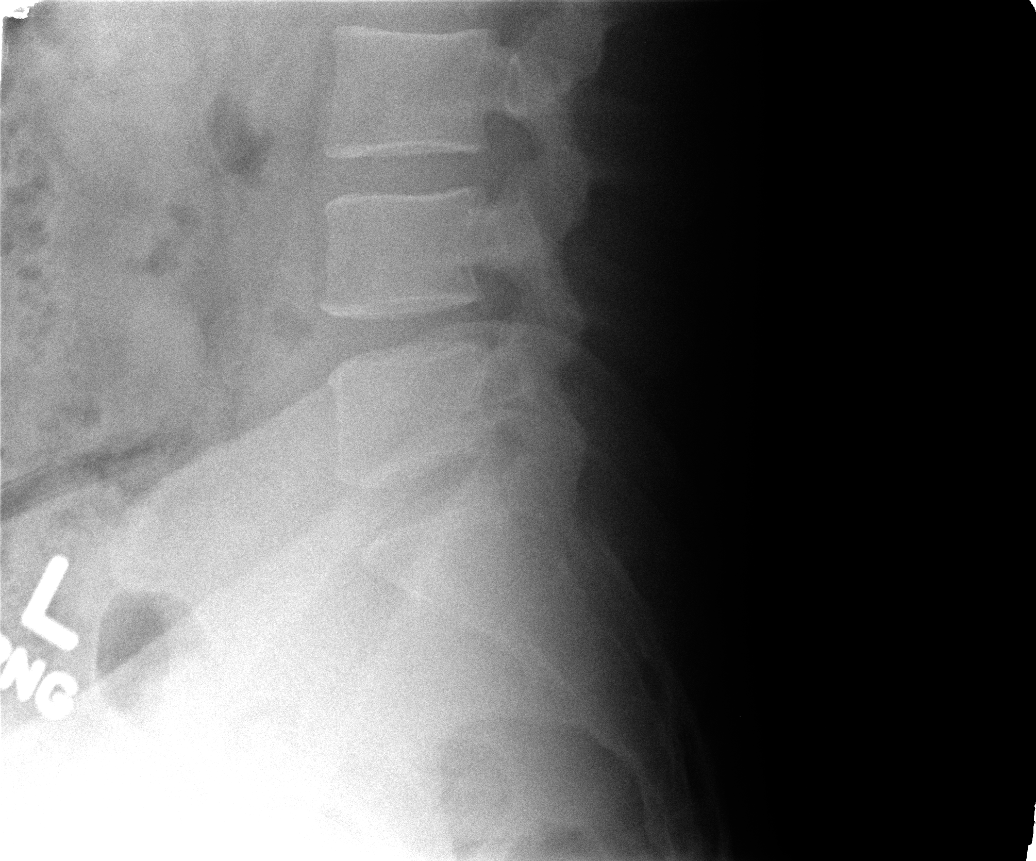

[5 of 5 positions shown; findings below may reference images not displayed]

FINDINGS: Paraspinal soft tissues are normal. No acute bony abnormality.
Pedicles are intact.
IMPRESSION: No acute abnormality.

## 2016-03-06 IMAGING — CR DG CERVICAL SPINE COMPLETE 4+V
5 series · 5 of 5 positions shown · non-contrast
Comparison: None.

CLINICAL DATA: Fall from 6 feet high yesterday. Initial evaluation.

EXAM:
CERVICAL SPINE  4+ VIEWS

[view not recorded (1 of 5)]
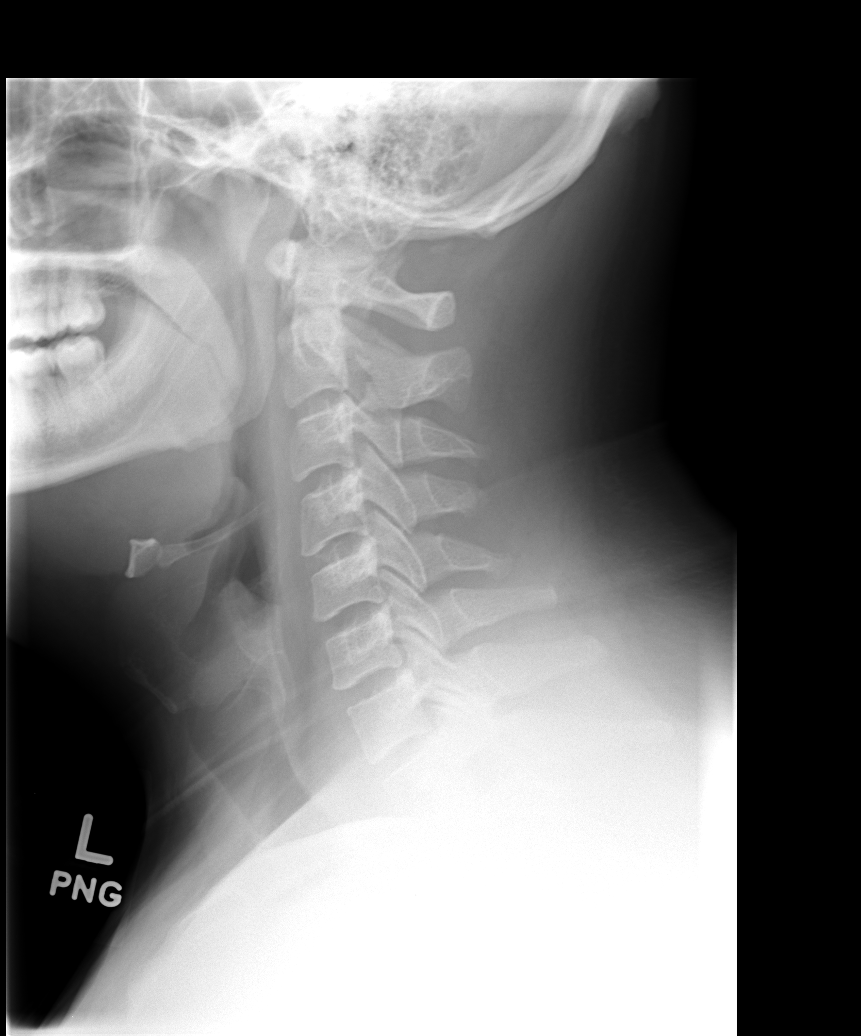

[view not recorded (2 of 5)]
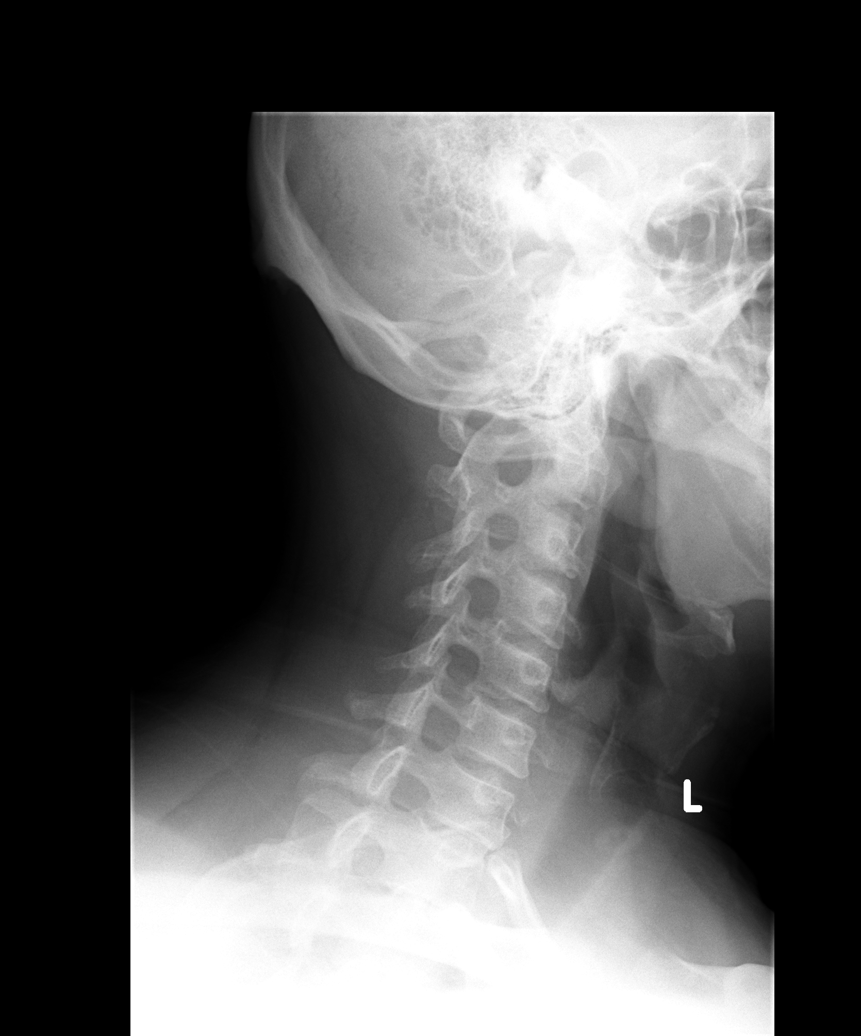

[view not recorded (3 of 5)]
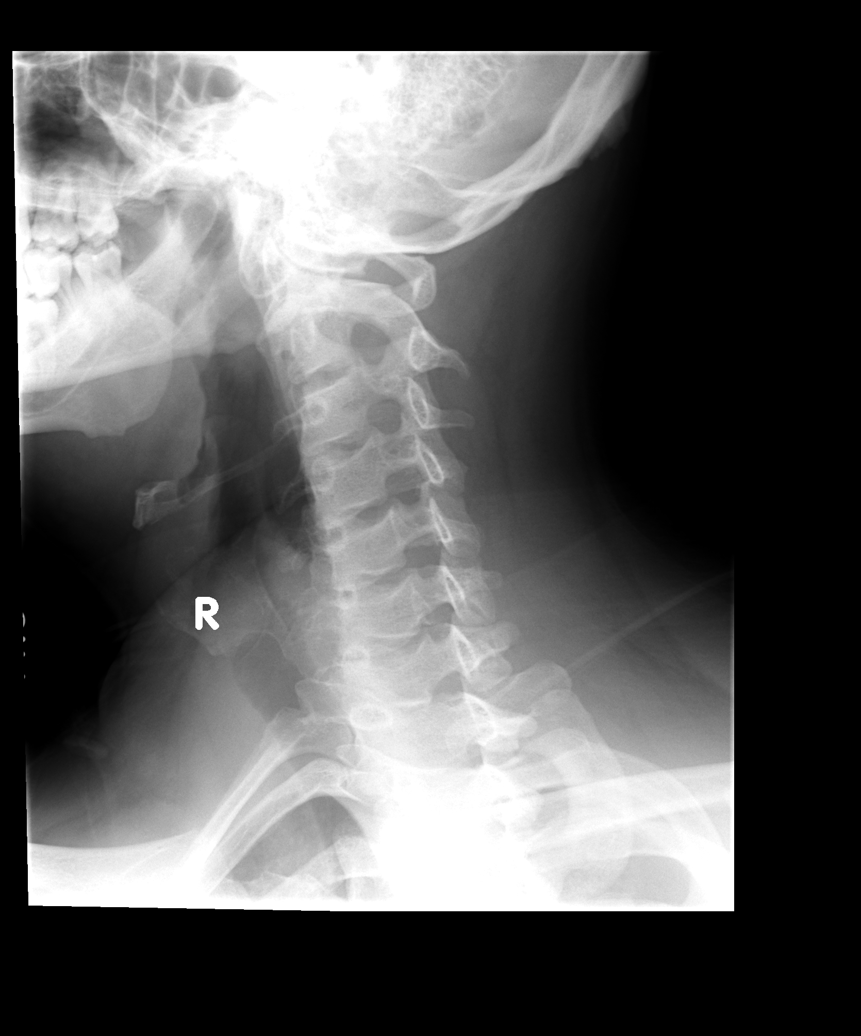

[view not recorded (4 of 5)]
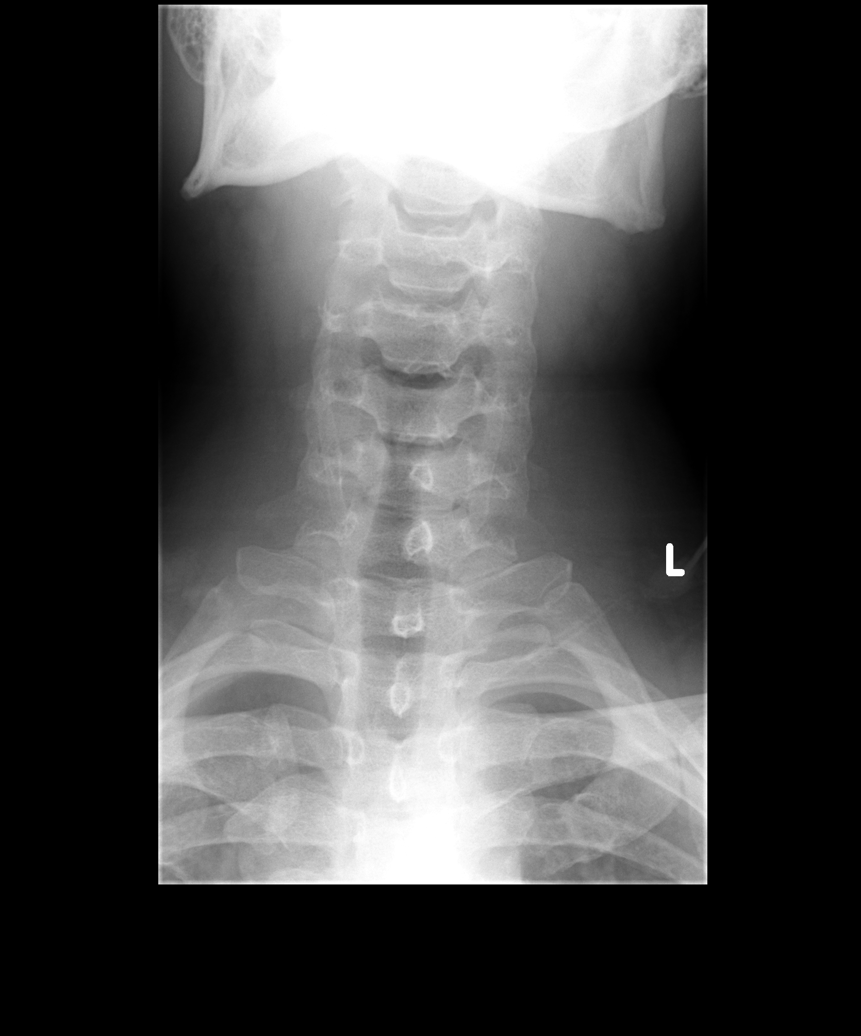

[view not recorded (5 of 5)]
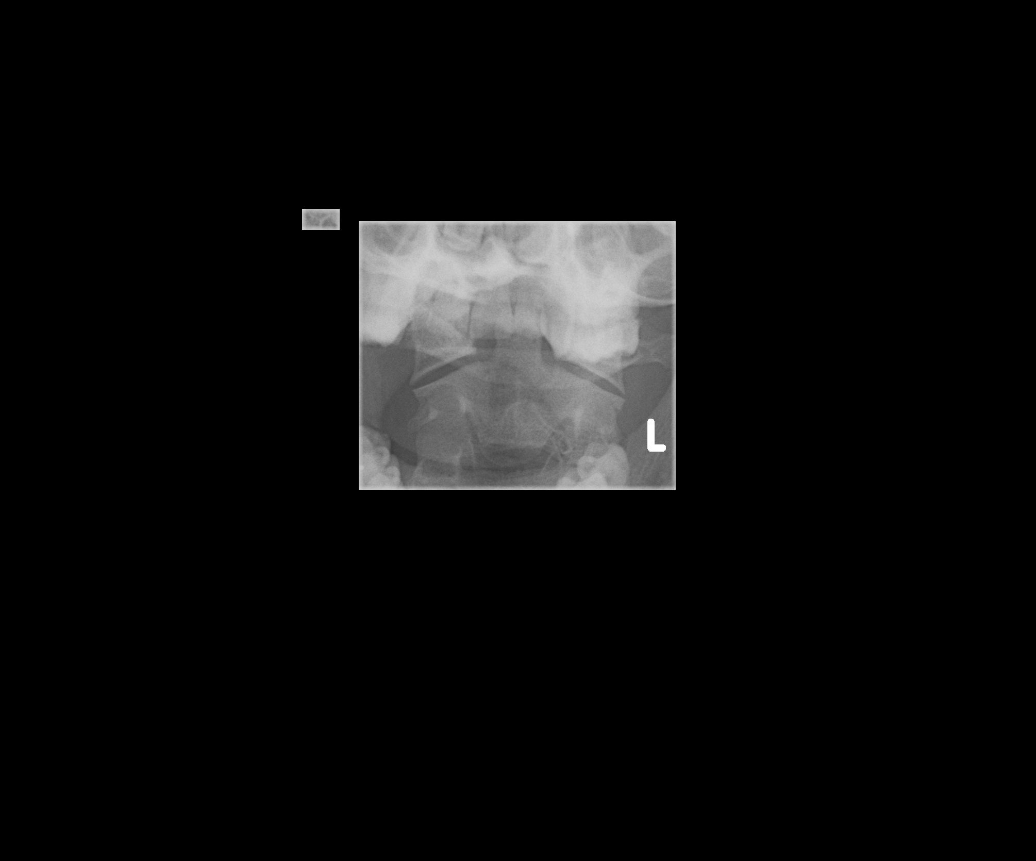

[5 of 5 positions shown; findings below may reference images not displayed]

FINDINGS: No acute bony abnormality identified.

No fracture or dislocation.  Normal bony alignment noted.
IMPRESSION: Negative cervical spine radiographs.

## 2016-04-06 ENCOUNTER — Other Ambulatory Visit: Payer: Self-pay | Admitting: Internal Medicine

## 2016-04-09 ENCOUNTER — Other Ambulatory Visit: Payer: Self-pay | Admitting: Internal Medicine

## 2016-04-16 ENCOUNTER — Other Ambulatory Visit: Payer: Self-pay | Admitting: Geriatric Medicine

## 2016-04-16 ENCOUNTER — Encounter: Payer: Self-pay | Admitting: Internal Medicine

## 2016-04-16 MED ORDER — FENOFIBRATE 160 MG PO TABS: 160.0000 mg | ORAL_TABLET | Freq: Every day | ORAL | Status: DC

## 2016-05-15 ENCOUNTER — Other Ambulatory Visit (INDEPENDENT_AMBULATORY_CARE_PROVIDER_SITE_OTHER): Payer: 59

## 2016-05-15 ENCOUNTER — Ambulatory Visit (INDEPENDENT_AMBULATORY_CARE_PROVIDER_SITE_OTHER): Payer: 59 | Admitting: Internal Medicine

## 2016-05-15 ENCOUNTER — Encounter: Payer: Self-pay | Admitting: Internal Medicine

## 2016-05-15 VITALS — BP 128/86 | HR 65 | Temp 98.7°F | Resp 18 | Ht 67.5 in | Wt 219.0 lb

## 2016-05-15 DIAGNOSIS — R7989 Other specified abnormal findings of blood chemistry: Secondary | ICD-10-CM

## 2016-05-15 DIAGNOSIS — Z0000 Encounter for general adult medical examination without abnormal findings: Secondary | ICD-10-CM | POA: Diagnosis not present

## 2016-05-15 DIAGNOSIS — F39 Unspecified mood [affective] disorder: Secondary | ICD-10-CM | POA: Diagnosis not present

## 2016-05-15 DIAGNOSIS — E785 Hyperlipidemia, unspecified: Secondary | ICD-10-CM | POA: Diagnosis not present

## 2016-05-15 LAB — CBC
HCT: 39.9 % (ref 39.0–52.0)
Hemoglobin: 13.8 g/dL (ref 13.0–17.0)
MCHC: 34.5 g/dL (ref 30.0–36.0)
MCV: 86.3 fl (ref 78.0–100.0)
PLATELETS: 381 10*3/uL (ref 150.0–400.0)
RBC: 4.63 Mil/uL (ref 4.22–5.81)
RDW: 13 % (ref 11.5–15.5)
WBC: 7.3 10*3/uL (ref 4.0–10.5)

## 2016-05-15 LAB — LIPID PANEL
CHOLESTEROL: 264 mg/dL — AB (ref 0–200)
HDL: 37.7 mg/dL — AB (ref >39.00)
NONHDL: 225.93
Total CHOL/HDL Ratio: 7
Triglycerides: 299 mg/dL — ABNORMAL HIGH (ref 0.0–149.0)
VLDL: 59.8 mg/dL — ABNORMAL HIGH (ref 0.0–40.0)

## 2016-05-15 LAB — LDL CHOLESTEROL, DIRECT: Direct LDL: 154 mg/dL

## 2016-05-15 LAB — TSH: TSH: 2.33 u[IU]/mL (ref 0.35–4.50)

## 2016-05-15 LAB — COMPREHENSIVE METABOLIC PANEL
ALT: 21 U/L (ref 0–53)
AST: 20 U/L (ref 0–37)
Albumin: 4.5 g/dL (ref 3.5–5.2)
Alkaline Phosphatase: 39 U/L (ref 39–117)
BILIRUBIN TOTAL: 0.5 mg/dL (ref 0.2–1.2)
BUN: 10 mg/dL (ref 6–23)
CALCIUM: 9.9 mg/dL (ref 8.4–10.5)
CO2: 24 mEq/L (ref 19–32)
Chloride: 109 mEq/L (ref 96–112)
Creatinine, Ser: 0.74 mg/dL (ref 0.40–1.50)
GFR: 126.63 mL/min (ref >60.00)
Glucose, Bld: 111 mg/dL — ABNORMAL HIGH (ref 70–99)
Potassium: 4.1 mEq/L (ref 3.5–5.1)
Sodium: 142 mEq/L (ref 135–145)
TOTAL PROTEIN: 7.3 g/dL (ref 6.0–8.3)

## 2016-05-15 LAB — HEMOGLOBIN A1C: Hgb A1c MFr Bld: 6.4 % (ref 4.6–6.5)

## 2016-05-15 MED ORDER — FLUOXETINE HCL 20 MG PO CAPS: 40.0000 mg | ORAL_CAPSULE | Freq: Every day | ORAL | Status: DC

## 2016-05-15 MED ORDER — FENOFIBRATE 160 MG PO TABS: 160.0000 mg | ORAL_TABLET | Freq: Every day | ORAL | Status: DC

## 2016-05-15 NOTE — Progress Notes (Signed)
Pre visit review using our clinic review tool, if applicable. No additional management support is needed unless otherwise documented below in the visit note. 

## 2016-05-15 NOTE — Progress Notes (Signed)
   Subjective:    Patient ID: Jason Phillips, male    DOB: 1981/08/31, 35 y.o.   MRN: [id number]  HPI The patient is a 35 YO man coming in for wellness. Takes prozac for temper and is doing well with that. No side effects. Gets myalgia severe with crestor and several other statins.   [facility], social history reviewed and updated.   Review of Systems  Constitutional: Negative for fever, activity change, appetite change and fatigue.  HENT: Positive for hearing loss.   Eyes: Negative.   Respiratory: Negative.   Cardiovascular: Negative.   Gastrointestinal: Negative.   Musculoskeletal: Positive for arthralgias. Negative for myalgias, back pain and gait problem.  Skin: Negative.   Neurological: Negative.   Psychiatric/Behavioral: Negative.       Objective:   Physical Exam  Constitutional: He is oriented to person, place, and time. He appears well-developed and well-nourished.  HENT:  Head: Normocephalic and atraumatic.  Eyes: EOM are normal.  Neck: Normal range of motion. No thyromegaly present.  Cardiovascular: Normal rate and regular rhythm.   Pulmonary/Chest: Effort normal and breath sounds normal. No respiratory distress. He has no wheezes.  Abdominal: Soft. Bowel sounds are normal. He exhibits no distension. There is no tenderness. There is no rebound.  Musculoskeletal: He exhibits no edema.  Neurological: He is alert and oriented to person, place, and time. Coordination normal.  Skin: Skin is warm and dry.  Psychiatric: He has a normal mood and affect.   Filed Vitals:   05/15/16 0811  BP: 128/86  Pulse: 65  Temp: 98.7 F (37.1 C)  TempSrc: Oral  Resp: 18  Height: 5' 7.5" (1.715 m)  Weight: 219 lb (99.338 kg)  SpO2: 98%      Assessment & Plan:

## 2016-05-15 NOTE — Patient Instructions (Signed)
We are checking the labs today and will send the results on mychart.   We have sent in the refills.   Consider working on some exercise on the days you are not working to help with the cholesterol.   Health Maintenance, Male A healthy lifestyle and preventative care can promote health and wellness.  Maintain regular health, dental, and eye exams.  Eat a healthy diet. Foods like vegetables, fruits, whole grains, low-fat dairy products, and lean protein foods contain the nutrients you need and are low in calories. Decrease your intake of foods high in solid fats, added sugars, and salt. Get information about a proper diet from your health care provider, if necessary.  Regular physical exercise is one of the most important things you can do for your health. Most adults should get at least 150 minutes of moderate-intensity exercise (any activity that increases your heart rate and causes you to sweat) each week. In addition, most adults need muscle-strengthening exercises on 2 or more days a week.   Maintain a healthy weight. The body mass index (BMI) is a screening tool to identify possible weight problems. It provides an estimate of body fat based on height and weight. Your health care provider can find your BMI and can help you achieve or maintain a healthy weight. For males 20 years and older:  A BMI below 18.5 is considered underweight.  A BMI of 18.5 to 24.9 is normal.  A BMI of 25 to 29.9 is considered overweight.  A BMI of 30 and above is considered obese.  Maintain normal blood lipids and cholesterol by exercising and minimizing your intake of saturated fat. Eat a balanced diet with plenty of fruits and vegetables. Blood tests for lipids and cholesterol should begin at age 37 and be repeated every 5 years. If your lipid or cholesterol levels are high, you are over age 37, or you are at high risk for heart disease, you may need your cholesterol levels checked more frequently.Ongoing  high lipid and cholesterol levels should be treated with medicines if diet and exercise are not working.  If you smoke, find out from your health care provider how to quit. If you do not use tobacco, do not start.  Lung cancer screening is recommended for adults aged 55-80 years who are at high risk for developing lung cancer because of a history of smoking. A yearly low-dose CT scan of the lungs is recommended for people who have at least a 30-pack-year history of smoking and are current smokers or have quit within the past 15 years. A pack year of smoking is smoking an average of 1 pack of cigarettes a day for 1 year (for example, a 30-pack-year history of smoking could mean smoking 1 pack a day for 30 years or 2 packs a day for 15 years). Yearly screening should continue until the smoker has stopped smoking for at least 15 years. Yearly screening should be stopped for people who develop a health problem that would prevent them from having lung cancer treatment.  If you choose to drink alcohol, do not have more than 2 drinks per day. One drink is considered to be 12 oz (360 mL) of beer, 5 oz (150 mL) of wine, or 1.5 oz (45 mL) of liquor.  Avoid the use of street drugs. Do not share needles with anyone. Ask for help if you need support or instructions about stopping the use of drugs.  High blood pressure causes heart disease and increases the  risk of stroke. High blood pressure is more likely to develop in:  People who have blood pressure in the end of the normal range (100-139/85-89 mm Hg).  People who are overweight or obese.  People who are African American.  If you are 81-61 years of age, have your blood pressure checked every 3-5 years. If you are 36 years of age or older, have your blood pressure checked every year. You should have your blood pressure measured twice--once when you are at a hospital or clinic, and once when you are not at a hospital or clinic. Record the average of the two  measurements. To check your blood pressure when you are not at a hospital or clinic, you can use:  An automated blood pressure machine at a pharmacy.  A home blood pressure monitor.  If you are 53-40 years old, ask your health care provider if you should take aspirin to prevent heart disease.  Diabetes screening involves taking a blood sample to check your fasting blood sugar level. This should be done once every 3 years after age 26 if you are at a normal weight and without risk factors for diabetes. Testing should be considered at a younger age or be carried out more frequently if you are overweight and have at least 1 risk factor for diabetes.  Colorectal cancer can be detected and often prevented. Most routine colorectal cancer screening begins at the age of 11 and continues through age 79. However, your health care provider may recommend screening at an earlier age if you have risk factors for colon cancer. On a yearly basis, your health care provider may provide home test kits to check for hidden blood in the stool. A small camera at the end of a tube may be used to directly examine the colon (sigmoidoscopy or colonoscopy) to detect the earliest forms of colorectal cancer. Talk to your health care provider about this at age 53 when routine screening begins. A direct exam of the colon should be repeated every 5-10 years through age 46, unless early forms of precancerous polyps or small growths are found.  People who are at an increased risk for hepatitis B should be screened for this virus. You are considered at high risk for hepatitis B if:  You were born in a country where hepatitis B occurs often. Talk with your health care provider about which countries are considered high risk.  Your parents were born in a high-risk country and you have not received a shot to protect against hepatitis B (hepatitis B vaccine).  You have HIV or AIDS.  You use needles to inject street drugs.  You live  with, or have sex with, someone who has hepatitis B.  You are a man who has sex with other men (MSM).  You get hemodialysis treatment.  You take certain medicines for conditions like cancer, organ transplantation, and autoimmune conditions.  Hepatitis C blood testing is recommended for all people born from [birth year] through 1967 and any individual with known risk factors for hepatitis C.  Healthy men should no longer receive prostate-specific antigen (PSA) blood tests as part of routine cancer screening. Talk to your health care provider about prostate cancer screening.  Testicular cancer screening is not recommended for adolescents or adult males who have no symptoms. Screening includes self-exam, a health care provider exam, and other screening tests. Consult with your health care provider about any symptoms you have or any concerns you have about testicular cancer.  Practice safe  sex. Use condoms and avoid high-risk sexual practices to reduce the spread of sexually transmitted infections (STIs).  You should be screened for STIs, including gonorrhea and chlamydia if:  You are sexually active and are younger than 24 years.  You are older than 24 years, and your health care provider tells you that you are at risk for this type of infection.  Your sexual activity has changed since you were last screened, and you are at an increased risk for chlamydia or gonorrhea. Ask your health care provider if you are at risk.  If you are at risk of being infected with HIV, it is recommended that you take a prescription medicine daily to prevent HIV infection. This is called pre-exposure prophylaxis (PrEP). You are considered at risk if:  You are a man who has sex with other men (MSM).  You are a heterosexual man who is sexually active with multiple partners.  You take drugs by injection.  You are sexually active with a partner who has HIV.  Talk with your health care provider about whether you are at  high risk of being infected with HIV. If you choose to begin PrEP, you should first be tested for HIV. You should then be tested every 3 months for as long as you are taking PrEP.  Use sunscreen. Apply sunscreen liberally and repeatedly throughout the day. You should seek shade when your shadow is shorter than you. Protect yourself by wearing long sleeves, pants, a wide-brimmed hat, and sunglasses year round whenever you are outdoors.  Tell your health care provider of new moles or changes in moles, especially if there is a change in shape or color. Also, tell your health care provider if a mole is larger than the size of a pencil eraser.  A one-time screening for abdominal aortic aneurysm (AAA) and surgical repair of large AAAs by ultrasound is recommended for men aged 27-75 years who are current or former smokers.  Stay current with your vaccines (immunizations).   This information is not intended to replace advice given to you by your health care provider. Make sure you discuss any questions you have with your health care provider.   Document Released: 05/31/2008 Document Revised: 12/24/2014 Document Reviewed: 04/30/2011 Elsevier Interactive Patient Education [employer].

## 2016-05-15 NOTE — Assessment & Plan Note (Signed)
States he has had severe myalgias with crestor recently and is not taking. He is taking fenofibrate and checking lipid panel. Adjust as needed.

## 2016-05-15 NOTE — Assessment & Plan Note (Signed)
Taking prozac for anger management and doing well without side effects.

## 2016-05-15 NOTE — Assessment & Plan Note (Signed)
Checking labs, tetanus up to date. Encouraged to start an exercise program but he declines. Weight is above where it should be. Given screening recommendations.

## 2016-05-21 ENCOUNTER — Encounter: Payer: Self-pay | Admitting: Internal Medicine

## 2016-05-21 ENCOUNTER — Other Ambulatory Visit: Payer: Self-pay | Admitting: Internal Medicine

## 2016-05-21 MED ORDER — OMEGA-3-ACID ETHYL ESTERS 1 G PO CAPS: 2.0000 g | ORAL_CAPSULE | Freq: Two times a day (BID) | ORAL | Status: DC

## 2016-10-29 ENCOUNTER — Telehealth: Payer: Self-pay

## 2016-10-29 MED ORDER — SUMATRIPTAN SUCCINATE 100 MG PO TABS: 50.0000 mg | ORAL_TABLET | ORAL | 0 refills | Status: DC | PRN

## 2016-10-29 NOTE — Addendum Note (Signed)
Addended by: Hillard DankerRAWFORD, ELIZABETH A on: 10/29/2016 02:28 PM   Modules accepted: Orders

## 2016-10-29 NOTE — Telephone Encounter (Signed)
Medication sent in. 

## 2016-10-29 NOTE — Telephone Encounter (Signed)
Please advise patient is requesting refill on sumatriptan

## 2016-11-16 ENCOUNTER — Encounter: Payer: Self-pay | Admitting: Internal Medicine

## 2016-11-16 DIAGNOSIS — E785 Hyperlipidemia, unspecified: Secondary | ICD-10-CM

## 2016-11-19 ENCOUNTER — Encounter: Payer: Self-pay | Admitting: Internal Medicine

## 2016-11-19 ENCOUNTER — Other Ambulatory Visit: Payer: Self-pay | Admitting: Internal Medicine

## 2016-11-19 MED ORDER — SUMATRIPTAN SUCCINATE 100 MG PO TABS: 50.0000 mg | ORAL_TABLET | ORAL | 0 refills | Status: DC | PRN

## 2016-11-20 ENCOUNTER — Other Ambulatory Visit (INDEPENDENT_AMBULATORY_CARE_PROVIDER_SITE_OTHER): Payer: 59

## 2016-11-20 DIAGNOSIS — R7989 Other specified abnormal findings of blood chemistry: Secondary | ICD-10-CM | POA: Diagnosis not present

## 2016-11-20 DIAGNOSIS — E785 Hyperlipidemia, unspecified: Secondary | ICD-10-CM | POA: Diagnosis not present

## 2016-11-20 LAB — HEMOGLOBIN A1C: HEMOGLOBIN A1C: 6.1 % (ref 4.6–6.5)

## 2016-11-20 LAB — LIPID PANEL
CHOLESTEROL: 258 mg/dL — AB (ref 0–200)
HDL: 39.6 mg/dL (ref >39.00)
NonHDL: 218.53
Total CHOL/HDL Ratio: 7
Triglycerides: 290 mg/dL — ABNORMAL HIGH (ref 0.0–149.0)
VLDL: 58 mg/dL — AB (ref 0.0–40.0)

## 2016-11-20 LAB — LDL CHOLESTEROL, DIRECT: Direct LDL: 145 mg/dL

## 2016-12-08 ENCOUNTER — Ambulatory Visit (INDEPENDENT_AMBULATORY_CARE_PROVIDER_SITE_OTHER): Payer: 59 | Admitting: Family Medicine

## 2016-12-08 ENCOUNTER — Encounter: Payer: Self-pay | Admitting: Family Medicine

## 2016-12-08 ENCOUNTER — Ambulatory Visit: Payer: 59 | Admitting: Family Medicine

## 2016-12-08 VITALS — BP 126/74 | HR 71 | Temp 98.4°F | Wt 214.0 lb

## 2016-12-08 DIAGNOSIS — L03032 Cellulitis of left toe: Secondary | ICD-10-CM | POA: Diagnosis not present

## 2016-12-08 MED ORDER — CEPHALEXIN 500 MG PO CAPS: 500.0000 mg | ORAL_CAPSULE | Freq: Three times a day (TID) | ORAL | 0 refills | Status: DC

## 2016-12-08 NOTE — Progress Notes (Signed)
Subjective:  Jason Phillips is a 35 y.o. year old very pleasant male patient who presents for/with See problem oriented charting ROS- no fever, chills, nausea, vomiting. Pain with walking noted   Past Medical History-  Patient Active Problem List   Diagnosis Date Noted  . Routine general medical examination at a health care facility 05/15/2016  . Allergic rhinitis 03/11/2014  . Abnormal LFTs (liver function tests)   . Lactose intolerance   . HEARING LOSS, BILATERAL 05/29/2010  . Hyperlipidemia 09/01/2009  . Episodic mood disorder (HCC) 09/01/2009  . GERD 09/01/2009    Medications- reviewed and updated Current Outpatient Prescriptions  Medication Sig Dispense Refill  . Diclofenac Sodium 2 % SOLN Apply 1 pump twice daily 112 g 3  . diphenhydrAMINE (BENADRYL) 25 mg capsule Take 25 mg by mouth at bedtime.     . famotidine (PEPCID AC) 10 MG chewable tablet Chew 20 mg by mouth at bedtime.     . fenofibrate 160 MG tablet Take 1 tablet (160 mg total) by mouth daily. 90 tablet 3  . FLUoxetine (PROZAC) 20 MG capsule Take 2 capsules (40 mg total) by mouth daily. 180 capsule 3  . ibuprofen (ADVIL,MOTRIN) 800 MG tablet Take 800 mg by mouth every 8 (eight) hours as needed for moderate pain.    Marland Kitchen. omega-3 acid ethyl esters (LOVAZA) 1 g capsule Take 2 capsules (2 g total) by mouth 2 (two) times daily. 120 capsule 6  . rosuvastatin (CRESTOR) 10 MG tablet Take 1 tablet (10 mg total) by mouth daily. (Patient not taking: Reported on 05/15/2016) 90 tablet 3  . SUMAtriptan (IMITREX) 100 MG tablet Take 0.5-1 tablets (50-100 mg total) by mouth every 2 (two) hours as needed for migraine. 10 tablet 0   Objective: BP 126/74   Pulse 71   Temp 98.4 F (36.9 C) (Oral)   Wt 214 lb (97.1 kg)   SpO2 98%   BMI 33.02 kg/m  Gen: NAD, resting comfortably CV: RRR no murmurs rubs or gallops Lungs: CTAB no crackles, wheeze, rhonchi  Ext: no edema Skin: warm, dry, on left great toe on medial aspect- erythema  and warmth noted- just next to nail there is a fluctuant area with white discoloration  Assessment/Plan:  Paronychia  S:  Left great toe pain since Monday. Symptoms were getting worse but slightly better today. Describes moderate aching worse with walking. Ibuprofen and tylenol help some. Has had this in the past but eventually goes away on its own. Poked it with a needle with alcohol with only blood drainage and didn't help much a few days ago A/P: We did a simple I+D after prepping with betadine. Purulence expressed. Immediate relief of symptoms. No anesthetic used but no pain experienced. Warm soaks advised. Also gave rx for keflex in case worsening symptoms as over holiday weekend but explained I+D likely sufficient.   Meds ordered this encounter  . cephALEXin (KEFLEX) 500 MG capsule    Sig: Take 1 capsule (500 mg total) by mouth 3 (three) times daily.    Dispense:  15 capsule    Refill:  0    Return precautions advised.  Tana ConchStephen Hunter, MD

## 2016-12-08 NOTE — Patient Instructions (Signed)
Warm soaks 3x a day for 15 minutes  Rx for keflex given holiday weekend but doubt you will need this- fill this only if worsening redness, pain doesn't continue to slowly improve
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
## 2016-12-21 ENCOUNTER — Encounter: Payer: Self-pay | Admitting: Family Medicine

## 2016-12-21 ENCOUNTER — Ambulatory Visit (INDEPENDENT_AMBULATORY_CARE_PROVIDER_SITE_OTHER): Payer: 59 | Admitting: Family Medicine

## 2016-12-21 DIAGNOSIS — M533 Sacrococcygeal disorders, not elsewhere classified: Secondary | ICD-10-CM | POA: Diagnosis not present

## 2016-12-21 DIAGNOSIS — M999 Biomechanical lesion, unspecified: Secondary | ICD-10-CM | POA: Diagnosis not present

## 2016-12-21 MED ORDER — GABAPENTIN 100 MG PO CAPS: 200.0000 mg | ORAL_CAPSULE | Freq: Every day | ORAL | 3 refills | Status: DC

## 2016-12-21 NOTE — Assessment & Plan Note (Signed)
Patient doesn't more of a sacroiliac dysfunction. Responded very well to osteopathic manipulation. Work with Event organiserathletic trainer to learn home exercises in greater detail. Patient is going to try the conservative therapy. Started gabapentin at night for some mild radicular symptoms he is having. Worsening symptoms we'll consider x-ray. We discussed icing regimen. Patient will come back and see me again in 4 weeks.

## 2016-12-21 NOTE — Patient Instructions (Signed)
Good to see you  Gabapentin 200mg  at night Ice 20 minutes 2 times daily. Usually after activity and before bed. Exercises 3 times a week.  Lift with your legs as much as you can  See me again in 4 weeks.  Happy New Year!

## 2016-12-21 NOTE — Assessment & Plan Note (Signed)
Decision today to treat with OMT was based on Physical Exam  After verbal consent patient was treated with HVLA, ME techniques in thoracic, lumbar, sacral areas  Patient tolerated the procedure well with improvement in symptoms  Patient given exercises, stretches and lifestyle modifications  See medications in patient instructions if given  Patient will follow up in 4 weeks      

## 2017-01-18 ENCOUNTER — Encounter: Payer: Self-pay | Admitting: Family Medicine

## 2017-01-18 ENCOUNTER — Ambulatory Visit (INDEPENDENT_AMBULATORY_CARE_PROVIDER_SITE_OTHER): Payer: 59 | Admitting: Internal Medicine

## 2017-01-18 ENCOUNTER — Ambulatory Visit (INDEPENDENT_AMBULATORY_CARE_PROVIDER_SITE_OTHER): Payer: 59 | Admitting: Family Medicine

## 2017-01-18 VITALS — BP 128/80 | HR 62 | Ht 67.5 in | Wt 215.0 lb

## 2017-01-18 DIAGNOSIS — M533 Sacrococcygeal disorders, not elsewhere classified: Secondary | ICD-10-CM | POA: Diagnosis not present

## 2017-01-18 DIAGNOSIS — M999 Biomechanical lesion, unspecified: Secondary | ICD-10-CM

## 2017-01-18 NOTE — Progress Notes (Signed)
Pre visit review using our clinic review tool, if applicable. No additional management support is needed unless otherwise documented below in the visit note. 
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
## 2017-01-18 NOTE — Assessment & Plan Note (Signed)
Mild worsening symptoms. No significant radicular symptoms. Seems to be more intermittent still. Had responded very well to osteopathic manipulation previously. We will continue with conservative therapy. Follow-up again with me in 4-6 weeks.

## 2017-01-18 NOTE — Patient Instructions (Addendum)
God to se eyou  Sorry for the confusion upfront.  Keep it up  COntinue to work on the posture and the home exercises.  pennsaid pinkie amount topically 2 times daily as needed.  I think we can space out to 3-6 weeks.

## 2017-01-18 NOTE — Assessment & Plan Note (Signed)
Decision today to treat with OMT was based on Physical Exam  After verbal consent patient was treated with HVLA, ME, FPR techniques in cervical, thoracic, lumbar and sacral areas  Patient tolerated the procedure well with improvement in symptoms  Patient given exercises, stretches and lifestyle modifications  See medications in patient instructions if given  Patient will follow up in 4-6 weeks 

## 2017-01-21 ENCOUNTER — Ambulatory Visit (INDEPENDENT_AMBULATORY_CARE_PROVIDER_SITE_OTHER): Payer: 59 | Admitting: Internal Medicine

## 2017-01-21 ENCOUNTER — Ambulatory Visit: Payer: 59 | Admitting: Internal Medicine

## 2017-01-21 ENCOUNTER — Encounter: Payer: Self-pay | Admitting: Internal Medicine

## 2017-01-21 DIAGNOSIS — R69 Illness, unspecified: Principal | ICD-10-CM

## 2017-01-21 DIAGNOSIS — R509 Fever, unspecified: Secondary | ICD-10-CM

## 2017-01-21 DIAGNOSIS — R05 Cough: Secondary | ICD-10-CM

## 2017-01-21 DIAGNOSIS — J029 Acute pharyngitis, unspecified: Secondary | ICD-10-CM

## 2017-01-21 DIAGNOSIS — J3489 Other specified disorders of nose and nasal sinuses: Secondary | ICD-10-CM | POA: Diagnosis not present

## 2017-01-21 DIAGNOSIS — J111 Influenza due to unidentified influenza virus with other respiratory manifestations: Secondary | ICD-10-CM | POA: Insufficient documentation

## 2017-01-21 MED ORDER — LEVOCETIRIZINE DIHYDROCHLORIDE 5 MG PO TABS: 5.0000 mg | ORAL_TABLET | Freq: Every evening | ORAL | 1 refills | Status: DC

## 2017-01-21 MED ORDER — SUMATRIPTAN SUCCINATE 100 MG PO TABS: 50.0000 mg | ORAL_TABLET | ORAL | 3 refills | Status: DC | PRN

## 2017-01-21 NOTE — Patient Instructions (Signed)
We have sent in the migraine refill to take for the headaches.   We have also sent in some xyzal (or you can take zyrtec over the counter) for the congestion to help.

## 2017-01-21 NOTE — Progress Notes (Signed)
   Subjective:    Patient ID: Jason Phillips, male    DOB: 09-09-1981, 36 y.o.   MRN: [id number]  HPI The patient is a 36 YO man coming in for sinus congestion. He has had these symptoms for about 1 week. 3-4 days ago he got some sweats and body aches. He has been exposed to flu positive persons. He is taking sudafed and some tylenol and ibuprofen for headaches. He denies fevers but having chills. He is having cough at the beginning and this is improving. Denies SOB. Overall his cough is improving and not much anymore. He is still having sinus congestion and headaches. These are about the same.   Review of Systems  Constitutional: Positive for activity change, chills and fever. Negative for appetite change, fatigue and unexpected weight change.  HENT: Positive for congestion, postnasal drip, rhinorrhea, sinus pain and sinus pressure. Negative for ear discharge, ear pain, sore throat and trouble swallowing.   Eyes: Negative.   Respiratory: Positive for cough. Negative for chest tightness, shortness of breath and wheezing.        Minimal  Cardiovascular: Negative.   Gastrointestinal: Negative.   Musculoskeletal: Positive for myalgias. Negative for arthralgias and back pain.  Neurological: Positive for headaches.      Objective:   Physical Exam  Constitutional: He appears well-developed and well-nourished.  HENT:  Head: Normocephalic and atraumatic.  Oropharynx with redness and clear drainage. Frontal sinus pressure.   Eyes: EOM are normal.  Neck: Normal range of motion.  Cardiovascular: Normal rate and regular rhythm.   Pulmonary/Chest: Effort normal and breath sounds normal. No respiratory distress. He has no wheezes. He has no rales.  Abdominal: Soft.  Lymphadenopathy:    He has no cervical adenopathy.  Skin: Skin is warm and dry.   Vitals:   01/21/17 1437  BP: 132/82  Pulse: 67  Temp: 98 F (36.7 C)  TempSrc: Oral  SpO2: 100%  Weight: 208 lb (94.3 kg)  Height: 5' 7.5"  (1.715 m)      Assessment & Plan:

## 2017-01-21 NOTE — Assessment & Plan Note (Signed)
Too late in course for tamiflu, no indication for antibiotics or steroids today. Rx for xyzal and for sumatriptan for his headaches.

## 2017-01-21 NOTE — Progress Notes (Signed)
Pre visit review using our clinic review tool, if applicable. No additional management support is needed unless otherwise documented below in the visit note. 

## 2017-01-29 ENCOUNTER — Telehealth: Payer: Self-pay

## 2017-01-29 ENCOUNTER — Other Ambulatory Visit: Payer: Self-pay | Admitting: Internal Medicine

## 2017-01-29 MED ORDER — OMEGA-3-ACID ETHYL ESTERS 1 G PO CAPS: 2.0000 g | ORAL_CAPSULE | Freq: Two times a day (BID) | ORAL | 6 refills | Status: DC

## 2017-01-29 NOTE — Telephone Encounter (Signed)
Medication refill

## 2017-04-22 ENCOUNTER — Encounter: Payer: Self-pay | Admitting: Internal Medicine

## 2017-04-22 ENCOUNTER — Ambulatory Visit (INDEPENDENT_AMBULATORY_CARE_PROVIDER_SITE_OTHER): Payer: 59 | Admitting: Internal Medicine

## 2017-04-22 DIAGNOSIS — S70372A Other superficial bite of left thigh, initial encounter: Secondary | ICD-10-CM

## 2017-04-22 DIAGNOSIS — W57XXXA Bitten or stung by nonvenomous insect and other nonvenomous arthropods, initial encounter: Secondary | ICD-10-CM | POA: Insufficient documentation

## 2017-04-22 NOTE — Progress Notes (Signed)
   Subjective:    Patient ID: Jason Phillips, male    DOB: 1981-01-13, 36 y.o.   MRN: [id number]  HPI The patient is a 36 YO man coming in for tick bite over the weekend. They removed the entire tick and it was on less than 24 hours. 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 Denies rash to the area initially. Has been itching and he is scratching at it. No bullseye rash that he has seen. Denies fevers or chills. No headaches or neurological changes.   Review of Systems  Constitutional: Negative.   Respiratory: Negative.   Cardiovascular: Negative.   Gastrointestinal: Negative.   Musculoskeletal: Negative.   Skin: Positive for rash.       itching  Neurological: Negative.       Objective:   Physical Exam  Constitutional: He is oriented to person, place, and time. He appears well-developed and well-nourished. No distress.  HENT:  Head: Normocephalic and atraumatic.  Cardiovascular: Normal rate and regular rhythm.   Pulmonary/Chest: Effort normal.  Abdominal: Soft.  Musculoskeletal: He exhibits no edema.  Neurological: He is alert and oriented to person, place, and time. Coordination normal.  Skin: Skin is warm and dry.  Small bite left thigh with some stigmata of scratching. No bullseye rash or clearing.    Vitals:   04/22/17 1340  BP: 130/78  Pulse: 72  Resp: 12  Temp: 98.7 F (37.1 C)  TempSrc: Oral  SpO2: 99%  Weight: 203 lb (92.1 kg)  Height: 5' 7.5" (1.715 m)      Assessment & Plan:

## 2017-04-22 NOTE — Patient Instructions (Signed)
You can use benadryl cream or cortisone cream on the area.   In the future watch out for rash that looks like a bullseye.   If you get fevers or chills call us back.

## 2017-04-22 NOTE — Progress Notes (Signed)
Pre visit review using our clinic review tool, if applicable. No additional management support is needed unless otherwise documented below in the visit note. 

## 2017-04-22 NOTE — Assessment & Plan Note (Signed)
We talked about how this area is not endemic to lyme disease and he does not need treatment. No signs of RMSF so no titers drawn. Offered cream for the itching but he will try benadryl otc or cortisone first.

## 2017-05-03 ENCOUNTER — Ambulatory Visit (INDEPENDENT_AMBULATORY_CARE_PROVIDER_SITE_OTHER): Payer: 59 | Admitting: Family Medicine

## 2017-05-03 ENCOUNTER — Encounter: Payer: Self-pay | Admitting: Family Medicine

## 2017-05-03 VITALS — BP 128/80 | HR 64 | Ht 67.5 in | Wt 201.0 lb

## 2017-05-03 DIAGNOSIS — M533 Sacrococcygeal disorders, not elsewhere classified: Secondary | ICD-10-CM

## 2017-05-03 DIAGNOSIS — M999 Biomechanical lesion, unspecified: Secondary | ICD-10-CM

## 2017-05-03 MED ORDER — MELOXICAM 15 MG PO TABS: 15.0000 mg | ORAL_TABLET | Freq: Every day | ORAL | 0 refills | Status: DC

## 2017-05-03 MED ORDER — GABAPENTIN 100 MG PO CAPS: 200.0000 mg | ORAL_CAPSULE | Freq: Every day | ORAL | 3 refills | Status: DC

## 2017-05-03 NOTE — Assessment & Plan Note (Signed)
Decision today to treat with OMT was based on Physical Exam  After verbal consent patient was treated with HVLA, ME, FPR techniques in cervical, thoracic, lumbar and sacral areas  Patient tolerated the procedure well with improvement in symptoms  Patient given exercises, stretches and lifestyle modifications  See medications in patient instructions if given  Patient will follow up in 2-4 weeks 

## 2017-05-03 NOTE — Patient Instructions (Signed)
Good to see you  Meloxicam daily for 10 days then as needed.  Gabapentin nightly for 3 nights then as needed My exercises 2-3 times a week.  Ice is your friend.  See me again in 2 weeks if not better otherwise every 2 months

## 2017-05-03 NOTE — Assessment & Plan Note (Signed)
Discussed with patient about home exercise, icing regimen, we discussed the different medication changes. Encourage him to take gabapentin on a regular routine. Patient responded very well to osteopathic manipulation and had some decrease in pain immediately. Follow-up again in 2-4 weeks to make sure patient is responding. If any radicular symptoms patient will call sooner.
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
## 2017-05-08 ENCOUNTER — Encounter: Payer: Self-pay | Admitting: Family Medicine

## 2017-05-08 DIAGNOSIS — M5416 Radiculopathy, lumbar region: Secondary | ICD-10-CM

## 2017-05-08 MED ORDER — PREDNISONE 50 MG PO TABS: ORAL_TABLET | ORAL | 0 refills | Status: DC

## 2017-05-20 ENCOUNTER — Other Ambulatory Visit: Payer: Self-pay | Admitting: Internal Medicine

## 2017-05-23 ENCOUNTER — Ambulatory Visit
Admission: RE | Admit: 2017-05-23 | Discharge: 2017-05-23 | Disposition: A | Discharge status: Home / Self Care | Payer: 59 | Source: Ambulatory Visit | Attending: Family Medicine | Admitting: Family Medicine

## 2017-05-23 DIAGNOSIS — M5126 Other intervertebral disc displacement, lumbar region: Secondary | ICD-10-CM | POA: Diagnosis not present

## 2017-05-23 DIAGNOSIS — M5416 Radiculopathy, lumbar region: Secondary | ICD-10-CM

## 2017-05-30 ENCOUNTER — Ambulatory Visit: Payer: 59 | Admitting: Family Medicine

## 2017-05-31 ENCOUNTER — Ambulatory Visit: Payer: 59 | Admitting: Family Medicine

## 2017-06-06 DIAGNOSIS — E782 Mixed hyperlipidemia: Secondary | ICD-10-CM | POA: Diagnosis not present

## 2017-06-06 DIAGNOSIS — F39 Unspecified mood [affective] disorder: Secondary | ICD-10-CM | POA: Diagnosis not present

## 2017-06-06 DIAGNOSIS — M545 Low back pain: Secondary | ICD-10-CM | POA: Diagnosis not present

## 2017-06-06 DIAGNOSIS — R7303 Prediabetes: Secondary | ICD-10-CM | POA: Diagnosis not present

## 2017-06-06 DIAGNOSIS — H905 Unspecified sensorineural hearing loss: Secondary | ICD-10-CM | POA: Diagnosis not present

## 2017-06-06 DIAGNOSIS — R1030 Lower abdominal pain, unspecified: Secondary | ICD-10-CM | POA: Diagnosis not present

## 2017-06-24 ENCOUNTER — Ambulatory Visit (HOSPITAL_COMMUNITY)
Admission: RE | Admit: 2017-06-24 | Discharge: 2017-06-24 | Disposition: A | Discharge status: Home / Self Care | Payer: 59 | Source: Ambulatory Visit | Attending: Family Medicine | Admitting: Family Medicine

## 2017-06-24 ENCOUNTER — Other Ambulatory Visit (HOSPITAL_COMMUNITY): Payer: Self-pay | Admitting: Family Medicine

## 2017-06-24 DIAGNOSIS — N281 Cyst of kidney, acquired: Secondary | ICD-10-CM | POA: Diagnosis not present

## 2017-06-24 DIAGNOSIS — R1031 Right lower quadrant pain: Secondary | ICD-10-CM | POA: Insufficient documentation

## 2017-06-24 DIAGNOSIS — R1011 Right upper quadrant pain: Secondary | ICD-10-CM | POA: Diagnosis not present

## 2017-06-24 MED ORDER — IOPAMIDOL (ISOVUE-300) INJECTION 61%
INTRAVENOUS | Status: AC
  Filled 2017-06-24: qty 30

## 2017-06-24 MED ORDER — IOPAMIDOL (ISOVUE-300) INJECTION 61%
INTRAVENOUS | Status: AC
Start: 2017-06-24 — End: 2017-06-24
  Administered 2017-06-24: 100 mL
  Filled 2017-06-24: qty 100

## 2017-09-11 ENCOUNTER — Other Ambulatory Visit: Payer: Self-pay | Admitting: Internal Medicine

## 2017-10-14 ENCOUNTER — Other Ambulatory Visit: Payer: Self-pay | Admitting: Internal Medicine

## 2017-12-11 ENCOUNTER — Encounter (HOSPITAL_COMMUNITY): Payer: Self-pay | Admitting: *Deleted

## 2017-12-11 ENCOUNTER — Emergency Department (HOSPITAL_COMMUNITY)
Admission: EM | Admit: 2017-12-11 | Discharge: 2017-12-11 | Disposition: A | Discharge status: Home / Self Care | Payer: 59 | Attending: Emergency Medicine | Admitting: Emergency Medicine

## 2017-12-11 ENCOUNTER — Emergency Department (HOSPITAL_COMMUNITY): Payer: 59

## 2017-12-11 ENCOUNTER — Other Ambulatory Visit: Payer: Self-pay

## 2017-12-11 DIAGNOSIS — S161XXA Strain of muscle, fascia and tendon at neck level, initial encounter: Secondary | ICD-10-CM | POA: Insufficient documentation

## 2017-12-11 DIAGNOSIS — S3993XA Unspecified injury of pelvis, initial encounter: Secondary | ICD-10-CM | POA: Diagnosis not present

## 2017-12-11 DIAGNOSIS — R1032 Left lower quadrant pain: Secondary | ICD-10-CM | POA: Insufficient documentation

## 2017-12-11 DIAGNOSIS — S199XXA Unspecified injury of neck, initial encounter: Secondary | ICD-10-CM | POA: Diagnosis not present

## 2017-12-11 DIAGNOSIS — Y9389 Activity, other specified: Secondary | ICD-10-CM | POA: Insufficient documentation

## 2017-12-11 DIAGNOSIS — R0789 Other chest pain: Secondary | ICD-10-CM | POA: Diagnosis not present

## 2017-12-11 DIAGNOSIS — T148XXA Other injury of unspecified body region, initial encounter: Secondary | ICD-10-CM | POA: Diagnosis not present

## 2017-12-11 DIAGNOSIS — Y999 Unspecified external cause status: Secondary | ICD-10-CM | POA: Insufficient documentation

## 2017-12-11 DIAGNOSIS — S299XXA Unspecified injury of thorax, initial encounter: Secondary | ICD-10-CM | POA: Diagnosis not present

## 2017-12-11 DIAGNOSIS — M542 Cervicalgia: Secondary | ICD-10-CM | POA: Diagnosis not present

## 2017-12-11 DIAGNOSIS — Y929 Unspecified place or not applicable: Secondary | ICD-10-CM | POA: Diagnosis not present

## 2017-12-11 NOTE — Discharge Instructions (Signed)

## 2017-12-11 NOTE — ED Notes (Signed)
Patient given discharge instructions and verbalized understanding.  Patient stable to discharge at this time.  Patient is alert and oriented to baseline.  No distressed noted at this time.  All belongings taken with the patient at discharge.   
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
## 2017-12-11 NOTE — ED Notes (Signed)
Family at bedside. 
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
## 2017-12-26 DIAGNOSIS — R7303 Prediabetes: Secondary | ICD-10-CM | POA: Diagnosis not present

## 2017-12-26 DIAGNOSIS — F39 Unspecified mood [affective] disorder: Secondary | ICD-10-CM | POA: Diagnosis not present

## 2017-12-26 DIAGNOSIS — E782 Mixed hyperlipidemia: Secondary | ICD-10-CM | POA: Diagnosis not present

## 2018-01-28 DIAGNOSIS — J029 Acute pharyngitis, unspecified: Secondary | ICD-10-CM | POA: Diagnosis not present

## 2018-01-28 DIAGNOSIS — J069 Acute upper respiratory infection, unspecified: Secondary | ICD-10-CM | POA: Diagnosis not present

## 2018-01-30 ENCOUNTER — Encounter: Payer: Self-pay | Admitting: Internal Medicine

## 2018-01-30 ENCOUNTER — Ambulatory Visit: Payer: 59 | Admitting: Internal Medicine

## 2018-01-30 DIAGNOSIS — J40 Bronchitis, not specified as acute or chronic: Secondary | ICD-10-CM | POA: Diagnosis not present

## 2018-01-30 MED ORDER — DOXYCYCLINE HYCLATE 100 MG PO TABS: 100.0000 mg | ORAL_TABLET | Freq: Two times a day (BID) | ORAL | 0 refills | Status: DC

## 2018-01-30 MED ORDER — PROMETHAZINE-DM 6.25-15 MG/5ML PO SYRP: 5.0000 mL | ORAL_SOLUTION | Freq: Two times a day (BID) | ORAL | 0 refills | Status: DC | PRN

## 2018-01-30 NOTE — Progress Notes (Signed)
   Subjective:    Patient ID: Jason Phillips, male    DOB: 1981-07-01, 37 y.o.   MRN: [id number]  HPI The patient is a 37 YO man coming in for about 8 days of cold symptoms. Overall they feel like they are worsening. Some fevers and chills at home but not in the last 2 days. He is having sinus pressure and congestion with drainage. Some cough and SOB at night time. Some ear pain and headaches. Sore throat is gone now. Taking ibuprofen and cold medicine.   Review of Systems  Constitutional: Positive for activity change, appetite change and chills. Negative for fatigue, fever and unexpected weight change.  HENT: Positive for congestion, postnasal drip, rhinorrhea and sinus pressure. Negative for ear discharge, ear pain, sinus pain, sneezing, sore throat, tinnitus, trouble swallowing and voice change.   Eyes: Negative.   Respiratory: Positive for cough and shortness of breath. Negative for chest tightness and wheezing.   Cardiovascular: Negative.   Gastrointestinal: Negative.   Musculoskeletal: Positive for myalgias.  Neurological: Negative.       Objective:   Physical Exam  Constitutional: He is oriented to person, place, and time. He appears well-developed and well-nourished.  HENT:  Head: Normocephalic and atraumatic.  Oropharynx with redness and clear drainage, nose with swollen turbinates, TMs normal bilaterally  Eyes: EOM are normal.  Neck: Normal range of motion. No thyromegaly present.  Cardiovascular: Normal rate and regular rhythm.  Pulmonary/Chest: Effort normal and breath sounds normal. No respiratory distress. He has no wheezes. He has no rales.  Mild coarse rhonchi which partially clear with coughing  Abdominal: Soft.  Musculoskeletal: He exhibits tenderness.  Lymphadenopathy:    He has no cervical adenopathy.  Neurological: He is alert and oriented to person, place, and time.  Skin: Skin is warm and dry.   Vitals:   01/30/18 1550  BP: 130/82  Pulse: 71  Temp:  97.6 F (36.4 C)  TempSrc: Oral  SpO2: 97%  Weight: 204 lb (92.5 kg)  Height: 5' 7.5" (1.715 m)      Assessment & Plan:

## 2018-01-30 NOTE — Patient Instructions (Addendum)
We have sent in the doxycycline to take 1 pill twice a day.  We have sent in the promethazine cough syrup.

## 2018-01-31 DIAGNOSIS — J40 Bronchitis, not specified as acute or chronic: Secondary | ICD-10-CM | POA: Insufficient documentation

## 2018-01-31 NOTE — Assessment & Plan Note (Signed)
Rx for doxycycline and promethazine cough syrup given the extended course and worsening.

## 2018-02-17 ENCOUNTER — Ambulatory Visit: Payer: 59 | Admitting: Internal Medicine

## 2018-02-17 ENCOUNTER — Encounter: Payer: Self-pay | Admitting: Internal Medicine

## 2018-02-17 DIAGNOSIS — J40 Bronchitis, not specified as acute or chronic: Secondary | ICD-10-CM | POA: Diagnosis not present

## 2018-02-17 MED ORDER — PREDNISONE 20 MG PO TABS: 40.0000 mg | ORAL_TABLET | Freq: Every day | ORAL | 0 refills | Status: DC

## 2018-02-17 MED ORDER — BENZONATATE 200 MG PO CAPS: 200.0000 mg | ORAL_CAPSULE | Freq: Two times a day (BID) | ORAL | 0 refills | Status: DC | PRN

## 2018-02-17 NOTE — Assessment & Plan Note (Signed)
Rx for tessalon perles and prednisone burst. Will continue zyrtec daily.

## 2018-02-17 NOTE — Progress Notes (Signed)
   Subjective:    Patient ID: Jason Phillips, male    DOB: 10/11/1981, 37 y.o.   MRN: [id number]  HPI The patient is a 37 YO man coming in for sore throat and cough. Coughing up yellow sputum. He did doxycycline about 3 weeks ago and this fully resolved his symptoms and he was feeling well for about 1 week. He denies fevers or chills with this current episode. Some congestion. Is taking zyrtec which is not helping. He is not sleeping well due to cough. He has not tried the cough syrup which he was prescribed last time. Denies ear pain or headaches. No muscle aches. Overall worsening.   Review of Systems  Constitutional: Positive for activity change and appetite change. Negative for chills, fatigue, fever and unexpected weight change.  HENT: Positive for congestion, postnasal drip and rhinorrhea. Negative for ear discharge, ear pain, sinus pressure, sinus pain, sneezing, sore throat, tinnitus, trouble swallowing and voice change.   Eyes: Negative.   Respiratory: Positive for cough. Negative for chest tightness, shortness of breath and wheezing.   Cardiovascular: Negative.   Gastrointestinal: Negative.   Neurological: Negative.       Objective:   Physical Exam  Constitutional: He is oriented to person, place, and time. He appears well-developed and well-nourished.  HENT:  Head: Normocephalic and atraumatic.  Oropharynx with redness and clear drainage, nose with swollen turbinates, TMs normal bilaterally  Eyes: EOM are normal.  Neck: Normal range of motion. No thyromegaly present.  Cardiovascular: Normal rate and regular rhythm.  Pulmonary/Chest: Effort normal and breath sounds normal. No respiratory distress. He has no wheezes. He has no rales.  Abdominal: Soft.  Lymphadenopathy:    He has no cervical adenopathy.  Neurological: He is alert and oriented to person, place, and time.  Skin: Skin is warm and dry.   Vitals:   02/17/18 1556  BP: 120/70  Pulse: 87  Temp: 98.8 F (37.1  C)  TempSrc: Oral  SpO2: 97%  Weight: 208 lb (94.3 kg)  Height: 5' 7.5" (1.715 m)      Assessment & Plan:

## 2018-02-17 NOTE — Patient Instructions (Signed)
We have sent in the prednisone to take to dry up the cough. Take 2 pills daily for 5 days to help the drainage.

## 2018-03-02 ENCOUNTER — Encounter: Payer: Self-pay | Admitting: Internal Medicine

## 2018-03-02 DIAGNOSIS — E785 Hyperlipidemia, unspecified: Secondary | ICD-10-CM

## 2018-03-04 ENCOUNTER — Other Ambulatory Visit (INDEPENDENT_AMBULATORY_CARE_PROVIDER_SITE_OTHER): Payer: 59

## 2018-03-04 DIAGNOSIS — E785 Hyperlipidemia, unspecified: Secondary | ICD-10-CM | POA: Diagnosis not present

## 2018-03-04 LAB — COMPREHENSIVE METABOLIC PANEL
ALBUMIN: 4.4 g/dL (ref 3.5–5.2)
ALT: 54 U/L — ABNORMAL HIGH (ref 0–53)
AST: 33 U/L (ref 0–37)
Alkaline Phosphatase: 51 U/L (ref 39–117)
BUN: 12 mg/dL (ref 6–23)
CHLORIDE: 102 meq/L (ref 96–112)
CO2: 27 mEq/L (ref 19–32)
CREATININE: 0.66 mg/dL (ref 0.40–1.50)
Calcium: 10 mg/dL (ref 8.4–10.5)
GFR: 143.11 mL/min (ref >60.00)
GLUCOSE: 112 mg/dL — AB (ref 70–99)
POTASSIUM: 4 meq/L (ref 3.5–5.1)
SODIUM: 138 meq/L (ref 135–145)
Total Bilirubin: 0.8 mg/dL (ref 0.2–1.2)
Total Protein: 7.3 g/dL (ref 6.0–8.3)

## 2018-03-04 LAB — LIPID PANEL
CHOL/HDL RATIO: 5
CHOLESTEROL: 205 mg/dL — AB (ref 0–200)
HDL: 42 mg/dL (ref >39.00)
Triglycerides: 425 mg/dL — ABNORMAL HIGH (ref 0.0–149.0)

## 2018-03-04 LAB — LDL CHOLESTEROL, DIRECT: LDL DIRECT: 90 mg/dL

## 2018-03-05 ENCOUNTER — Other Ambulatory Visit: Payer: Self-pay | Admitting: Internal Medicine

## 2018-03-05 MED ORDER — PITAVASTATIN CALCIUM 2 MG PO TABS: 2.0000 mg | ORAL_TABLET | Freq: Every day | ORAL | 3 refills | Status: DC

## 2018-03-10 MED ORDER — FENOFIBRATE 145 MG PO TABS: 145.0000 mg | ORAL_TABLET | Freq: Every day | ORAL | 1 refills | Status: DC

## 2018-03-10 NOTE — Addendum Note (Signed)
Addended by: Hillard DankerRAWFORD, ELIZABETH A on: 03/10/2018 01:53 PM   Modules accepted: Orders

## 2018-05-05 ENCOUNTER — Ambulatory Visit (INDEPENDENT_AMBULATORY_CARE_PROVIDER_SITE_OTHER): Payer: 59 | Admitting: Internal Medicine

## 2018-05-05 ENCOUNTER — Encounter: Payer: Self-pay | Admitting: Internal Medicine

## 2018-05-05 ENCOUNTER — Ambulatory Visit (INDEPENDENT_AMBULATORY_CARE_PROVIDER_SITE_OTHER)
Admission: RE | Admit: 2018-05-05 | Discharge: 2018-05-05 | Disposition: A | Discharge status: Home / Self Care | Payer: 59 | Source: Ambulatory Visit | Attending: Internal Medicine | Admitting: Internal Medicine

## 2018-05-05 ENCOUNTER — Other Ambulatory Visit (INDEPENDENT_AMBULATORY_CARE_PROVIDER_SITE_OTHER): Payer: 59

## 2018-05-05 VITALS — BP 120/78 | HR 67 | Temp 98.4°F | Ht 67.5 in | Wt 207.0 lb

## 2018-05-05 DIAGNOSIS — Z23 Encounter for immunization: Secondary | ICD-10-CM

## 2018-05-05 DIAGNOSIS — Z0000 Encounter for general adult medical examination without abnormal findings: Secondary | ICD-10-CM

## 2018-05-05 DIAGNOSIS — E782 Mixed hyperlipidemia: Secondary | ICD-10-CM

## 2018-05-05 DIAGNOSIS — F39 Unspecified mood [affective] disorder: Secondary | ICD-10-CM | POA: Diagnosis not present

## 2018-05-05 DIAGNOSIS — M79672 Pain in left foot: Secondary | ICD-10-CM

## 2018-05-05 LAB — CBC
HCT: 39 % (ref 39.0–52.0)
Hemoglobin: 13.3 g/dL (ref 13.0–17.0)
MCHC: 34.2 g/dL (ref 30.0–36.0)
MCV: 87.7 fl (ref 78.0–100.0)
Platelets: 317 10*3/uL (ref 150.0–400.0)
RBC: 4.45 Mil/uL (ref 4.22–5.81)
RDW: 12.9 % (ref 11.5–15.5)
WBC: 7.2 10*3/uL (ref 4.0–10.5)

## 2018-05-05 LAB — LDL CHOLESTEROL, DIRECT: LDL DIRECT: 119 mg/dL

## 2018-05-05 LAB — COMPREHENSIVE METABOLIC PANEL
ALT: 39 U/L (ref 0–53)
AST: 28 U/L (ref 0–37)
Albumin: 4.3 g/dL (ref 3.5–5.2)
Alkaline Phosphatase: 35 U/L — ABNORMAL LOW (ref 39–117)
BUN: 11 mg/dL (ref 6–23)
CHLORIDE: 106 meq/L (ref 96–112)
CO2: 24 meq/L (ref 19–32)
Calcium: 9.4 mg/dL (ref 8.4–10.5)
Creatinine, Ser: 0.69 mg/dL (ref 0.40–1.50)
GFR: 135.83 mL/min (ref >60.00)
GLUCOSE: 108 mg/dL — AB (ref 70–99)
POTASSIUM: 3.8 meq/L (ref 3.5–5.1)
SODIUM: 140 meq/L (ref 135–145)
TOTAL PROTEIN: 7.4 g/dL (ref 6.0–8.3)
Total Bilirubin: 0.5 mg/dL (ref 0.2–1.2)

## 2018-05-05 LAB — LIPID PANEL
CHOL/HDL RATIO: 5
Cholesterol: 201 mg/dL — ABNORMAL HIGH (ref 0–200)
HDL: 41.3 mg/dL (ref >39.00)
NONHDL: 160.14
Triglycerides: 263 mg/dL — ABNORMAL HIGH (ref 0.0–149.0)
VLDL: 52.6 mg/dL — AB (ref 0.0–40.0)

## 2018-05-05 NOTE — Progress Notes (Signed)
   Subjective:    Patient ID: Jason Phillips, male    DOB: [birth year], 37 y.o.   MRN: [id number]  HPI The patient is a 37 YO man coming in for physical. No new concerns. Started fenofibrate about 2 months ago.   PMH, [facility], social history reviewed and updated.   Review of Systems  Constitutional: Negative.   HENT: Negative.   Eyes: Negative.   Respiratory: Negative for cough, chest tightness and shortness of breath.   Cardiovascular: Negative for chest pain, palpitations and leg swelling.  Gastrointestinal: Negative for abdominal distention, abdominal pain, constipation, diarrhea, nausea and vomiting.  Musculoskeletal: Positive for arthralgias.  Skin: Negative.   Neurological: Negative.   Psychiatric/Behavioral: Negative.       Objective:   Physical Exam  Constitutional: He is oriented to person, place, and time. He appears well-developed and well-nourished.  HENT:  Head: Normocephalic and atraumatic.  Eyes: EOM are normal.  Neck: Normal range of motion.  Cardiovascular: Normal rate and regular rhythm.  Pulmonary/Chest: Effort normal and breath sounds normal. No respiratory distress. He has no wheezes. He has no rales.  Abdominal: Soft. Bowel sounds are normal. He exhibits no distension. There is no tenderness. There is no rebound.  Musculoskeletal: He exhibits no edema.  Prominence over the mtp large toe left foot tender to touch  Neurological: He is alert and oriented to person, place, and time. Coordination normal.  Skin: Skin is warm and dry.  Psychiatric: He has a normal mood and affect.   Vitals:   05/05/18 0857  BP: 120/78  Pulse: 67  Temp: 98.4 F (36.9 C)  TempSrc: Oral  SpO2: 98%  Weight: 207 lb (93.9 kg)  Height: 5' 7.5" (1.715 m)      Assessment & Plan:  Tdap given at visit

## 2018-05-05 NOTE — Patient Instructions (Signed)

## 2018-05-05 NOTE — Assessment & Plan Note (Signed)
Checking lipid panel and adjust as needed. Fenofibrate and pitavasatin currently.

## 2018-05-05 NOTE — Assessment & Plan Note (Signed)
Tetanus given at visit. Flu shot yearly. Declines HIV screening. Counseled about hearing protection, sun safety and mole surveillance. Given screening recommendations.

## 2018-05-05 NOTE — Assessment & Plan Note (Signed)
Taking prozac 40 mg daily and controlled.

## 2018-05-19 ENCOUNTER — Encounter: Payer: Self-pay | Admitting: Internal Medicine

## 2018-06-05 ENCOUNTER — Encounter: Payer: Self-pay | Admitting: Internal Medicine

## 2018-06-21 DIAGNOSIS — H5213 Myopia, bilateral: Secondary | ICD-10-CM | POA: Diagnosis not present

## 2018-09-17 ENCOUNTER — Ambulatory Visit: Payer: 59 | Admitting: Internal Medicine

## 2018-09-17 ENCOUNTER — Encounter: Payer: Self-pay | Admitting: Internal Medicine

## 2018-09-17 VITALS — BP 110/80 | HR 74 | Temp 98.2°F | Ht 67.5 in | Wt 207.0 lb

## 2018-09-17 DIAGNOSIS — M7061 Trochanteric bursitis, right hip: Secondary | ICD-10-CM | POA: Diagnosis not present

## 2018-09-17 MED ORDER — CYCLOBENZAPRINE HCL 5 MG PO TABS: 5.0000 mg | ORAL_TABLET | Freq: Three times a day (TID) | ORAL | 0 refills | Status: DC | PRN

## 2018-09-17 NOTE — Progress Notes (Signed)
   Subjective:    Patient ID: Jason Phillips, male    DOB: 07-26-1981, 37 y.o.   MRN: [id number]  HPI The patient is a 37 YO man coming in for right testicular pain which started about 1 week or so ago. He was sitting on some hard chairs with pressure on his testicle. This did not get better with tylenol or ibuprofen. He has tried that a couple of times over the last week. He denies penile pain or discharge. No swelling of the testicles or scrotum. No rash or sore. Denies new partners. After a couple of days the pain moved more into the low right back and has stayed there and on the right hip. Denies injury or overuse. Denies numbness or weakness. Denies change in bowel or bladder. Denies pain radiating anywhere. Pain about 5/10 with no medicine and mild improvement only with medicine.   Review of Systems  Constitutional: Negative for activity change, appetite change, fatigue, fever and unexpected weight change.  Respiratory: Negative.   Cardiovascular: Negative.   Genitourinary: Positive for testicular pain. Negative for decreased urine volume, discharge, genital sores, penile pain, penile swelling, scrotal swelling and urgency.  Musculoskeletal: Positive for back pain and myalgias. Negative for arthralgias.  Skin: Negative.   Neurological: Negative for syncope, weakness and numbness.      Objective:   Physical Exam  Constitutional: He is oriented to person, place, and time. He appears well-developed and well-nourished.  HENT:  Head: Normocephalic and atraumatic.  Eyes: EOM are normal.  Neck: Normal range of motion.  Cardiovascular: Normal rate and regular rhythm.  Pulmonary/Chest: Effort normal and breath sounds normal. No respiratory distress. He has no wheezes. He has no rales.  Abdominal: Soft. Bowel sounds are normal. He exhibits no distension. There is no tenderness. There is no rebound.  Genitourinary:  Genitourinary Comments: Declined genital exam as no current symptoms there   Musculoskeletal: He exhibits tenderness. He exhibits no edema.  Pain in the SI right and right buttock region without radiation.   Neurological: He is alert and oriented to person, place, and time. Coordination normal.  Skin: Skin is warm and dry.  Psychiatric: He has a normal mood and affect.   Vitals:   09/17/18 0957  BP: 110/80  Pulse: 74  Temp: 98.2 F (36.8 C)  TempSrc: Oral  SpO2: 98%  Weight: 207 lb (93.9 kg)  Height: 5' 7.5" (1.715 m)      Assessment & Plan:  Depo-medrol 40 mg IM

## 2018-09-17 NOTE — Patient Instructions (Signed)
We have given you a shot depo-medrol today to help with the pain.   We have sent in a prescription for flexeril which is a muscle relaxer that you can use up to 3 times per day for pain if needed.   I would recommend to take ibuprofen twice a day for the next week to help as well.    Trochanteric Bursitis Rehab Ask your health care provider which exercises are safe for you. Do exercises exactly as told by your health care provider and adjust them as directed. It is normal to feel mild stretching, pulling, tightness, or discomfort as you do these exercises, but you should stop right away if you feel sudden pain or your pain gets worse.Do not begin these exercises until told by your health care provider. Stretching exercises These exercises warm up your muscles and joints and improve the movement and flexibility of your hip. These exercises also help to relieve pain and stiffness. Exercise A: Iliotibial band stretch  1. Lie on your side with your left / right leg in the top position. 2. Bend your left / right knee and grab your ankle. 3. Slowly bring your knee back so your thigh is behind your body. 4. Slowly lower your knee toward the floor until you feel a gentle stretch on the outside of your left / right thigh. If you do not feel a stretch and your knee will not fall farther, place the heel of your other foot on top of your outer knee and pull your thigh down farther. 5. Hold this position for __________ seconds. 6. Slowly return to the starting position. Repeat __________ times. Complete this exercise __________ times a day. Strengthening exercises These exercises build strength and endurance in your hip and pelvis. Endurance is the ability to use your muscles for a long time, even after they get tired. Exercise B: Bridge ( hip extensors) 1. Lie on your back on a firm surface with your knees bent and your feet flat on the floor. 2. Tighten your buttocks muscles and lift your buttocks off  the floor until your trunk is level with your thighs. You should feel the muscles working in your buttocks and the back of your thighs. If this exercise is too easy, try doing it with your arms crossed over your chest. 3. Hold this position for __________ seconds. 4. Slowly return to the starting position. 5. Let your muscles relax completely between repetitions. Repeat __________ times. Complete this exercise __________ times a day. Exercise C: Squats ( knee extensors and  quadriceps) 1. Stand in front of a table, with your feet and knees pointing straight ahead. You may rest your hands on the table for balance but not for support. 2. Slowly bend your knees and lower your hips like you are going to sit in a chair. ? Keep your weight over your heels, not over your toes. ? Keep your lower legs upright so they are parallel with the table legs. ? Do not let your hips go lower than your knees. ? Do not bend lower than told by your health care provider. ? If your hip pain increases, do not bend as low. 3. Hold this position for __________ seconds. 4. Slowly push with your legs to return to standing. Do not use your hands to pull yourself to standing. Repeat __________ times. Complete this exercise __________ times a day. Exercise D: Hip hike 1. Stand sideways on a bottom step. Stand on your left / right leg with  your other foot unsupported next to the step. You can hold onto the railing or wall if needed for balance. 2. Keeping your knees straight and your torso square, lift your left / right hip up toward the ceiling. 3. Hold this position for __________ seconds. 4. Slowly let your left / right hip lower toward the floor, past the starting position. Your foot should get closer to the floor. Do not lean or bend your knees. Repeat __________ times. Complete this exercise __________ times a day. Exercise E: Single leg stand 1. Stand near a counter or door frame that you can hold onto for balance as  needed. It is helpful to stand in front of a mirror for this exercise so you can watch your hip. 2. Squeeze your left / right buttock muscles then lift up your other foot. Do not let your left / right hip push out to the side. 3. Hold this position for __________ seconds. Repeat __________ times. Complete this exercise __________ times a day. This information is not intended to replace advice given to you by your health care provider. Make sure you discuss any questions you have with your health care provider. Document Released: 01/10/2005 Document Revised: 08/09/2016 Document Reviewed: 11/18/2015 Elsevier Interactive Patient Education  [employer].

## 2018-09-19 DIAGNOSIS — M7061 Trochanteric bursitis, right hip: Secondary | ICD-10-CM | POA: Insufficient documentation

## 2018-09-19 MED ORDER — METHYLPREDNISOLONE ACETATE 40 MG/ML IJ SUSP
40.0000 mg | Freq: Once | INTRAMUSCULAR | Status: AC
  Administered 2018-09-17: 40 mg via INTRAMUSCULAR

## 2018-09-19 NOTE — Assessment & Plan Note (Signed)
Suspect that this is muscular related to positional. Depo-medrol 40 mg IM given at visit and advised to take ibuprofen. Given stretching exercises. Given warning signs to return.

## 2018-09-24 ENCOUNTER — Encounter: Payer: Self-pay | Admitting: Internal Medicine

## 2018-09-24 DIAGNOSIS — R102 Pelvic and perineal pain: Secondary | ICD-10-CM

## 2018-09-29 ENCOUNTER — Other Ambulatory Visit: Payer: Self-pay | Admitting: Internal Medicine
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
## 2018-12-24 ENCOUNTER — Ambulatory Visit: Payer: 59 | Admitting: Family Medicine

## 2018-12-24 ENCOUNTER — Ambulatory Visit: Payer: Self-pay

## 2018-12-24 ENCOUNTER — Encounter: Payer: Self-pay | Admitting: Family Medicine

## 2018-12-24 VITALS — BP 110/80 | HR 60 | Ht 67.5 in | Wt 210.0 lb

## 2018-12-24 DIAGNOSIS — M999 Biomechanical lesion, unspecified: Secondary | ICD-10-CM | POA: Diagnosis not present

## 2018-12-24 DIAGNOSIS — M7712 Lateral epicondylitis, left elbow: Secondary | ICD-10-CM

## 2018-12-24 DIAGNOSIS — M25522 Pain in left elbow: Secondary | ICD-10-CM

## 2018-12-24 DIAGNOSIS — M7710 Lateral epicondylitis, unspecified elbow: Secondary | ICD-10-CM | POA: Insufficient documentation

## 2018-12-24 MED ORDER — NITROGLYCERIN 0.2 MG/HR TD PT24: MEDICATED_PATCH | TRANSDERMAL | 1 refills | Status: DC

## 2018-12-24 NOTE — Patient Instructions (Addendum)
Good to see you.  Ice 20 minutes 2 times daily. Usually after activity and before bed. pennsaid pinkie amount topically 2 times daily as needed. \ Wear brace day and night for 2 weeks then nightly for 2 weeks Avoid overhand lifting  Nitroglycerin Protocol   Apply 1/4 nitroglycerin patch to affected area daily.  Change position of patch within the affected area every 24 hours.  You may experience a headache during the first 1-2 weeks of using the patch, these should subside.  If you experience headaches after beginning nitroglycerin patch treatment, you may take your preferred over the counter pain reliever.  Another side effect of the nitroglycerin patch is skin irritation or rash related to patch adhesive.  Please notify our office if you develop more severe headaches or rash, and stop the patch.  Tendon healing with nitroglycerin patch may require 12 to 24 weeks depending on the extent of injury.  Men should not use if taking Viagra, Cialis, or Levitra.   Do not use if you have migraines or rosacea.   See me again in 4-6 weeks

## 2018-12-24 NOTE — Assessment & Plan Note (Deleted)
Decision today to treat with OMT was based on Physical Exam  After verbal consent patient was treated with HVLA, ME, FPR techniques in cervical, thoracic, lumbar and sacral areas  Patient tolerated the procedure well with improvement in symptoms  Patient given exercises, stretches and lifestyle modifications  See medications in patient instructions if given  Patient will follow up in 6-8 weeks 

## 2018-12-24 NOTE — Assessment & Plan Note (Signed)
Lateral Epicondylitis: Elbow anatomy was reviewed, and tendinopathy was explained.  Pt. given a formal rehab program. Series of concentric and eccentric exercises should be done starting with no weight, work up to 1 lb, hammer, etc.  Use counterforce strap if working or using hands.  Formal PT would be beneficial. Emphasized stretching an cross-friction massage Emphasized proper palms up lifting biomechanics to unload ECRB RTC in 6-8 weeks

## 2019-01-07 ENCOUNTER — Other Ambulatory Visit: Payer: Self-pay

## 2019-01-07 MED ORDER — FLUOXETINE HCL 20 MG PO CAPS: 40.0000 mg | ORAL_CAPSULE | Freq: Every day | ORAL | 3 refills | Status: DC
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
## 2019-01-21 ENCOUNTER — Encounter: Payer: Self-pay | Admitting: Family Medicine

## 2019-01-21 ENCOUNTER — Ambulatory Visit: Payer: 59 | Admitting: Family Medicine

## 2019-01-21 ENCOUNTER — Ambulatory Visit: Payer: Self-pay

## 2019-01-21 VITALS — BP 120/68 | HR 68 | Ht 67.5 in | Wt 211.0 lb

## 2019-01-21 DIAGNOSIS — M25522 Pain in left elbow: Secondary | ICD-10-CM | POA: Diagnosis not present

## 2019-01-21 DIAGNOSIS — M7712 Lateral epicondylitis, left elbow: Secondary | ICD-10-CM | POA: Diagnosis not present

## 2019-01-21 NOTE — Assessment & Plan Note (Signed)
Patient does have some intrasubstance tearing noted on ultrasound again today.  Patient has been wearing the wrist brace on a regular basis.  Do feel that some of the pain that he has is more tightness.  Encourage him to start decreasing the wear of his wrist brace slowly over the course of several weeks.  We discussed icing regimen home exercises.  Patient given alternative treatments including physical therapy and PRP or even corticosteroid injection which he declined all of them.  Patient will follow-up again in 6 weeks

## 2019-01-21 NOTE — Patient Instructions (Signed)
Good to see you  The is a tear but the nitro seems to be doing well OCntinue the exercise and ice at night Avoid all wrist extension  Try in 2 weeks to wear the brace less during the day though so you do not get too stiff  See me again in 4-6 weeks

## 2019-02-26 ENCOUNTER — Encounter: Payer: Self-pay | Admitting: Family Medicine

## 2019-02-26 ENCOUNTER — Ambulatory Visit: Payer: Commercial Managed Care - PPO | Admitting: Family Medicine

## 2019-02-26 ENCOUNTER — Ambulatory Visit: Payer: 59 | Admitting: Family Medicine

## 2019-02-26 ENCOUNTER — Other Ambulatory Visit: Payer: Self-pay

## 2019-02-26 VITALS — BP 110/70 | HR 61 | Ht 67.0 in | Wt 209.0 lb

## 2019-02-26 DIAGNOSIS — M7712 Lateral epicondylitis, left elbow: Secondary | ICD-10-CM

## 2019-02-26 DIAGNOSIS — M999 Biomechanical lesion, unspecified: Secondary | ICD-10-CM | POA: Diagnosis not present

## 2019-02-26 NOTE — Patient Instructions (Signed)
Overall not bad Nitro still daily one more month, then 3 times a week for 2 weeks Se eme again in 6 weeks and we will pop you again

## 2019-02-26 NOTE — Assessment & Plan Note (Signed)
Decision today to treat with OMT was based on Physical Exam  After verbal consent patient was treated with HVLA, ME, FPR techniques in  thoracic, lumbar and sacral areas  Patient tolerated the procedure well with improvement in symptoms  Patient given exercises, stretches and lifestyle modifications  See medications in patient instructions if given  Patient will follow up in 4-6 weeks 
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
## 2019-02-26 NOTE — Assessment & Plan Note (Signed)
Given improvement at this time.  Continue the nitroglycerin for 1 more month.  As long as patient is well can discontinue after that.

## 2019-02-27 ENCOUNTER — Other Ambulatory Visit: Payer: Self-pay | Admitting: Internal Medicine

## 2019-03-17 ENCOUNTER — Other Ambulatory Visit: Payer: Self-pay | Admitting: Internal Medicine

## 2019-07-08 ENCOUNTER — Other Ambulatory Visit: Payer: Self-pay | Admitting: Internal Medicine

## 2019-07-08 ENCOUNTER — Encounter: Payer: Self-pay | Admitting: Internal Medicine

## 2019-07-08 DIAGNOSIS — F39 Unspecified mood [affective] disorder: Secondary | ICD-10-CM

## 2019-07-08 MED ORDER — FLUOXETINE HCL 20 MG PO CAPS: 40.0000 mg | ORAL_CAPSULE | Freq: Every day | ORAL | 0 refills | Status: DC

## 2019-07-15 ENCOUNTER — Other Ambulatory Visit: Payer: Self-pay

## 2019-07-15 ENCOUNTER — Ambulatory Visit (INDEPENDENT_AMBULATORY_CARE_PROVIDER_SITE_OTHER): Payer: 59 | Admitting: Internal Medicine

## 2019-07-15 ENCOUNTER — Other Ambulatory Visit (INDEPENDENT_AMBULATORY_CARE_PROVIDER_SITE_OTHER): Payer: 59

## 2019-07-15 ENCOUNTER — Encounter: Payer: Self-pay | Admitting: Internal Medicine

## 2019-07-15 VITALS — BP 130/84 | HR 66 | Temp 98.4°F | Ht 67.0 in | Wt 208.0 lb

## 2019-07-15 DIAGNOSIS — F39 Unspecified mood [affective] disorder: Secondary | ICD-10-CM | POA: Diagnosis not present

## 2019-07-15 DIAGNOSIS — Z0000 Encounter for general adult medical examination without abnormal findings: Secondary | ICD-10-CM | POA: Diagnosis not present

## 2019-07-15 DIAGNOSIS — E782 Mixed hyperlipidemia: Secondary | ICD-10-CM

## 2019-07-15 LAB — CBC
HCT: 38.2 % — ABNORMAL LOW (ref 39.0–52.0)
Hemoglobin: 12.8 g/dL — ABNORMAL LOW (ref 13.0–17.0)
MCHC: 33.6 g/dL (ref 30.0–36.0)
MCV: 88.5 fl (ref 78.0–100.0)
Platelets: 334 10*3/uL (ref 150.0–400.0)
RBC: 4.31 Mil/uL (ref 4.22–5.81)
RDW: 13.1 % (ref 11.5–15.5)
WBC: 6.6 10*3/uL (ref 4.0–10.5)

## 2019-07-15 LAB — COMPREHENSIVE METABOLIC PANEL
ALT: 27 U/L (ref 0–53)
AST: 24 U/L (ref 0–37)
Albumin: 4.5 g/dL (ref 3.5–5.2)
Alkaline Phosphatase: 38 U/L — ABNORMAL LOW (ref 39–117)
BUN: 11 mg/dL (ref 6–23)
CO2: 25 mEq/L (ref 19–32)
Calcium: 9.5 mg/dL (ref 8.4–10.5)
Chloride: 106 mEq/L (ref 96–112)
Creatinine, Ser: 0.77 mg/dL (ref 0.40–1.50)
GFR: 111.91 mL/min (ref >60.00)
Glucose, Bld: 100 mg/dL — ABNORMAL HIGH (ref 70–99)
Potassium: 3.7 mEq/L (ref 3.5–5.1)
Sodium: 141 mEq/L (ref 135–145)
Total Bilirubin: 0.5 mg/dL (ref 0.2–1.2)
Total Protein: 7.3 g/dL (ref 6.0–8.3)

## 2019-07-15 LAB — LDL CHOLESTEROL, DIRECT: Direct LDL: 107 mg/dL

## 2019-07-15 LAB — LIPID PANEL
Cholesterol: 191 mg/dL (ref 0–200)
HDL: 37 mg/dL — ABNORMAL LOW (ref >39.00)
NonHDL: 154.4
Total CHOL/HDL Ratio: 5
Triglycerides: 267 mg/dL — ABNORMAL HIGH (ref 0.0–149.0)
VLDL: 53.4 mg/dL — ABNORMAL HIGH (ref 0.0–40.0)

## 2019-07-15 LAB — HEMOGLOBIN A1C: Hgb A1c MFr Bld: 6.3 % (ref 4.6–6.5)

## 2019-07-15 MED ORDER — FENOFIBRATE 145 MG PO TABS: 145.0000 mg | ORAL_TABLET | Freq: Every day | ORAL | 3 refills | Status: DC

## 2019-07-15 MED ORDER — FLUOXETINE HCL 20 MG PO CAPS: 40.0000 mg | ORAL_CAPSULE | Freq: Every day | ORAL | 3 refills | Status: DC

## 2019-07-15 MED ORDER — LIVALO 2 MG PO TABS: 1.0000 | ORAL_TABLET | Freq: Every day | ORAL | 3 refills | Status: DC

## 2019-07-15 NOTE — Patient Instructions (Signed)

## 2019-07-15 NOTE — Progress Notes (Signed)
   Subjective:   Patient ID: Jason Phillips, male    DOB: 1981/03/24, 38 y.o.   MRN: [id number]  HPI The patient is a 38 YO man coming in for physical.   PMH, [place], social history reviewed and updated  Review of Systems  Constitutional: Negative.   HENT: Negative.   Eyes: Negative.   Respiratory: Negative for cough, chest tightness and shortness of breath.   Cardiovascular: Negative for chest pain, palpitations and leg swelling.  Gastrointestinal: Negative for abdominal distention, abdominal pain, constipation, diarrhea, nausea and vomiting.  Musculoskeletal: Negative.   Skin: Negative.   Neurological: Negative.   Psychiatric/Behavioral: Negative.     Objective:  Physical Exam Constitutional:      Appearance: He is well-developed. He is obese.  HENT:     Head: Normocephalic and atraumatic.  Neck:     Musculoskeletal: Normal range of motion.  Cardiovascular:     Rate and Rhythm: Normal rate and regular rhythm.  Pulmonary:     Effort: Pulmonary effort is normal. No respiratory distress.     Breath sounds: Normal breath sounds. No wheezing or rales.  Abdominal:     General: Bowel sounds are normal. There is no distension.     Palpations: Abdomen is soft.     Tenderness: There is no abdominal tenderness. There is no rebound.  Skin:    General: Skin is warm and dry.  Neurological:     Mental Status: He is alert and oriented to person, place, and time.     Coordination: Coordination normal.     Vitals:   07/15/19 1117  BP: 130/84  Pulse: 66  Temp: 98.4 F (36.9 C)  TempSrc: Oral  SpO2: 97%  Weight: 208 lb (94.3 kg)  Height: 5\' 7"  (1.702 m)    Assessment & Plan:

## 2019-07-16 NOTE — Assessment & Plan Note (Signed)
Taking livalo and fenofibrate and checking lipid panel today. His triglycerides remain somewhat high despite therapy. He has not made lifestyle changes.

## 2019-07-16 NOTE — Assessment & Plan Note (Signed)
Taking prozac and this still seems to be helping. Coping okay with pandemic.

## 2019-07-16 NOTE — Assessment & Plan Note (Signed)
Flu shot counseled. Tetanus up to date. Counseled about sun safety and mole surveillance. Counseled about the dangers of distracted driving. Given 10 year screening recommendations.   

## 2019-07-22 ENCOUNTER — Encounter: Payer: Self-pay | Admitting: Internal Medicine

## 2019-07-22 DIAGNOSIS — E782 Mixed hyperlipidemia: Secondary | ICD-10-CM

## 2019-07-22 DIAGNOSIS — R7303 Prediabetes: Secondary | ICD-10-CM

## 2019-07-23 MED ORDER — FENOFIBRATE 160 MG PO TABS: 160.0000 mg | ORAL_TABLET | Freq: Every day | ORAL | 3 refills | Status: DC

## 2019-07-23 MED ORDER — OMEGA-3-ACID ETHYL ESTERS 1 G PO CAPS: 2.0000 g | ORAL_CAPSULE | Freq: Two times a day (BID) | ORAL | 6 refills | Status: DC

## 2019-08-26 ENCOUNTER — Encounter: Payer: Self-pay | Admitting: Family

## 2019-08-26 ENCOUNTER — Other Ambulatory Visit: Payer: Self-pay

## 2019-08-26 ENCOUNTER — Ambulatory Visit (INDEPENDENT_AMBULATORY_CARE_PROVIDER_SITE_OTHER): Payer: 59 | Admitting: Family

## 2019-08-26 DIAGNOSIS — Z20822 Contact with and (suspected) exposure to covid-19: Secondary | ICD-10-CM

## 2019-08-26 DIAGNOSIS — Z20828 Contact with and (suspected) exposure to other viral communicable diseases: Secondary | ICD-10-CM

## 2019-08-26 DIAGNOSIS — R197 Diarrhea, unspecified: Secondary | ICD-10-CM | POA: Diagnosis not present

## 2019-08-26 DIAGNOSIS — M791 Myalgia, unspecified site: Secondary | ICD-10-CM

## 2019-08-26 DIAGNOSIS — R6889 Other general symptoms and signs: Secondary | ICD-10-CM | POA: Diagnosis not present
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
## 2019-08-28 LAB — NOVEL CORONAVIRUS, NAA: SARS-CoV-2, NAA: NOT DETECTED

## 2019-11-06 ENCOUNTER — Ambulatory Visit: Payer: 59 | Admitting: Family Medicine

## 2019-12-21 ENCOUNTER — Encounter: Payer: Self-pay | Admitting: Internal Medicine

## 2019-12-28 ENCOUNTER — Ambulatory Visit: Payer: 59 | Attending: Internal Medicine

## 2019-12-28 DIAGNOSIS — Z20822 Contact with and (suspected) exposure to covid-19: Secondary | ICD-10-CM | POA: Diagnosis not present

## 2019-12-29 LAB — NOVEL CORONAVIRUS, NAA: SARS-CoV-2, NAA: NOT DETECTED

## 2020-01-26 ENCOUNTER — Encounter: Payer: Self-pay | Admitting: Internal Medicine

## 2020-01-26 MED ORDER — SUMATRIPTAN SUCCINATE 100 MG PO TABS: 50.0000 mg | ORAL_TABLET | ORAL | 1 refills | Status: DC | PRN

## 2020-02-19 ENCOUNTER — Ambulatory Visit: Payer: 59 | Admitting: Internal Medicine

## 2020-02-19 ENCOUNTER — Other Ambulatory Visit: Payer: Self-pay

## 2020-02-19 ENCOUNTER — Encounter: Payer: Self-pay | Admitting: Internal Medicine

## 2020-02-19 VITALS — BP 124/82 | HR 58 | Temp 98.2°F | Ht 67.0 in | Wt 212.0 lb

## 2020-02-19 DIAGNOSIS — R945 Abnormal results of liver function studies: Secondary | ICD-10-CM

## 2020-02-19 DIAGNOSIS — G43809 Other migraine, not intractable, without status migrainosus: Secondary | ICD-10-CM

## 2020-02-19 DIAGNOSIS — E782 Mixed hyperlipidemia: Secondary | ICD-10-CM

## 2020-02-19 DIAGNOSIS — R7989 Other specified abnormal findings of blood chemistry: Secondary | ICD-10-CM

## 2020-02-19 LAB — COMPREHENSIVE METABOLIC PANEL
ALT: 29 U/L (ref 0–53)
AST: 23 U/L (ref 0–37)
Albumin: 4.3 g/dL (ref 3.5–5.2)
Alkaline Phosphatase: 32 U/L — ABNORMAL LOW (ref 39–117)
BUN: 14 mg/dL (ref 6–23)
CO2: 25 mEq/L (ref 19–32)
Calcium: 9.5 mg/dL (ref 8.4–10.5)
Chloride: 108 mEq/L (ref 96–112)
Creatinine, Ser: 0.75 mg/dL (ref 0.40–1.50)
GFR: 115.02 mL/min (ref >60.00)
Glucose, Bld: 103 mg/dL — ABNORMAL HIGH (ref 70–99)
Potassium: 3.9 mEq/L (ref 3.5–5.1)
Sodium: 141 mEq/L (ref 135–145)
Total Bilirubin: 0.5 mg/dL (ref 0.2–1.2)
Total Protein: 7.2 g/dL (ref 6.0–8.3)

## 2020-02-19 LAB — LIPID PANEL
Cholesterol: 200 mg/dL (ref 0–200)
HDL: 43.4 mg/dL (ref >39.00)
LDL Cholesterol: 124 mg/dL — ABNORMAL HIGH (ref 0–99)
NonHDL: 156.78
Total CHOL/HDL Ratio: 5
Triglycerides: 164 mg/dL — ABNORMAL HIGH (ref 0.0–149.0)
VLDL: 32.8 mg/dL (ref 0.0–40.0)

## 2020-02-19 LAB — HEMOGLOBIN A1C: Hgb A1c MFr Bld: 6 % (ref 4.6–6.5)

## 2020-02-19 NOTE — Assessment & Plan Note (Signed)
Recheck LFTs today. Working on weight loss.

## 2020-02-19 NOTE — Assessment & Plan Note (Signed)
Checking lipid panel and adjust as needed. Taking livalo and lovaza.

## 2020-02-19 NOTE — Progress Notes (Signed)
   Subjective:   Patient ID: Jason Phillips, male    DOB: 1981/02/03, 39 y.o.   MRN: [id number]  HPI The patient is a 39 YO man coming in for headaches (has had 3 in the last week, taken sumatriptan without relief, took ibuprofen with some relief, lasting 8-12 hours, starting mostly at night, some pollen symptoms also, denies fevers or chills), and cholesterol (taking livalo and needs recheck of cholesterol levels, denies side effects), and LFTs (needs recheck, still avoiding sugary sodas and working on weight loss).   Review of Systems  Constitutional: Negative.   HENT: Negative.   Eyes: Negative.   Respiratory: Negative for cough, chest tightness and shortness of breath.   Cardiovascular: Negative for chest pain, palpitations and leg swelling.  Gastrointestinal: Negative for abdominal distention, abdominal pain, constipation, diarrhea, nausea and vomiting.  Musculoskeletal: Negative.   Skin: Negative.   Neurological: Positive for headaches.  Psychiatric/Behavioral: Negative.     Objective:  Physical Exam Constitutional:      Appearance: He is well-developed.  HENT:     Head: Normocephalic and atraumatic.  Cardiovascular:     Rate and Rhythm: Normal rate and regular rhythm.  Pulmonary:     Effort: Pulmonary effort is normal. No respiratory distress.     Breath sounds: Normal breath sounds. No wheezing or rales.  Abdominal:     General: Bowel sounds are normal. There is no distension.     Palpations: Abdomen is soft.     Tenderness: There is no abdominal tenderness. There is no rebound.  Musculoskeletal:     Cervical back: Normal range of motion.  Skin:    General: Skin is warm and dry.  Neurological:     Mental Status: He is alert and oriented to person, place, and time.     Coordination: Coordination normal.     Vitals:   02/19/20 0912  BP: 124/82  Pulse: (!) 58  Temp: 98.2 F (36.8 C)  TempSrc: Oral  SpO2: 98%  Weight: 212 lb (96.2 kg)  Height: 5\' 7"   (1.702 m)    This visit occurred during the SARS-CoV-2 public health emergency.  Safety protocols were in place, including screening questions prior to the visit, additional usage of staff PPE, and extensive cleaning of exam room while observing appropriate contact time as indicated for disinfecting solutions.   Assessment & Plan:

## 2020-02-19 NOTE — Patient Instructions (Signed)
We would like you to start taking zyrtec or claritin daily for the next 1-2 weeks to see if this helps reduce the headaches.   We have given you a sample of ubrelvy to try for the headaches. It is okay to take ibuprofen if needed.

## 2020-02-19 NOTE — Assessment & Plan Note (Signed)
May be related to sinus issues. Advised to take zyrtec daily for 1-2 weeks to see if this helps. Given sample of ubrelvy 50 mg and 100 mg to see if this helps better than sumatriptan. Can prescribe if effective.

## 2020-06-24 ENCOUNTER — Ambulatory Visit: Payer: 59 | Admitting: Internal Medicine

## 2020-06-24 ENCOUNTER — Encounter: Payer: Self-pay | Admitting: Internal Medicine

## 2020-06-24 ENCOUNTER — Other Ambulatory Visit: Payer: Self-pay

## 2020-06-24 DIAGNOSIS — H5711 Ocular pain, right eye: Secondary | ICD-10-CM | POA: Diagnosis not present

## 2020-06-24 DIAGNOSIS — T1590XA Foreign body on external eye, part unspecified, unspecified eye, initial encounter: Secondary | ICD-10-CM | POA: Insufficient documentation

## 2020-06-24 DIAGNOSIS — T1591XA Foreign body on external eye, part unspecified, right eye, initial encounter: Secondary | ICD-10-CM | POA: Diagnosis not present

## 2020-06-24 NOTE — Progress Notes (Signed)
   Subjective:   Patient ID: Jason Phillips, male    DOB: 19-Oct-1981, 39 y.o.   MRN: [id number]  HPI The patient is a 39 YO man coming in for pain and redness of the right eye. 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 Usually wears eye protection but was getting something low to ground and did not wear on Wednesday. Does not recall anything hitting his eye or any bugs flying into his eye. That evening he started having pain and redness in the eye. Tried putting some drops in it which did not help. Next day his wife did flush his eye well with sterile saline which did not help. Upper eyelid is slightly swollen, eye drains some clear fluid throughout the day. Still hurting. Some red in the white part of the eye. Denies vision loss, blurred vision, double vision. Does not have an eye provider.   Review of Systems  Constitutional: Negative.   HENT: Negative.   Eyes: Positive for pain and redness.  Respiratory: Negative for cough, chest tightness and shortness of breath.   Cardiovascular: Negative for chest pain, palpitations and leg swelling.  Gastrointestinal: Negative for abdominal distention, abdominal pain, constipation, diarrhea, nausea and vomiting.  Musculoskeletal: Negative.   Skin: Negative.   Neurological: Negative.   Psychiatric/Behavioral: Negative.     Objective:  Physical Exam Constitutional:      Appearance: He is well-developed.  HENT:     Head: Normocephalic and atraumatic.  Eyes:     Extraocular Movements: Extraocular movements intact.     Pupils: Pupils are equal, round, and reactive to light.     Comments: Right eye with some broken blood vessels right eye. Pupil reactive and ROM normal. No debris appreciated in the right eye. Right upper eyelid mildly swollen and tender to touch. Lower right eyelid normal.   Cardiovascular:     Rate and Rhythm: Normal rate and regular rhythm.  Pulmonary:     Effort: Pulmonary effort is normal. No  respiratory distress.     Breath sounds: Normal breath sounds. No wheezing or rales.  Abdominal:     General: Bowel sounds are normal. There is no distension.     Palpations: Abdomen is soft.     Tenderness: There is no abdominal tenderness. There is no rebound.  Musculoskeletal:     Cervical back: Normal range of motion.  Skin:    General: Skin is warm and dry.  Neurological:     Mental Status: He is alert and oriented to person, place, and time.     Coordination: Coordination normal.     Vitals:   06/24/20 1009  BP: 130/82  Pulse: 70  Temp: 98.7 F (37.1 C)  TempSrc: Oral  SpO2: 98%  Weight: 215 lb (97.5 kg)  Height: 5\' 7"  (1.702 m)    This visit occurred during the SARS-CoV-2 public health emergency.  Safety protocols were in place, including screening questions prior to the visit, additional usage of staff PPE, and extensive cleaning of exam room while observing appropriate contact time as indicated for disinfecting solutions.   Assessment & Plan:

## 2020-06-24 NOTE — Assessment & Plan Note (Signed)
Advised that likely he did get something in the eye which has since left the eye. Should heal in 1-2 weeks. If desired they can seek care at urgent care to have testing for small tear on eye although this is not likely to impact management. Long term he is advised to seek out an eye doctor for evaluation and management of his eyes to screen for treatable eye conditions.

## 2020-06-24 NOTE — Patient Instructions (Signed)
You can use saline drops in the eyes and cool compress or ice on the eyelid to help with swelling.  You can go to an urgent care or eye doctor to check for cut on the eyeball although typically these heal themselves and do not require special treatment.

## 2020-07-20 DIAGNOSIS — H179 Unspecified corneal scar and opacity: Secondary | ICD-10-CM | POA: Insufficient documentation

## 2020-07-20 DIAGNOSIS — S0501XA Injury of conjunctiva and corneal abrasion without foreign body, right eye, initial encounter: Secondary | ICD-10-CM | POA: Diagnosis not present

## 2020-08-04 ENCOUNTER — Other Ambulatory Visit: Payer: Self-pay | Admitting: Internal Medicine

## 2020-08-04 DIAGNOSIS — F39 Unspecified mood [affective] disorder: Secondary | ICD-10-CM

## 2020-08-05 DIAGNOSIS — Z23 Encounter for immunization: Secondary | ICD-10-CM | POA: Diagnosis not present

## 2020-08-09 ENCOUNTER — Other Ambulatory Visit: Payer: Self-pay | Admitting: Internal Medicine

## 2020-08-09 ENCOUNTER — Encounter: Payer: Self-pay | Admitting: Internal Medicine

## 2020-08-09 DIAGNOSIS — F39 Unspecified mood [affective] disorder: Secondary | ICD-10-CM

## 2020-08-09 MED ORDER — OMEGA-3-ACID ETHYL ESTERS 1 G PO CAPS: 2.0000 g | ORAL_CAPSULE | Freq: Two times a day (BID) | ORAL | 6 refills | Status: DC

## 2020-08-09 MED ORDER — FLUOXETINE HCL 20 MG PO CAPS: 40.0000 mg | ORAL_CAPSULE | Freq: Every day | ORAL | 3 refills | Status: DC

## 2020-08-24 ENCOUNTER — Other Ambulatory Visit: Payer: Self-pay | Admitting: Internal Medicine

## 2020-10-03 ENCOUNTER — Other Ambulatory Visit: Payer: Self-pay | Admitting: Internal Medicine

## 2020-12-28 ENCOUNTER — Telehealth: Payer: Self-pay | Admitting: Internal Medicine

## 2020-12-28 NOTE — Telephone Encounter (Signed)
Fine with me

## 2020-12-28 NOTE — Telephone Encounter (Signed)
Yes, I will see him.

## 2020-12-28 NOTE — Telephone Encounter (Signed)
Patient wondering if he can do a transfer of care from Dr. Okey Dupre to Dr. Yetta Barre. Let me know. Thank you.

## 2021-02-21 ENCOUNTER — Other Ambulatory Visit: Payer: Self-pay

## 2021-02-21 ENCOUNTER — Encounter: Payer: Self-pay | Admitting: Internal Medicine

## 2021-02-21 ENCOUNTER — Ambulatory Visit: Payer: 59 | Admitting: Internal Medicine

## 2021-02-21 VITALS — BP 118/78 | HR 66 | Temp 98.3°F | Ht 67.0 in | Wt 212.0 lb

## 2021-02-21 DIAGNOSIS — K219 Gastro-esophageal reflux disease without esophagitis: Secondary | ICD-10-CM

## 2021-02-21 DIAGNOSIS — Z0000 Encounter for general adult medical examination without abnormal findings: Secondary | ICD-10-CM | POA: Diagnosis not present

## 2021-02-21 DIAGNOSIS — R7303 Prediabetes: Secondary | ICD-10-CM

## 2021-02-21 DIAGNOSIS — R7989 Other specified abnormal findings of blood chemistry: Secondary | ICD-10-CM

## 2021-02-21 DIAGNOSIS — G43409 Hemiplegic migraine, not intractable, without status migrainosus: Secondary | ICD-10-CM | POA: Diagnosis not present

## 2021-02-21 DIAGNOSIS — R945 Abnormal results of liver function studies: Secondary | ICD-10-CM | POA: Diagnosis not present

## 2021-02-21 LAB — BASIC METABOLIC PANEL
BUN: 15 mg/dL (ref 6–23)
CO2: 25 mEq/L (ref 19–32)
Calcium: 9.7 mg/dL (ref 8.4–10.5)
Chloride: 110 mEq/L (ref 96–112)
Creatinine, Ser: 0.78 mg/dL (ref 0.40–1.50)
GFR: 110.72 mL/min (ref >60.00)
Glucose, Bld: 119 mg/dL — ABNORMAL HIGH (ref 70–99)
Potassium: 4 mEq/L (ref 3.5–5.1)
Sodium: 143 mEq/L (ref 135–145)

## 2021-02-21 LAB — CBC WITH DIFFERENTIAL/PLATELET
Basophils Absolute: 0.1 10*3/uL (ref 0.0–0.1)
Basophils Relative: 0.9 % (ref 0.0–3.0)
Eosinophils Absolute: 0.1 10*3/uL (ref 0.0–0.7)
Eosinophils Relative: 1.8 % (ref 0.0–5.0)
HCT: 38.4 % — ABNORMAL LOW (ref 39.0–52.0)
Hemoglobin: 13.4 g/dL (ref 13.0–17.0)
Lymphocytes Relative: 39.6 % (ref 12.0–46.0)
Lymphs Abs: 3.1 10*3/uL (ref 0.7–4.0)
MCHC: 34.8 g/dL (ref 30.0–36.0)
MCV: 88 fl (ref 78.0–100.0)
Monocytes Absolute: 0.6 10*3/uL (ref 0.1–1.0)
Monocytes Relative: 8.1 % (ref 3.0–12.0)
Neutro Abs: 3.9 10*3/uL (ref 1.4–7.7)
Neutrophils Relative %: 49.6 % (ref 43.0–77.0)
Platelets: 322 10*3/uL (ref 150.0–400.0)
RBC: 4.37 Mil/uL (ref 4.22–5.81)
RDW: 13.2 % (ref 11.5–15.5)
WBC: 7.9 10*3/uL (ref 4.0–10.5)

## 2021-02-21 LAB — HEPATIC FUNCTION PANEL
ALT: 26 U/L (ref 0–53)
AST: 22 U/L (ref 0–37)
Albumin: 4.4 g/dL (ref 3.5–5.2)
Alkaline Phosphatase: 34 U/L — ABNORMAL LOW (ref 39–117)
Bilirubin, Direct: 0.1 mg/dL (ref 0.0–0.3)
Total Bilirubin: 0.4 mg/dL (ref 0.2–1.2)
Total Protein: 7.2 g/dL (ref 6.0–8.3)

## 2021-02-21 LAB — POCT GLYCOSYLATED HEMOGLOBIN (HGB A1C): Hemoglobin A1C: 5.9 % — AB (ref 4.0–5.6)

## 2021-02-21 LAB — LIPID PANEL
Cholesterol: 189 mg/dL (ref 0–200)
HDL: 40 mg/dL (ref >39.00)
NonHDL: 149.1
Total CHOL/HDL Ratio: 5
Triglycerides: 216 mg/dL — ABNORMAL HIGH (ref 0.0–149.0)
VLDL: 43.2 mg/dL — ABNORMAL HIGH (ref 0.0–40.0)

## 2021-02-21 LAB — LDL CHOLESTEROL, DIRECT: Direct LDL: 106 mg/dL

## 2021-02-21 MED ORDER — ACCU-CHEK GUIDE ME W/DEVICE KIT: 1.0000 | PACK | Freq: Two times a day (BID) | 0 refills | Status: DC

## 2021-02-21 MED ORDER — QULIPTA 60 MG PO TABS: 1.0000 | ORAL_TABLET | Freq: Every day | ORAL | 1 refills | Status: DC

## 2021-02-21 NOTE — Patient Instructions (Signed)

## 2021-02-21 NOTE — Progress Notes (Signed)
Subjective:  Patient ID: Jason Phillips, male    DOB: 1981-01-19  Age: 40 y.o. MRN: [id number]  CC: Annual Exam, Gastroesophageal Reflux, and Headache  This visit occurred during the SARS-CoV-2 public health emergency.  Safety protocols were in place, including screening questions prior to the visit, additional usage of staff PPE, and extensive cleaning of exam room while observing appropriate contact time as indicated for disinfecting solutions.    HPI Jason Phillips presents for a CPX.  He complains of a 2-year history of headaches.  He describes an intermittent throbbing and pounding sensation in the left frontal region.  He rarely has nausea and does not vomit.  His headaches have increased recently up to 3 times per week.  He has mild photo and phonophobia.  He does not have any trouble with his vision or hearing and he denies paresthesias.  He is not aware of a particular trigger.  He does get some symptom relief with the triptan.  He has a history of prediabetes, GERD, anemia, and elevated liver enzymes.  Outpatient Medications Prior to Visit  Medication Sig Dispense Refill  . Diclofenac Sodium 2 % SOLN Apply 1 pump twice daily 112 g 3  . fenofibrate 160 MG tablet TAKE 1 TABLET (160 MG TOTAL) BY MOUTH DAILY. 90 tablet 1  . FLUoxetine (PROZAC) 20 MG capsule Take 2 capsules (40 mg total) by mouth daily. 180 capsule 3  . ibuprofen (ADVIL,MOTRIN) 800 MG tablet Take 800 mg by mouth every 8 (eight) hours as needed for moderate pain.    Marland Kitchen LIVALO 2 MG TABS TAKE 1 TABLET (2 MG TOTAL) BY MOUTH DAILY. 90 tablet 1  . nitroGLYCERIN (NITRODUR - DOSED IN MG/24 HR) 0.2 mg/hr patch 1/4 patch daily 30 patch 1  . omega-3 acid ethyl esters (LOVAZA) 1 g capsule Take 2 capsules (2 g total) by mouth 2 (two) times daily. 120 capsule 6  . SUMAtriptan (IMITREX) 100 MG tablet Take 0.5-1 tablets (50-100 mg total) by mouth every 2 (two) hours as needed for migraine. 10 tablet 1  . diphenhydrAMINE  (BENADRYL) 25 mg capsule Take 25 mg by mouth at bedtime.      No facility-administered medications prior to visit.    ROS Review of Systems  Constitutional: Negative.  Negative for diaphoresis, fatigue and unexpected weight change.  HENT: Negative.   Eyes: Negative for visual disturbance.  Respiratory: Negative for cough, chest tightness, shortness of breath and wheezing.   Cardiovascular: Negative for chest pain, palpitations and leg swelling.  Gastrointestinal: Negative for abdominal pain, constipation, diarrhea, nausea and vomiting.  Endocrine: Negative.   Genitourinary: Negative.  Negative for penile swelling, scrotal swelling and testicular pain.  Musculoskeletal: Negative for arthralgias, back pain, myalgias and neck pain.  Skin: Negative.  Negative for color change and pallor.  Neurological: Positive for headaches. Negative for dizziness, seizures, weakness and light-headedness.  Hematological: Negative for adenopathy. Does not bruise/bleed easily.  Psychiatric/Behavioral: Negative.     Objective:  BP 118/78   Pulse 66   Temp 98.3 F (36.8 C) (Oral)   Ht 5' 7"  (1.702 m)   Wt 212 lb (96.2 kg)   SpO2 96%   BMI 33.20 kg/m   BP Readings from Last 3 Encounters:  02/21/21 118/78  06/24/20 130/82  02/19/20 124/82    Wt Readings from Last 3 Encounters:  02/21/21 212 lb (96.2 kg)  06/24/20 215 lb (97.5 kg)  02/19/20 212 lb (96.2 kg)    Physical Exam Vitals reviewed.  Constitutional:      Appearance: He is well-developed.  HENT:     Nose: Nose normal.     Mouth/Throat:     Mouth: Mucous membranes are moist.  Eyes:     General: No scleral icterus.    Pupils: Pupils are equal, round, and reactive to light.  Cardiovascular:     Rate and Rhythm: Normal rate and regular rhythm.     Heart sounds: No murmur heard.   Pulmonary:     Effort: Pulmonary effort is normal.     Breath sounds: No stridor. No wheezing, rhonchi or rales.  Abdominal:     General: Abdomen is  protuberant. Bowel sounds are normal. There is no distension.     Palpations: Abdomen is soft. There is no hepatomegaly, splenomegaly or mass.     Tenderness: There is no abdominal tenderness.     Hernia: No hernia is present.  Musculoskeletal:        General: Normal range of motion.     Cervical back: Neck supple.     Right lower leg: No edema.     Left lower leg: No edema.  Lymphadenopathy:     Cervical: No cervical adenopathy.  Skin:    General: Skin is warm and dry.  Neurological:     General: No focal deficit present.     Mental Status: He is alert and oriented to person, place, and time. Mental status is at baseline.     Cranial Nerves: No cranial nerve deficit.     Sensory: No sensory deficit.     Motor: No weakness.     Coordination: Coordination normal.     Gait: Gait normal.     Deep Tendon Reflexes: Reflexes normal.  Psychiatric:        Mood and Affect: Mood normal.     Lab Results  Component Value Date   WBC 7.9 02/21/2021   HGB 13.4 02/21/2021   HCT 38.4 (L) 02/21/2021   PLT 322.0 02/21/2021   GLUCOSE 119 (H) 02/21/2021   CHOL 189 02/21/2021   TRIG 216.0 (H) 02/21/2021   HDL 40.00 02/21/2021   LDLDIRECT 106.0 02/21/2021   LDLCALC 124 (H) 02/19/2020   ALT 26 02/21/2021   AST 22 02/21/2021   NA 143 02/21/2021   K 4.0 02/21/2021   CL 110 02/21/2021   CREATININE 0.78 02/21/2021   BUN 15 02/21/2021   CO2 25 02/21/2021   TSH 2.33 05/15/2016   INR 1.0 03/28/2008   HGBA1C 5.9 (A) 02/21/2021    DG Foot Complete Left  Result Date: 05/05/2018 CLINICAL DATA:  Left foot pain after injury 2 months ago. EXAM: LEFT FOOT - COMPLETE 3+ VIEW COMPARISON:  None. FINDINGS: There is no evidence of fracture or dislocation. There is no evidence of arthropathy or other focal bone abnormality. Soft tissues are unremarkable. IMPRESSION: Normal left foot. Electronically Signed   By: Marijo Conception, M.D.   On: 05/05/2018 10:20    Assessment & Plan:   Jason Phillips was seen today  for annual exam, gastroesophageal reflux and headache.  Diagnoses and all orders for this visit:  Routine general medical examination at a health care facility- Exam completed, labs reviewed, vaccines reviewed and updated, no cancer screenings are indicated, patient education material was given. -     Lipid panel; Future -     Hepatitis C antibody; Future -     HIV Antibody (routine testing w rflx); Future -     HIV Antibody (routine testing  w rflx) -     Hepatitis C antibody -     Lipid panel  Gastroesophageal reflux disease without esophagitis- He is asymptomatic with this.  No complications noted. -     CBC with Differential/Platelet; Future -     CBC with Differential/Platelet  Hemiplegic migraine without status migrainosus, not intractable- He is having frequent migraine headaches.  I recommended that he take a daily CGRP antagonist to prevent and treat migraine headaches. -     Atogepant (QULIPTA) 60 MG TABS; Take 1 tablet by mouth daily.  Abnormal LFTs (liver function tests)- His LFTs are much better.  Screening for viral hepatitis is negative.  I recommended that he get vaccinated against hepatitis A and B. -     Hepatic function panel; Future -     Hepatitis B surface antibody,quantitative; Future -     Hepatitis B surface antigen; Future -     Hepatitis A antibody, total; Future -     Hepatitis B core antibody, total; Future -     Hepatitis B core antibody, total -     Hepatitis A antibody, total -     Hepatitis B surface antigen -     Hepatitis B surface antibody,quantitative -     Hepatic function panel  Pre-diabetes- His A1c is 5.9%.  Medical therapy is not indicated. -     POCT glycosylated hemoglobin (Hb A1C) -     Basic metabolic panel; Future -     Discontinue: Blood Glucose Monitoring Suppl (ACCU-CHEK GUIDE ME) w/Device KIT; 1 Act by Does not apply route in the morning and at bedtime. -     Basic metabolic panel  Other orders -     LDL cholesterol,  direct   I have discontinued Aedin B. Maltese "Tim"'s diphenhydrAMINE. I am also having him start on Qulipta. Additionally, I am having him maintain his ibuprofen, Diclofenac Sodium, nitroGLYCERIN, FLUoxetine, omega-3 acid ethyl esters, Livalo, and fenofibrate.  Meds ordered this encounter  Medications  . Atogepant (QULIPTA) 60 MG TABS    Sig: Take 1 tablet by mouth daily.    Dispense:  90 tablet    Refill:  1  . DISCONTD: Blood Glucose Monitoring Suppl (ACCU-CHEK GUIDE ME) w/Device KIT    Sig: 1 Act by Does not apply route in the morning and at bedtime.    Dispense:  1 kit    Refill:  0     Follow-up: Return in about 6 months (around 08/24/2021).  Scarlette Calico, MD

## 2021-02-22 ENCOUNTER — Encounter: Payer: Self-pay | Admitting: Internal Medicine

## 2021-02-22 DIAGNOSIS — R7303 Prediabetes: Secondary | ICD-10-CM

## 2021-02-22 LAB — HEPATITIS B SURFACE ANTIGEN: Hepatitis B Surface Ag: NONREACTIVE

## 2021-02-22 LAB — HIV ANTIBODY (ROUTINE TESTING W REFLEX): HIV 1&2 Ab, 4th Generation: NONREACTIVE

## 2021-02-22 LAB — HEPATITIS B CORE ANTIBODY, TOTAL: Hep B Core Total Ab: NONREACTIVE

## 2021-02-22 LAB — HEPATITIS C ANTIBODY
Hepatitis C Ab: NONREACTIVE
SIGNAL TO CUT-OFF: 0.01 (ref <1.00)

## 2021-02-22 LAB — HEPATITIS A ANTIBODY, TOTAL: Hepatitis A AB,Total: NONREACTIVE

## 2021-02-22 LAB — HEPATITIS B SURFACE ANTIBODY, QUANTITATIVE: Hep B S AB Quant (Post): <5 m[IU]/mL — ABNORMAL LOW (ref >=10)

## 2021-02-22 MED ORDER — ACCU-CHEK GUIDE ME W/DEVICE KIT: 1.0000 | PACK | Freq: Two times a day (BID) | 0 refills | Status: DC

## 2021-02-23 ENCOUNTER — Other Ambulatory Visit: Payer: Self-pay | Admitting: Internal Medicine

## 2021-02-24 ENCOUNTER — Other Ambulatory Visit: Payer: Self-pay | Admitting: Internal Medicine

## 2021-02-27 ENCOUNTER — Other Ambulatory Visit: Payer: Self-pay | Admitting: Internal Medicine

## 2021-02-27 NOTE — Telephone Encounter (Signed)
See below

## 2021-02-28 ENCOUNTER — Encounter: Payer: Self-pay | Admitting: Internal Medicine

## 2021-02-28 ENCOUNTER — Other Ambulatory Visit (HOSPITAL_COMMUNITY): Payer: Self-pay | Admitting: Internal Medicine

## 2021-02-28 DIAGNOSIS — R7303 Prediabetes: Secondary | ICD-10-CM

## 2021-02-28 MED ORDER — BLOOD GLUCOSE METER KIT: PACK | 0 refills | Status: AC
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
## 2021-03-23 ENCOUNTER — Other Ambulatory Visit: Payer: Self-pay

## 2021-03-23 ENCOUNTER — Encounter: Payer: Self-pay | Admitting: Family Medicine

## 2021-03-23 ENCOUNTER — Ambulatory Visit: Payer: Self-pay

## 2021-03-23 ENCOUNTER — Other Ambulatory Visit (HOSPITAL_COMMUNITY): Payer: Self-pay

## 2021-03-23 ENCOUNTER — Ambulatory Visit: Payer: 59 | Admitting: Family Medicine

## 2021-03-23 VITALS — BP 140/92 | HR 69 | Ht 67.0 in | Wt 213.0 lb

## 2021-03-23 DIAGNOSIS — M7712 Lateral epicondylitis, left elbow: Secondary | ICD-10-CM

## 2021-03-23 DIAGNOSIS — M7711 Lateral epicondylitis, right elbow: Secondary | ICD-10-CM | POA: Diagnosis not present

## 2021-03-23 DIAGNOSIS — M25522 Pain in left elbow: Secondary | ICD-10-CM | POA: Diagnosis not present

## 2021-03-23 DIAGNOSIS — M25521 Pain in right elbow: Secondary | ICD-10-CM | POA: Diagnosis not present

## 2021-03-23 MED ORDER — PREDNISONE 20 MG PO TABS
20.0000 mg | ORAL_TABLET | Freq: Every day | ORAL | 0 refills | Status: DC
  Filled 2021-03-23: qty 5, 5d supply, fill #0

## 2021-03-23 NOTE — Assessment & Plan Note (Signed)
Bilateral lateral epicondylitis.  Patient has significant partial tearing even noted on the right side.  Discussed with patient due to patient's job likely is contributing to it.  Patient is unable to tolerate the nitroglycerin patches at the moment.  Getting worsening headaches.  Discussed with patient about icing regimen and home exercises.  Patient has done the wrist bracing.  We will get patient to see me again in 2 weeks for PRP.  Warned of potential side effects.  Patient is going out of town this week.  And will be given a short course of prednisone that I think will be beneficial.  Patient knows not to take anti-inflammatories.  Patient is married to a pharmacist.  We will see patient back again in 2 weeks

## 2021-03-23 NOTE — Patient Instructions (Addendum)
Good to see you Prednisone 20 mg daily for 5 days Read about PRP We will have to wait until we get PRP kit. We will let you know Schedule an appointment for 3 weeks

## 2021-04-04 ENCOUNTER — Other Ambulatory Visit: Payer: Self-pay | Admitting: Internal Medicine

## 2021-04-04 ENCOUNTER — Other Ambulatory Visit (HOSPITAL_COMMUNITY): Payer: Self-pay

## 2021-04-04 MED ORDER — FENOFIBRATE 160 MG PO TABS
160.0000 mg | ORAL_TABLET | Freq: Every day | ORAL | 1 refills | Status: DC
  Filled 2021-04-04: qty 90, 90d supply, fill #0
  Filled 2021-07-04: qty 90, 90d supply, fill #1
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
## 2021-04-06 ENCOUNTER — Other Ambulatory Visit: Payer: Self-pay

## 2021-04-06 ENCOUNTER — Ambulatory Visit: Payer: Self-pay | Admitting: Family Medicine

## 2021-04-06 ENCOUNTER — Ambulatory Visit: Payer: Self-pay

## 2021-04-06 ENCOUNTER — Encounter: Payer: Self-pay | Admitting: Family Medicine

## 2021-04-06 VITALS — BP 130/90 | HR 61 | Ht 67.0 in | Wt 209.0 lb

## 2021-04-06 DIAGNOSIS — M25522 Pain in left elbow: Secondary | ICD-10-CM

## 2021-04-06 DIAGNOSIS — M25521 Pain in right elbow: Secondary | ICD-10-CM

## 2021-04-06 DIAGNOSIS — M7711 Lateral epicondylitis, right elbow: Secondary | ICD-10-CM

## 2021-04-06 DIAGNOSIS — M7712 Lateral epicondylitis, left elbow: Secondary | ICD-10-CM

## 2021-04-06 NOTE — Assessment & Plan Note (Signed)
Bilateral PRP done today.  Tolerated the procedure well.  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  Does have trying to avoid certain activities.  Discussed avoiding potentially some of the anti-inflammatories and icing for the next 72 hours.  Patient knows to seek medical attention though if any type of redness, irritation increasing discomfort.  After 72 hours can go back to the ibuprofen.  Given home exercises to slowly increase over the next progression.  Follow-up with me again 5 to 6 weeks otherwise.

## 2021-04-06 NOTE — Patient Instructions (Signed)
Good to see you See me again in 6 weeks 

## 2021-04-11 ENCOUNTER — Other Ambulatory Visit (HOSPITAL_COMMUNITY): Payer: Self-pay

## 2021-04-11 MED FILL — Omega-3-acid Ethyl Esters Cap 1 GM: ORAL | 30 days supply | Qty: 120 | Fill #0 | Status: AC

## 2021-04-12 ENCOUNTER — Encounter (HOSPITAL_COMMUNITY): Payer: Self-pay

## 2021-04-12 ENCOUNTER — Ambulatory Visit: Payer: 59 | Admitting: Family Medicine

## 2021-04-12 ENCOUNTER — Other Ambulatory Visit: Payer: Self-pay

## 2021-04-12 ENCOUNTER — Ambulatory Visit (INDEPENDENT_AMBULATORY_CARE_PROVIDER_SITE_OTHER): Payer: 59

## 2021-04-12 ENCOUNTER — Ambulatory Visit (HOSPITAL_COMMUNITY)
Admission: EM | Admit: 2021-04-12 | Discharge: 2021-04-12 | Disposition: A | Discharge status: Home / Self Care | Payer: 59 | Attending: Emergency Medicine | Admitting: Emergency Medicine

## 2021-04-12 DIAGNOSIS — S63502A Unspecified sprain of left wrist, initial encounter: Secondary | ICD-10-CM

## 2021-04-12 DIAGNOSIS — M25532 Pain in left wrist: Secondary | ICD-10-CM

## 2021-04-12 MED ORDER — ACETAMINOPHEN 325 MG PO TABS
ORAL_TABLET | ORAL | Status: AC
  Filled 2021-04-12: qty 1

## 2021-04-12 MED ORDER — ACETAMINOPHEN 325 MG PO TABS
975.0000 mg | ORAL_TABLET | Freq: Once | ORAL | Status: AC
  Administered 2021-04-12: 975 mg via ORAL

## 2021-04-12 MED ORDER — ACETAMINOPHEN 325 MG PO TABS
ORAL_TABLET | ORAL | Status: AC
  Filled 2021-04-12: qty 2
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
## 2021-04-12 NOTE — Discharge Instructions (Signed)
You can take Ibuprofen or Naproxen and Tylenol as needed for pain relief.    RICE: Rest as much as possible Ice for 10-15 minutes every 4-6 hours as needed for pain and swelling Compression- use an ace bandage or splint for comfort Elevate above your heart when sitting and standing  Follow up with sports medicine or orthopedics if symptoms do not improve in the next 10-14 days.
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
## 2021-04-13 ENCOUNTER — Other Ambulatory Visit (HOSPITAL_COMMUNITY): Payer: Self-pay

## 2021-04-24 ENCOUNTER — Encounter: Payer: Self-pay | Admitting: Family Medicine
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
## 2021-05-04 ENCOUNTER — Encounter: Payer: Self-pay | Admitting: Family Medicine

## 2021-05-04 ENCOUNTER — Ambulatory Visit (INDEPENDENT_AMBULATORY_CARE_PROVIDER_SITE_OTHER): Payer: 59

## 2021-05-04 ENCOUNTER — Other Ambulatory Visit: Payer: Self-pay

## 2021-05-04 ENCOUNTER — Ambulatory Visit: Payer: 59 | Admitting: Family Medicine

## 2021-05-04 ENCOUNTER — Ambulatory Visit: Payer: Self-pay

## 2021-05-04 VITALS — BP 128/88 | HR 68 | Ht 67.0 in | Wt 211.0 lb

## 2021-05-04 DIAGNOSIS — M7712 Lateral epicondylitis, left elbow: Secondary | ICD-10-CM

## 2021-05-04 DIAGNOSIS — M25532 Pain in left wrist: Secondary | ICD-10-CM

## 2021-05-04 DIAGNOSIS — S6992XA Unspecified injury of left wrist, hand and finger(s), initial encounter: Secondary | ICD-10-CM

## 2021-05-04 DIAGNOSIS — M7711 Lateral epicondylitis, right elbow: Secondary | ICD-10-CM | POA: Diagnosis not present

## 2021-05-04 NOTE — Assessment & Plan Note (Signed)
Discussed with patient about the left wrist injury.  On ultrasound patient's scaphoid bone did not have any significant findings but patient does have some mild overlying hypoechoic changes.  Patient also has what to be a large hematoma with potential for a mild bone contusion over the distal radius.  Discussed icing, bracing, topical anti-inflammatories and then heat and ice.  We discussed that this may take a week and a month or 2 to fully resolve.  Follow-up again in 4 to 6 weeks repeat x-ray has been ordered to further evaluate the scaphoid.

## 2021-05-04 NOTE — Patient Instructions (Signed)
Good to see you Xray wrist Elbows look fantastic Heat and massage on the wrist as well as wear the wrist brace day and night for the next 10 days Ok to increase activity with the elbows See me again in 5-6 weeks

## 2021-05-04 NOTE — Assessment & Plan Note (Signed)
Patient is in bilateral lateral epicondylitis seems to be significantly improved at this time.  No significant hypoechoic changes.  No significant increase in any type of tearing.  Patient is making progress.  We will continue to increase activity as tolerated.  Follow-up again in 4 to 6 weeks.

## 2021-05-05 ENCOUNTER — Other Ambulatory Visit (HOSPITAL_COMMUNITY): Payer: Self-pay

## 2021-05-05 MED FILL — Fluoxetine HCl Cap 20 MG: ORAL | 90 days supply | Qty: 180 | Fill #0 | Status: AC

## 2021-05-23 ENCOUNTER — Ambulatory Visit: Payer: 59 | Admitting: Family Medicine

## 2021-05-29 ENCOUNTER — Other Ambulatory Visit (HOSPITAL_COMMUNITY): Payer: Self-pay

## 2021-05-29 MED FILL — Pitavastatin Calcium Tab 2 MG: ORAL | 90 days supply | Qty: 90 | Fill #0 | Status: AC

## 2021-06-12 ENCOUNTER — Other Ambulatory Visit (HOSPITAL_COMMUNITY): Payer: Self-pay

## 2021-06-12 MED FILL — Omega-3-acid Ethyl Esters Cap 1 GM: ORAL | 30 days supply | Qty: 120 | Fill #1 | Status: AC
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
## 2021-06-15 ENCOUNTER — Ambulatory Visit: Payer: 59 | Admitting: Family Medicine

## 2021-06-15 ENCOUNTER — Encounter: Payer: Self-pay | Admitting: Family Medicine

## 2021-06-15 ENCOUNTER — Ambulatory Visit: Payer: Self-pay

## 2021-06-15 ENCOUNTER — Other Ambulatory Visit: Payer: Self-pay

## 2021-06-15 VITALS — BP 124/80 | HR 58 | Ht 67.0 in | Wt 213.0 lb

## 2021-06-15 DIAGNOSIS — G5632 Lesion of radial nerve, left upper limb: Secondary | ICD-10-CM | POA: Diagnosis not present

## 2021-06-15 DIAGNOSIS — M7712 Lateral epicondylitis, left elbow: Secondary | ICD-10-CM

## 2021-06-15 DIAGNOSIS — M79602 Pain in left arm: Secondary | ICD-10-CM | POA: Diagnosis not present

## 2021-06-15 DIAGNOSIS — G5631 Lesion of radial nerve, right upper limb: Secondary | ICD-10-CM | POA: Insufficient documentation

## 2021-06-15 NOTE — Assessment & Plan Note (Signed)
Injection given today, tolerated procedure well, discussed icing regimen and home exercises.  Discussed potential compression.  Patient ultrasound though does not have any findings that is consistent with anything such as the lateral epicondylitis at this time To be well-healed.  Follow-up again 6 to 8 weeks

## 2021-06-15 NOTE — Assessment & Plan Note (Signed)
Improved overall.  He seemed to have more of a left radial nerve entrapment but could have a contributing.  Attempted injection today and hopefully will be beneficial.  Warned of potential side effects such as differential any type of infectious etiology.  Increase activity slowly.  Discussed icing regimen and home exercises.  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  Follow-up with me again in 6 to 8 weeks

## 2021-06-15 NOTE — Patient Instructions (Addendum)
Good to see you  Injection given today Continue the wrist exercises starting back up days See me again in 6-8 weeks

## 2021-07-04 ENCOUNTER — Other Ambulatory Visit (HOSPITAL_COMMUNITY): Payer: Self-pay
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
## 2021-08-03 ENCOUNTER — Encounter: Payer: Self-pay | Admitting: Family Medicine

## 2021-08-03 ENCOUNTER — Ambulatory Visit: Payer: 59 | Admitting: Family Medicine

## 2021-08-03 ENCOUNTER — Other Ambulatory Visit: Payer: Self-pay | Admitting: Internal Medicine

## 2021-08-03 ENCOUNTER — Other Ambulatory Visit: Payer: Self-pay

## 2021-08-03 ENCOUNTER — Ambulatory Visit: Payer: Self-pay

## 2021-08-03 ENCOUNTER — Other Ambulatory Visit (HOSPITAL_COMMUNITY): Payer: Self-pay

## 2021-08-03 VITALS — BP 130/82 | HR 65 | Ht 67.0 in | Wt 210.0 lb

## 2021-08-03 DIAGNOSIS — M9904 Segmental and somatic dysfunction of sacral region: Secondary | ICD-10-CM

## 2021-08-03 DIAGNOSIS — M9902 Segmental and somatic dysfunction of thoracic region: Secondary | ICD-10-CM | POA: Diagnosis not present

## 2021-08-03 DIAGNOSIS — M545 Low back pain, unspecified: Secondary | ICD-10-CM | POA: Diagnosis not present

## 2021-08-03 DIAGNOSIS — G5632 Lesion of radial nerve, left upper limb: Secondary | ICD-10-CM | POA: Diagnosis not present

## 2021-08-03 DIAGNOSIS — M79602 Pain in left arm: Secondary | ICD-10-CM

## 2021-08-03 DIAGNOSIS — F39 Unspecified mood [affective] disorder: Secondary | ICD-10-CM

## 2021-08-03 DIAGNOSIS — M9903 Segmental and somatic dysfunction of lumbar region: Secondary | ICD-10-CM | POA: Diagnosis not present

## 2021-08-03 DIAGNOSIS — G8929 Other chronic pain: Secondary | ICD-10-CM | POA: Diagnosis not present

## 2021-08-03 MED ORDER — FLUOXETINE HCL 20 MG PO CAPS
40.0000 mg | ORAL_CAPSULE | Freq: Every day | ORAL | 0 refills | Status: DC
Start: 2021-08-03 — End: 2021-11-03
  Filled 2021-08-03: qty 180, 90d supply, fill #0

## 2021-08-03 MED FILL — Omega-3-acid Ethyl Esters Cap 1 GM: ORAL | 30 days supply | Qty: 120 | Fill #2 | Status: AC

## 2021-08-03 NOTE — Patient Instructions (Addendum)
Good to see you  Try an elbow compression with padding when carrying wood  Arnica lotion may help with discomfort Regarding back pain see me in 6-8 weeks for manipulation.

## 2021-08-03 NOTE — Assessment & Plan Note (Signed)
   Decision today to treat with OMT was based on Physical Exam  After verbal consent patient was treated with HVLA, ME, FPR techniques in cervical, thoracic,  lumbar and sacral areas, all areas are chronic   Patient tolerated the procedure well with improvement in symptoms  Patient given exercises, stretches and lifestyle modifications  See medications in patient instructions if given  Patient will follow up in 4-8 weeks 

## 2021-08-03 NOTE — Assessment & Plan Note (Signed)
Significant improvement at this time.  Discussed with patient to continue to stay active otherwise.  Patient was having some mild upper arm pain and discussed potentially more of a compression sleeve will be more beneficial.  Follow-up with me again 6 to 8 weeks for more of his low back pain

## 2021-08-03 NOTE — Assessment & Plan Note (Signed)
Low back pain seems to be multifactorial.  Patient does do a lot of manual lifting that I think does cause some exacerbation.  Discussed home exercises and icing regimen.  Increase activity slowly.  Follow-up with me again in 6 to 8 weeks.

## 2021-08-25 ENCOUNTER — Other Ambulatory Visit (HOSPITAL_COMMUNITY): Payer: Self-pay

## 2021-08-25 MED FILL — Sumatriptan Succinate Tab 100 MG: ORAL | 30 days supply | Qty: 9 | Fill #0 | Status: AC

## 2021-08-28 ENCOUNTER — Ambulatory Visit: Payer: 59 | Admitting: Internal Medicine

## 2021-08-29 ENCOUNTER — Other Ambulatory Visit: Payer: Self-pay | Admitting: Internal Medicine

## 2021-08-29 ENCOUNTER — Other Ambulatory Visit (HOSPITAL_COMMUNITY): Payer: Self-pay

## 2021-08-29 MED ORDER — LIVALO 2 MG PO TABS
1.0000 | ORAL_TABLET | Freq: Every day | ORAL | 1 refills | Status: DC
  Filled 2021-08-29: qty 90, 90d supply, fill #0
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
## 2021-09-26 ENCOUNTER — Ambulatory Visit: Payer: 59 | Admitting: Family Medicine

## 2021-09-26 ENCOUNTER — Ambulatory Visit: Payer: Self-pay

## 2021-09-26 ENCOUNTER — Encounter: Payer: Self-pay | Admitting: Family Medicine

## 2021-09-26 ENCOUNTER — Other Ambulatory Visit: Payer: Self-pay

## 2021-09-26 VITALS — BP 116/80 | HR 63 | Ht 67.0 in | Wt 212.0 lb

## 2021-09-26 DIAGNOSIS — M25532 Pain in left wrist: Secondary | ICD-10-CM

## 2021-09-26 DIAGNOSIS — G8929 Other chronic pain: Secondary | ICD-10-CM | POA: Diagnosis not present

## 2021-09-26 DIAGNOSIS — M9903 Segmental and somatic dysfunction of lumbar region: Secondary | ICD-10-CM | POA: Diagnosis not present

## 2021-09-26 DIAGNOSIS — M9901 Segmental and somatic dysfunction of cervical region: Secondary | ICD-10-CM | POA: Diagnosis not present

## 2021-09-26 DIAGNOSIS — M9908 Segmental and somatic dysfunction of rib cage: Secondary | ICD-10-CM | POA: Diagnosis not present

## 2021-09-26 DIAGNOSIS — S6992XA Unspecified injury of left wrist, hand and finger(s), initial encounter: Secondary | ICD-10-CM

## 2021-09-26 DIAGNOSIS — M9904 Segmental and somatic dysfunction of sacral region: Secondary | ICD-10-CM

## 2021-09-26 DIAGNOSIS — M9902 Segmental and somatic dysfunction of thoracic region: Secondary | ICD-10-CM | POA: Diagnosis not present

## 2021-09-26 DIAGNOSIS — M545 Low back pain, unspecified: Secondary | ICD-10-CM

## 2021-09-26 NOTE — Patient Instructions (Signed)
Buddy tape for one week If redness gets worse call us You are making progress See me again in 8-10 weeks

## 2021-09-26 NOTE — Assessment & Plan Note (Signed)
Chronic problem with mild exacerbation.  Patient is doing relatively well overall.  Discussed icing regimen and home exercises.  Discussed with activity.  Points to avoid.  Increase activity slowly.  Follow-up with me again in 6 to 8 weeks.

## 2021-09-26 NOTE — Assessment & Plan Note (Signed)
Patient's for finger was compressed.  Seems to be more embedded has an abrasion than truly any type of bone abnormality.  Discussed buddy taping at the moment.  Discussed that if any redness occurs with patient's not cleaning it right away that we do need to consider the possibility of treatment for antibiotics but I think this is low likelihood we are looking at it at the moment.  Patient otherwise will follow-up with me again in 6 to 8 weeks.

## 2021-10-03 ENCOUNTER — Other Ambulatory Visit (HOSPITAL_COMMUNITY): Payer: Self-pay

## 2021-10-03 ENCOUNTER — Other Ambulatory Visit: Payer: Self-pay | Admitting: Internal Medicine

## 2021-10-04 ENCOUNTER — Encounter: Payer: Self-pay | Admitting: Internal Medicine

## 2021-10-11 ENCOUNTER — Other Ambulatory Visit (HOSPITAL_COMMUNITY): Payer: Self-pay

## 2021-10-11 ENCOUNTER — Encounter: Payer: Self-pay | Admitting: Internal Medicine

## 2021-10-11 ENCOUNTER — Ambulatory Visit: Payer: 59 | Admitting: Internal Medicine

## 2021-10-11 ENCOUNTER — Emergency Department (HOSPITAL_COMMUNITY)
Admission: EM | Admit: 2021-10-11 | Discharge: 2021-10-12 | Disposition: A | Discharge status: Home / Self Care | Payer: 59 | Attending: Emergency Medicine | Admitting: Emergency Medicine

## 2021-10-11 ENCOUNTER — Other Ambulatory Visit: Payer: Self-pay

## 2021-10-11 VITALS — BP 118/78 | HR 62 | Temp 98.0°F | Ht 67.0 in | Wt 210.0 lb

## 2021-10-11 DIAGNOSIS — N200 Calculus of kidney: Secondary | ICD-10-CM | POA: Diagnosis not present

## 2021-10-11 DIAGNOSIS — E118 Type 2 diabetes mellitus with unspecified complications: Secondary | ICD-10-CM | POA: Diagnosis not present

## 2021-10-11 DIAGNOSIS — R7303 Prediabetes: Secondary | ICD-10-CM | POA: Diagnosis not present

## 2021-10-11 DIAGNOSIS — Z23 Encounter for immunization: Secondary | ICD-10-CM | POA: Diagnosis not present

## 2021-10-11 DIAGNOSIS — E781 Pure hyperglyceridemia: Secondary | ICD-10-CM | POA: Diagnosis not present

## 2021-10-11 DIAGNOSIS — D539 Nutritional anemia, unspecified: Secondary | ICD-10-CM

## 2021-10-11 DIAGNOSIS — K429 Umbilical hernia without obstruction or gangrene: Secondary | ICD-10-CM | POA: Diagnosis not present

## 2021-10-11 DIAGNOSIS — R1030 Lower abdominal pain, unspecified: Secondary | ICD-10-CM | POA: Diagnosis not present

## 2021-10-11 DIAGNOSIS — R1084 Generalized abdominal pain: Secondary | ICD-10-CM | POA: Diagnosis not present

## 2021-10-11 DIAGNOSIS — G43409 Hemiplegic migraine, not intractable, without status migrainosus: Secondary | ICD-10-CM

## 2021-10-11 DIAGNOSIS — N23 Unspecified renal colic: Secondary | ICD-10-CM

## 2021-10-11 DIAGNOSIS — K219 Gastro-esophageal reflux disease without esophagitis: Secondary | ICD-10-CM | POA: Diagnosis not present

## 2021-10-11 DIAGNOSIS — E785 Hyperlipidemia, unspecified: Secondary | ICD-10-CM

## 2021-10-11 DIAGNOSIS — R110 Nausea: Secondary | ICD-10-CM | POA: Diagnosis not present

## 2021-10-11 DIAGNOSIS — N132 Hydronephrosis with renal and ureteral calculous obstruction: Secondary | ICD-10-CM | POA: Diagnosis not present

## 2021-10-11 DIAGNOSIS — E119 Type 2 diabetes mellitus without complications: Secondary | ICD-10-CM | POA: Insufficient documentation

## 2021-10-11 DIAGNOSIS — K4020 Bilateral inguinal hernia, without obstruction or gangrene, not specified as recurrent: Secondary | ICD-10-CM | POA: Diagnosis not present

## 2021-10-11 DIAGNOSIS — R1032 Left lower quadrant pain: Secondary | ICD-10-CM | POA: Diagnosis present

## 2021-10-11 LAB — URINALYSIS, ROUTINE W REFLEX MICROSCOPIC
Bacteria, UA: NONE SEEN
Bilirubin Urine: NEGATIVE
Glucose, UA: NEGATIVE mg/dL
Ketones, ur: NEGATIVE mg/dL
Leukocytes,Ua: NEGATIVE
Nitrite: NEGATIVE
Protein, ur: NEGATIVE mg/dL
Specific Gravity, Urine: 1.023 (ref 1.005–1.030)
pH: 5 (ref 5.0–8.0)

## 2021-10-11 LAB — HEPATIC FUNCTION PANEL
ALT: 34 U/L (ref 0–53)
AST: 30 U/L (ref 0–37)
Albumin: 4.6 g/dL (ref 3.5–5.2)
Alkaline Phosphatase: 43 U/L (ref 39–117)
Bilirubin, Direct: 0.1 mg/dL (ref 0.0–0.3)
Total Bilirubin: 0.4 mg/dL (ref 0.2–1.2)
Total Protein: 7.2 g/dL (ref 6.0–8.3)

## 2021-10-11 LAB — BASIC METABOLIC PANEL
BUN: 14 mg/dL (ref 6–23)
CO2: 24 mEq/L (ref 19–32)
Calcium: 9.4 mg/dL (ref 8.4–10.5)
Chloride: 109 mEq/L (ref 96–112)
Creatinine, Ser: 0.69 mg/dL (ref 0.40–1.50)
GFR: 114.38 mL/min (ref >60.00)
Glucose, Bld: 107 mg/dL — ABNORMAL HIGH (ref 70–99)
Potassium: 3.7 mEq/L (ref 3.5–5.1)
Sodium: 142 mEq/L (ref 135–145)

## 2021-10-11 LAB — COMPREHENSIVE METABOLIC PANEL
ALT: 39 U/L (ref 0–44)
AST: 42 U/L — ABNORMAL HIGH (ref 15–41)
Albumin: 4.2 g/dL (ref 3.5–5.0)
Alkaline Phosphatase: 41 U/L (ref 38–126)
Anion gap: 9 (ref 5–15)
BUN: 15 mg/dL (ref 6–20)
CO2: 19 mmol/L — ABNORMAL LOW (ref 22–32)
Calcium: 9.6 mg/dL (ref 8.9–10.3)
Chloride: 111 mmol/L (ref 98–111)
Creatinine, Ser: 1.1 mg/dL (ref 0.61–1.24)
GFR, Estimated: >60 mL/min (ref >60)
Glucose, Bld: 125 mg/dL — ABNORMAL HIGH (ref 70–99)
Potassium: 3.8 mmol/L (ref 3.5–5.1)
Sodium: 139 mmol/L (ref 135–145)
Total Bilirubin: 0.5 mg/dL (ref 0.3–1.2)
Total Protein: 7.1 g/dL (ref 6.5–8.1)

## 2021-10-11 LAB — IBC + FERRITIN
Ferritin: 186.7 ng/mL (ref 22.0–322.0)
Iron: 85 ug/dL (ref 42–165)
Saturation Ratios: 18 % — ABNORMAL LOW (ref 20.0–50.0)
TIBC: 473.2 ug/dL — ABNORMAL HIGH (ref 250.0–450.0)
Transferrin: 338 mg/dL (ref 212.0–360.0)

## 2021-10-11 LAB — CBC
HCT: 36.1 % — ABNORMAL LOW (ref 39.0–52.0)
Hemoglobin: 12.3 g/dL — ABNORMAL LOW (ref 13.0–17.0)
MCH: 30.2 pg (ref 26.0–34.0)
MCHC: 34.1 g/dL (ref 30.0–36.0)
MCV: 88.7 fL (ref 80.0–100.0)
Platelets: 320 10*3/uL (ref 150–400)
RBC: 4.07 MIL/uL — ABNORMAL LOW (ref 4.22–5.81)
RDW: 12.3 % (ref 11.5–15.5)
WBC: 12 10*3/uL — ABNORMAL HIGH (ref 4.0–10.5)
nRBC: 0 % (ref 0.0–0.2)

## 2021-10-11 LAB — HEMOGLOBIN A1C: Hgb A1c MFr Bld: 6.6 % — ABNORMAL HIGH (ref 4.6–6.5)

## 2021-10-11 LAB — VITAMIN B12: Vitamin B-12: 367 pg/mL (ref 211–911)

## 2021-10-11 LAB — LIPASE, BLOOD: Lipase: 31 U/L (ref 11–51)

## 2021-10-11 LAB — FOLATE: Folate: >23.4 ng/mL (ref >5.9)

## 2021-10-11 LAB — CK: Total CK: 81 U/L (ref 7–232)

## 2021-10-11 MED ORDER — LIVALO 2 MG PO TABS
1.0000 | ORAL_TABLET | Freq: Every day | ORAL | 1 refills | Status: DC
Start: 2021-10-11 — End: 2022-05-28
  Filled 2021-10-11 – 2021-11-27 (×2): qty 90, 90d supply, fill #0
  Filled 2022-02-27: qty 90, 90d supply, fill #1

## 2021-10-11 MED ORDER — QULIPTA 60 MG PO TABS: 1.0000 | ORAL_TABLET | Freq: Every day | ORAL | 1 refills | Status: DC

## 2021-10-11 MED ORDER — ONDANSETRON 4 MG PO TBDP
4.0000 mg | ORAL_TABLET | Freq: Once | ORAL | Status: AC
  Administered 2021-10-11: 4 mg via ORAL
  Filled 2021-10-11: qty 1

## 2021-10-11 MED ORDER — MOUNJARO 2.5 MG/0.5ML ~~LOC~~ SOAJ
2.5000 mg | SUBCUTANEOUS | 0 refills | Status: DC
  Filled 2021-10-11: qty 2, 28d supply, fill #0

## 2021-10-11 MED ORDER — FENOFIBRATE 160 MG PO TABS
160.0000 mg | ORAL_TABLET | Freq: Every day | ORAL | 1 refills | Status: DC
  Filled 2021-10-11: qty 90, 90d supply, fill #0
  Filled 2022-01-03: qty 90, 90d supply, fill #1

## 2021-10-11 MED ORDER — ACETAMINOPHEN 325 MG PO TABS
650.0000 mg | ORAL_TABLET | Freq: Once | ORAL | Status: AC
  Administered 2021-10-11: 650 mg via ORAL
  Filled 2021-10-11: qty 2
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
## 2021-10-11 NOTE — ED Triage Notes (Signed)
EMS report: pt from home c/o LLQ abdominal pain, described as stabbing, pain improved with pressure. Ibuprofen and muscle relaxer no longer improving pain. C/o pain w/ urination x1 week. No GI Hx. 20G LAC, VE 148/86 HR 74, SpO2 100%, CBG 144

## 2021-10-11 NOTE — ED Provider Notes (Signed)
Emergency Medicine Provider Triage Evaluation Note  ERMIAS Jason Phillips , a 40 y.o. male  was evaluated in triage.  Pt complains of abdominal pain x2 days.  Pain was initially relieved with Flexeril and ibuprofen.  Pain returned today at about 4:30 PM, has been unrelieved by medications.  Patient describes the pain as stabbing and discomfort.  Was recently treated for urinary tract infection with Bactrim last week.  Review of Systems  Positive: Abdominal pain, nausea, difficulty urinating Negative: Fevers, chills, chest pain, shortness of breath, diarrhea, vomiting, hematuria, dysuria  Physical Exam  BP (!) 135/108 (BP Location: Right Arm)   Pulse 63   Temp 98.5 F (36.9 C) (Oral)   Resp 20   Ht 5\' 7"  (1.702 m)   Wt 95.3 kg   SpO2 100%   BMI 32.89 kg/m  Gen:   Awake, no distress   Resp:  Normal effort  MSK:   Moves extremities without difficulty  Other:  Left lower quadrant tenderness to palpation, no CVA tenderness  Medical Decision Making  Medically screening exam initiated at 9:19 PM.  Appropriate orders placed.  Jason Phillips was informed that the remainder of the evaluation will be completed by another provider, this initial triage assessment does not replace that evaluation, and the importance of remaining in the ED until their evaluation is complete.     Jason Phillips 10/11/21 2121    2122, MD 10/11/21 [phone]

## 2021-10-11 NOTE — Patient Instructions (Signed)
Anemia Anemia is a condition in which there is not enough red blood cells or hemoglobin in the blood. Hemoglobin is a substance in red blood cells that carries oxygen. When you do not have enough red blood cells or hemoglobin (are anemic), your body cannot get enough oxygen and your organs may not work properly. As a result, you may feel very tired or have other problems. What are the causes? Common causes of anemia include: Excessive bleeding. Anemia can be caused by excessive bleeding inside or outside the body, including bleeding from the intestines or from heavy menstrual periods in females. Poor nutrition. Long-lasting (chronic) kidney, thyroid, and liver disease. Bone marrow disorders, spleen problems, and blood disorders. Cancer and treatments for cancer. HIV (human immunodeficiency virus) and AIDS (acquired immunodeficiency syndrome). Infections, medicines, and autoimmune disorders that destroy red blood cells. What are the signs or symptoms? Symptoms of this condition include: Minor weakness. Dizziness. Headache, or difficulties concentrating and sleeping. Heartbeats that feel irregular or faster than normal (palpitations). Shortness of breath, especially with exercise. Pale skin, lips, and nails, or cold hands and feet. Indigestion and nausea. Symptoms may occur suddenly or develop slowly. If your anemia is mild, you may not have symptoms. How is this diagnosed? This condition is diagnosed based on blood tests, your medical history, and a physical exam. In some cases, a test may be needed in which cells are removed from the soft tissue inside of a bone and looked at under a microscope (bone marrow biopsy). Your health care provider may also check your stool (feces) for blood and may do additional testing to look for the cause of your bleeding. Other tests may include: Imaging tests, such as a CT scan or MRI. A procedure to see inside your esophagus and stomach (endoscopy). A  procedure to see inside your colon and rectum (colonoscopy). How is this treated? Treatment for this condition depends on the cause. If you continue to lose a lot of blood, you may need to be treated at a hospital. Treatment may include: Taking supplements of iron, vitamin B12, or folic acid. Taking a hormone medicine (erythropoietin) that can help to stimulate red blood cell growth. Having a blood transfusion. This may be needed if you lose a lot of blood. Making changes to your diet. Having surgery to remove your spleen. Follow these instructions at home: Take over-the-counter and prescription medicines only as told by your health care provider. Take supplements only as told by your health care provider. Follow any diet instructions that you were given by your health care provider. Keep all follow-up visits as told by your health care provider. This is important. Contact a health care provider if: You develop new bleeding anywhere in the body. Get help right away if: You are very weak. You are short of breath. You have pain in your abdomen or chest. You are dizzy or feel faint. You have trouble concentrating. You have bloody stools, black stools, or tarry stools. You vomit repeatedly or you vomit up blood. These symptoms may represent a serious problem that is an emergency. Do not wait to see if the symptoms will go away. Get medical help right away. Call your local emergency services (911 in the [place] Do not drive yourself to the hospital. Summary Anemia is a condition in which you do not have enough red blood cells or enough of a substance in your red blood cells that carries oxygen (hemoglobin). Symptoms may occur suddenly or develop slowly. If your anemia is   mild, you may not have symptoms. This condition is diagnosed with blood tests, a medical history, and a physical exam. Other tests may be needed. Treatment for this condition depends on the cause of the anemia. This  information is not intended to replace advice given to you by your health care provider. Make sure you discuss any questions you have with your health care provider. Document Revised: 11/10/2019 Document Reviewed: 11/10/2019 Elsevier Patient Education  2022 [employer]  

## 2021-10-11 NOTE — ED Triage Notes (Signed)
Pt c/o sudden onset of LLQ abdominal pain x2 days. Pain was relieved with flexeril and ibuprofen. Pt has returned and unrelieved by medications.

## 2021-10-12 ENCOUNTER — Emergency Department (HOSPITAL_COMMUNITY): Payer: 59

## 2021-10-12 ENCOUNTER — Other Ambulatory Visit: Payer: Self-pay | Admitting: Internal Medicine

## 2021-10-12 ENCOUNTER — Other Ambulatory Visit (HOSPITAL_COMMUNITY): Payer: Self-pay

## 2021-10-12 DIAGNOSIS — K429 Umbilical hernia without obstruction or gangrene: Secondary | ICD-10-CM | POA: Diagnosis not present

## 2021-10-12 DIAGNOSIS — K4020 Bilateral inguinal hernia, without obstruction or gangrene, not specified as recurrent: Secondary | ICD-10-CM | POA: Diagnosis not present

## 2021-10-12 DIAGNOSIS — N132 Hydronephrosis with renal and ureteral calculous obstruction: Secondary | ICD-10-CM | POA: Diagnosis not present

## 2021-10-12 DIAGNOSIS — K219 Gastro-esophageal reflux disease without esophagitis: Secondary | ICD-10-CM | POA: Diagnosis not present

## 2021-10-12 DIAGNOSIS — E119 Type 2 diabetes mellitus without complications: Secondary | ICD-10-CM | POA: Diagnosis not present

## 2021-10-12 DIAGNOSIS — N200 Calculus of kidney: Secondary | ICD-10-CM | POA: Diagnosis not present

## 2021-10-12 MED ORDER — SODIUM CHLORIDE 0.9 % IV BOLUS
1000.0000 mL | Freq: Once | INTRAVENOUS | Status: AC
  Administered 2021-10-12: 1000 mL via INTRAVENOUS

## 2021-10-12 MED ORDER — ONDANSETRON 8 MG PO TBDP
8.0000 mg | ORAL_TABLET | Freq: Three times a day (TID) | ORAL | 0 refills | Status: DC | PRN
Start: 2021-10-12 — End: 2022-01-04
  Filled 2021-10-12: qty 20, 7d supply, fill #0

## 2021-10-12 MED ORDER — SODIUM CHLORIDE 0.9 % IV SOLN
12.5000 mg | Freq: Four times a day (QID) | INTRAVENOUS | Status: DC | PRN
  Administered 2021-10-12: 12.5 mg via INTRAVENOUS
  Filled 2021-10-12: qty 0.5

## 2021-10-12 MED ORDER — FENTANYL CITRATE PF 50 MCG/ML IJ SOSY
50.0000 ug | PREFILLED_SYRINGE | Freq: Once | INTRAMUSCULAR | Status: AC
  Administered 2021-10-12: 50 ug via INTRAVENOUS
  Filled 2021-10-12: qty 1

## 2021-10-12 MED ORDER — ONDANSETRON HCL 4 MG/2ML IJ SOLN: 4.0000 mg | Freq: Once | INTRAMUSCULAR | Status: DC

## 2021-10-12 MED ORDER — KETOROLAC TROMETHAMINE 15 MG/ML IJ SOLN
15.0000 mg | Freq: Once | INTRAMUSCULAR | Status: AC
  Administered 2021-10-12: 15 mg via INTRAVENOUS
  Filled 2021-10-12: qty 1

## 2021-10-12 MED ORDER — OXYCODONE-ACETAMINOPHEN 5-325 MG PO TABS
1.0000 | ORAL_TABLET | Freq: Four times a day (QID) | ORAL | 0 refills | Status: DC | PRN
  Filled 2021-10-12: qty 15, 4d supply, fill #0

## 2021-10-12 MED ORDER — TAMSULOSIN HCL 0.4 MG PO CAPS
0.4000 mg | ORAL_CAPSULE | Freq: Every day | ORAL | 0 refills | Status: DC
  Filled 2021-10-12: qty 30, 30d supply, fill #0
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
## 2021-10-12 NOTE — ED Notes (Signed)
Patient transported to CT 

## 2021-10-12 NOTE — ED Notes (Signed)
Got patient into a gown on the monitor patient is resting with call bell in reach and family at bedside ?

## 2021-10-13 ENCOUNTER — Other Ambulatory Visit (HOSPITAL_COMMUNITY): Payer: Self-pay

## 2021-10-13 ENCOUNTER — Other Ambulatory Visit: Payer: Self-pay | Admitting: Internal Medicine

## 2021-10-13 MED ORDER — OMEGA-3-ACID ETHYL ESTERS 1 G PO CAPS
2.0000 | ORAL_CAPSULE | Freq: Two times a day (BID) | ORAL | 6 refills | Status: DC
  Filled 2021-10-13: qty 120, 30d supply, fill #0
  Filled 2021-12-14: qty 120, 30d supply, fill #1
  Filled 2022-02-12: qty 120, 30d supply, fill #2
  Filled 2022-04-18: qty 120, 30d supply, fill #3
  Filled 2022-06-22: qty 120, 30d supply, fill #4
  Filled 2022-08-23: qty 120, 30d supply, fill #5

## 2021-10-16 ENCOUNTER — Other Ambulatory Visit (HOSPITAL_COMMUNITY): Payer: Self-pay

## 2021-10-16 ENCOUNTER — Other Ambulatory Visit: Payer: Self-pay | Admitting: Internal Medicine

## 2021-10-16 ENCOUNTER — Encounter: Payer: Self-pay | Admitting: Internal Medicine

## 2021-10-16 LAB — VITAMIN B1: Vitamin B1 (Thiamine): 21 nmol/L (ref 8–30)

## 2021-10-16 MED ORDER — SUMATRIPTAN SUCCINATE 100 MG PO TABS
50.0000 mg | ORAL_TABLET | ORAL | 1 refills | Status: DC | PRN
  Filled 2021-10-16: qty 9, 30d supply, fill #0

## 2021-10-17 ENCOUNTER — Ambulatory Visit: Payer: 59 | Admitting: Family Medicine

## 2021-10-27 DIAGNOSIS — N202 Calculus of kidney with calculus of ureter: Secondary | ICD-10-CM | POA: Diagnosis not present

## 2021-11-03 ENCOUNTER — Other Ambulatory Visit (HOSPITAL_COMMUNITY): Payer: Self-pay

## 2021-11-03 ENCOUNTER — Other Ambulatory Visit: Payer: Self-pay | Admitting: Internal Medicine

## 2021-11-03 DIAGNOSIS — F39 Unspecified mood [affective] disorder: Secondary | ICD-10-CM

## 2021-11-03 MED ORDER — FLUOXETINE HCL 20 MG PO CAPS
40.0000 mg | ORAL_CAPSULE | Freq: Every day | ORAL | 0 refills | Status: DC
Start: 2021-11-03 — End: 2022-02-01
  Filled 2021-11-03: qty 180, 90d supply, fill #0

## 2021-11-23 NOTE — Progress Notes (Signed)
[name] [address] [place] [address] Phone: [phone] Subjective:   Jason Phillips, am serving as a scribe for Dr. Antoine Primas. This visit occurred during the SARS-CoV-2 public health emergency.  Safety protocols were in place, including screening questions prior to the visit, additional usage of staff PPE, and extensive cleaning of exam room while observing appropriate contact time as indicated for disinfecting solutions.   I'm seeing this patient by the request  of:  Etta Grandchild, MD  CC: Back pain  BJS:[id number]  09/26/2021 Patient's for finger was compressed.  Seems to be more embedded has an abrasion than truly any type of bone abnormality.  Discussed buddy taping at the moment.  Discussed that if any redness occurs with patient's not cleaning it right away that we do need to consider the possibility of treatment for antibiotics but I think this is low likelihood we are looking at it at the moment.  Patient otherwise will follow-up with me again in 6 to 8 weeks.  Chronic problem with mild exacerbation.  Patient is doing relatively well overall.  Discussed icing regimen and home exercises.  Discussed with activity.  Points to avoid.  Increase activity slowly.  Follow-up with me again in 6 to 8 weeks.  Update 11/28/2021 RAHEEL KUNKLE is a 40 y.o. male coming in with complaint of back and neck pain. OMT 09/26/2021. Also f/u for L wrist and L finger pain. Patient states that his back pain has increased lately.   Also having L elbow pain over lateral epicondyle constantly. Using IBU for pain relief. Pain worse from working with wood. Wearing elbow sleeve during work.   Medications patient has been prescribed: None  Taking:         Reviewed prior external information including notes and imaging from previsou exam, outside providers and external EMR if available.   As well as notes that were available from care everywhere  and other healthcare systems.  Past medical history, social, surgical and family history all reviewed in electronic medical record.  No pertanent information unless stated regarding to the chief complaint.   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 POSITIVE muscle aches  Objective  Blood pressure 112/80, pulse 74, height 5\' 7"  (1.702 m), weight 210 lb (95.3 kg), SpO2 97 %.   General: No apparent distress alert and oriented x3 mood and affect normal, dressed appropriately.  HEENT: Pupils equal, extraocular movements intact  Respiratory: Patient's speak in full sentences and does not appear short of breath  Cardiovascular: No lower extremity edema, non tender, no erythema  Low back exam does have some loss of lordosis.  Some tenderness to palpation of the paraspinal musculature.  Tightness noted in the parascapular region as well.  Limited sidebending of the neck  bilaterally right greater than left.  Osteopathic findings  C5 flexed rotated and side bent right C6 flexed rotated and side bent left T3 extended rotated and side bent right inhaled rib T9 extended rotated and side bent left L2 flexed rotated and side bent right Sacrum right on right       Assessment and Plan:  Low back pain Continues to have low back pain.  Seems to be a chronic problem with exacerbation.  Patient  does have topical anti-inflammatories.  We discussed other medications which patient wants to avoid at the moment if possible.  Follow-up again in 4 to 8 weeks  Radial nerve entrapment, left Has had difficulty previously as well as lateral epicondylitis.  We will continue to monitor.  Worsening pain do need to consider possible injection   Nonallopathic problems  Decision today to treat with OMT was based on Physical Exam  After verbal consent patient was treated with HVLA, ME, FPR techniques in cervical, rib, thoracic, lumbar, and sacral  areas  Patient tolerated the procedure well with improvement in symptoms  Patient given exercises, stretches and lifestyle modifications  See medications in patient instructions if given  Patient will follow up in 4-8 weeks     The above documentation has been reviewed and is accurate and complete Judi Saa, DO        Note: This dictation was prepared with Dragon dictation along with smaller phrase technology. Any transcriptional errors that result from this process are unintentional.

## 2021-11-27 ENCOUNTER — Other Ambulatory Visit (HOSPITAL_COMMUNITY): Payer: Self-pay

## 2021-11-28 ENCOUNTER — Encounter: Payer: Self-pay | Admitting: Family Medicine

## 2021-11-28 ENCOUNTER — Other Ambulatory Visit: Payer: Self-pay

## 2021-11-28 ENCOUNTER — Ambulatory Visit: Payer: 59 | Admitting: Family Medicine

## 2021-11-28 VITALS — BP 112/80 | HR 74 | Ht 67.0 in | Wt 210.0 lb

## 2021-11-28 DIAGNOSIS — M545 Low back pain, unspecified: Secondary | ICD-10-CM

## 2021-11-28 DIAGNOSIS — M9903 Segmental and somatic dysfunction of lumbar region: Secondary | ICD-10-CM | POA: Diagnosis not present

## 2021-11-28 DIAGNOSIS — M9904 Segmental and somatic dysfunction of sacral region: Secondary | ICD-10-CM

## 2021-11-28 DIAGNOSIS — M9908 Segmental and somatic dysfunction of rib cage: Secondary | ICD-10-CM

## 2021-11-28 DIAGNOSIS — M9902 Segmental and somatic dysfunction of thoracic region: Secondary | ICD-10-CM

## 2021-11-28 DIAGNOSIS — G5632 Lesion of radial nerve, left upper limb: Secondary | ICD-10-CM | POA: Diagnosis not present

## 2021-11-28 DIAGNOSIS — G8929 Other chronic pain: Secondary | ICD-10-CM

## 2021-11-28 DIAGNOSIS — M9901 Segmental and somatic dysfunction of cervical region: Secondary | ICD-10-CM | POA: Diagnosis not present

## 2021-11-28 NOTE — Patient Instructions (Signed)
Good to see you! Try to change left hand position to more thumbs up Good movement with the back today See you again in 6-8 weeks

## 2021-11-28 NOTE — Assessment & Plan Note (Signed)
Has had difficulty previously as well as lateral epicondylitis.  We will continue to monitor.  Worsening pain do need to consider possible injection

## 2021-11-28 NOTE — Assessment & Plan Note (Signed)
Continues to have low back pain.  Seems to be a chronic problem with exacerbation.  Patient does have topical anti-inflammatories.  We discussed other medications which patient wants to avoid at the moment if possible.  Follow-up again in 4 to 8 weeks

## 2021-12-14 ENCOUNTER — Other Ambulatory Visit (HOSPITAL_COMMUNITY): Payer: Self-pay

## 2022-01-03 ENCOUNTER — Other Ambulatory Visit (HOSPITAL_COMMUNITY): Payer: Self-pay

## 2022-01-04 ENCOUNTER — Other Ambulatory Visit (HOSPITAL_COMMUNITY): Payer: Self-pay

## 2022-01-04 ENCOUNTER — Other Ambulatory Visit: Payer: Self-pay | Admitting: Internal Medicine

## 2022-01-04 ENCOUNTER — Encounter: Payer: Self-pay | Admitting: Internal Medicine

## 2022-01-04 DIAGNOSIS — U071 COVID-19: Secondary | ICD-10-CM | POA: Insufficient documentation

## 2022-01-04 MED ORDER — NIRMATRELVIR/RITONAVIR (PAXLOVID)TABLET
3.0000 | ORAL_TABLET | Freq: Two times a day (BID) | ORAL | 0 refills | Status: AC
  Filled 2022-01-04: qty 30, 5d supply, fill #0

## 2022-01-08 ENCOUNTER — Other Ambulatory Visit (HOSPITAL_COMMUNITY): Payer: Self-pay

## 2022-01-08 MED ORDER — CARESTART COVID-19 HOME TEST VI KIT
PACK | 0 refills | Status: DC
  Filled 2022-01-08: qty 4, 4d supply, fill #0

## 2022-01-11 ENCOUNTER — Ambulatory Visit: Payer: 59 | Admitting: Family Medicine

## 2022-01-12 DIAGNOSIS — R1032 Left lower quadrant pain: Secondary | ICD-10-CM | POA: Diagnosis not present

## 2022-01-12 DIAGNOSIS — N200 Calculus of kidney: Secondary | ICD-10-CM | POA: Diagnosis not present

## 2022-01-12 DIAGNOSIS — R1084 Generalized abdominal pain: Secondary | ICD-10-CM | POA: Diagnosis not present

## 2022-01-16 NOTE — Progress Notes (Signed)
[name] [address] [place] [address] Phone: [phone] Subjective:   Jason Phillips, am serving as a scribe for Dr. Antoine Primas.This visit occurred during the SARS-CoV-2 public health emergency.  Safety protocols were in place, including screening questions prior to the visit, additional usage of staff PPE, and extensive cleaning of exam room while observing appropriate contact time as indicated for disinfecting solutions.  I'm seeing this patient by the request  of:  Etta Grandchild, MD  CC: Upper back and lower back pain  OTL:[id number]  Jason Phillips is a 41 y.o. male coming in with complaint of back and neck pain. OMT 11/28/2021. Patient states that he had COVID recently and was lying around a lot. Pain increased in lower back but has been approving. Arm pain is better due to not using his arms.   Medications patient has been prescribed: None  Taking:         Reviewed prior external information including notes and imaging from previsou exam, outside providers and external EMR if available.   As well as notes that were available from care everywhere and other healthcare systems.  Past medical history, social, surgical and family history all reviewed in electronic medical record.  No pertanent information unless stated regarding to the chief complaint.   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 POSITIVE muscle aches  Objective  Blood pressure 124/84, pulse 64, height 5\' 7"  (1.702 m), weight 207 lb (93.9 kg), SpO2 99 %.   General: No apparent distress alert and oriented x3 mood and affect normal, dressed appropriately.  HEENT: Pupils equal, extraocular movements intact  Respiratory: Patient's speak in full sentences and does not appear short of breath  Cardiovascular: No lower extremity edema, non tender, no erythema  Low back exam does have some mild loss of lordosis.  Some tenderness to palpation in the paraspinal musculature.  Osteopathic findings  C2 flexed rotated and side bent right C7 flexed rotated and side bent right T3 extended rotated and side bent right inhaled rib T6 extended rotated and side bent left L2 flexed rotated and side bent right Sacrum right on right       Assessment and Plan:  Low back pain Chronic problem with exacerbation.  Patient has not been quite as active.  Did start having some more aggravation secondary to patient going around from COVID.  Also has had a cough recently.  Did respond extremely well though to osteopathic manipulation.  Follow-up again in 6 to 8 weeks   Nonallopathic problems  Decision today to treat with OMT was based on Physical Exam  After verbal consent patient was treated with HVLA, ME, FPR techniques in cervical, rib, thoracic, lumbar, and sacral  areas  Patient tolerated the procedure well with improvement  in symptoms  Patient given exercises, stretches and lifestyle modifications  See medications in patient instructions if given  Patient will follow up in 4-8 weeks      The above documentation has been reviewed and is accurate and complete Judi Saa, DO       Note: This dictation was prepared with Dragon dictation along with smaller phrase technology. Any transcriptional errors that result from this process are  unintentional.

## 2022-01-17 ENCOUNTER — Other Ambulatory Visit: Payer: Self-pay

## 2022-01-17 ENCOUNTER — Ambulatory Visit: Payer: 59 | Admitting: Family Medicine

## 2022-01-17 VITALS — BP 124/84 | HR 64 | Ht 67.0 in | Wt 207.0 lb

## 2022-01-17 DIAGNOSIS — M9901 Segmental and somatic dysfunction of cervical region: Secondary | ICD-10-CM

## 2022-01-17 DIAGNOSIS — M545 Low back pain, unspecified: Secondary | ICD-10-CM

## 2022-01-17 DIAGNOSIS — M9902 Segmental and somatic dysfunction of thoracic region: Secondary | ICD-10-CM

## 2022-01-17 DIAGNOSIS — M9903 Segmental and somatic dysfunction of lumbar region: Secondary | ICD-10-CM | POA: Diagnosis not present

## 2022-01-17 DIAGNOSIS — G8929 Other chronic pain: Secondary | ICD-10-CM

## 2022-01-17 DIAGNOSIS — M9908 Segmental and somatic dysfunction of rib cage: Secondary | ICD-10-CM | POA: Diagnosis not present

## 2022-01-17 DIAGNOSIS — M9904 Segmental and somatic dysfunction of sacral region: Secondary | ICD-10-CM | POA: Diagnosis not present

## 2022-01-17 NOTE — Assessment & Plan Note (Signed)
Chronic problem with exacerbation.  Patient has not been quite as active.  Did start having some more aggravation secondary to patient going around from COVID.  Also has had a cough recently.  Did respond extremely well though to osteopathic manipulation.  Follow-up again in 6 to 8 weeks

## 2022-01-17 NOTE — Patient Instructions (Signed)
Good to see you Jason Phillips before bed See me in 4 weeks

## 2022-01-31 ENCOUNTER — Other Ambulatory Visit (HOSPITAL_COMMUNITY): Payer: Self-pay

## 2022-01-31 ENCOUNTER — Other Ambulatory Visit: Payer: Self-pay | Admitting: Internal Medicine

## 2022-01-31 MED ORDER — FREESTYLE LITE TEST VI STRP
ORAL_STRIP | 0 refills | Status: DC
  Filled 2022-01-31: qty 100, 25d supply, fill #0

## 2022-02-01 ENCOUNTER — Other Ambulatory Visit: Payer: Self-pay | Admitting: Internal Medicine

## 2022-02-01 ENCOUNTER — Other Ambulatory Visit (HOSPITAL_COMMUNITY): Payer: Self-pay

## 2022-02-01 DIAGNOSIS — F39 Unspecified mood [affective] disorder: Secondary | ICD-10-CM

## 2022-02-01 MED ORDER — FLUOXETINE HCL 20 MG PO CAPS
40.0000 mg | ORAL_CAPSULE | Freq: Every day | ORAL | 0 refills | Status: DC
  Filled 2022-02-01: qty 180, 90d supply, fill #0

## 2022-02-12 ENCOUNTER — Other Ambulatory Visit (HOSPITAL_COMMUNITY): Payer: Self-pay
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
## 2022-02-14 ENCOUNTER — Other Ambulatory Visit: Payer: Self-pay

## 2022-02-14 ENCOUNTER — Ambulatory Visit (INDEPENDENT_AMBULATORY_CARE_PROVIDER_SITE_OTHER): Payer: 59 | Admitting: Family Medicine

## 2022-02-14 VITALS — BP 126/84 | HR 71 | Ht 67.0 in | Wt 209.0 lb

## 2022-02-14 DIAGNOSIS — M9903 Segmental and somatic dysfunction of lumbar region: Secondary | ICD-10-CM

## 2022-02-14 DIAGNOSIS — M9908 Segmental and somatic dysfunction of rib cage: Secondary | ICD-10-CM | POA: Diagnosis not present

## 2022-02-14 DIAGNOSIS — M9904 Segmental and somatic dysfunction of sacral region: Secondary | ICD-10-CM

## 2022-02-14 DIAGNOSIS — M9902 Segmental and somatic dysfunction of thoracic region: Secondary | ICD-10-CM

## 2022-02-14 DIAGNOSIS — M9901 Segmental and somatic dysfunction of cervical region: Secondary | ICD-10-CM

## 2022-02-14 DIAGNOSIS — M545 Low back pain, unspecified: Secondary | ICD-10-CM | POA: Diagnosis not present

## 2022-02-14 DIAGNOSIS — G8929 Other chronic pain: Secondary | ICD-10-CM | POA: Diagnosis not present

## 2022-02-14 NOTE — Patient Instructions (Signed)
Good to see you! ?Do exercises as cool down ?See you again in 6-8 weeks ?
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
## 2022-02-27 ENCOUNTER — Other Ambulatory Visit (HOSPITAL_COMMUNITY): Payer: Self-pay

## 2022-02-28 ENCOUNTER — Other Ambulatory Visit: Payer: Self-pay

## 2022-02-28 ENCOUNTER — Ambulatory Visit (INDEPENDENT_AMBULATORY_CARE_PROVIDER_SITE_OTHER): Payer: 59 | Admitting: Family Medicine

## 2022-02-28 ENCOUNTER — Ambulatory Visit: Payer: Self-pay

## 2022-02-28 ENCOUNTER — Encounter (HOSPITAL_COMMUNITY): Payer: Self-pay

## 2022-02-28 ENCOUNTER — Encounter: Payer: Self-pay | Admitting: Family Medicine

## 2022-02-28 ENCOUNTER — Emergency Department (HOSPITAL_COMMUNITY)
Admission: EM | Admit: 2022-02-28 | Discharge: 2022-02-28 | Disposition: A | Discharge status: Home / Self Care | Payer: 59 | Attending: Emergency Medicine | Admitting: Emergency Medicine

## 2022-02-28 ENCOUNTER — Emergency Department (HOSPITAL_COMMUNITY): Payer: 59

## 2022-02-28 ENCOUNTER — Other Ambulatory Visit (HOSPITAL_COMMUNITY): Payer: Self-pay

## 2022-02-28 VITALS — BP 140/92 | HR 86 | Ht 67.0 in

## 2022-02-28 DIAGNOSIS — Z79899 Other long term (current) drug therapy: Secondary | ICD-10-CM | POA: Diagnosis not present

## 2022-02-28 DIAGNOSIS — M7042 Prepatellar bursitis, left knee: Secondary | ICD-10-CM | POA: Insufficient documentation

## 2022-02-28 DIAGNOSIS — M25462 Effusion, left knee: Secondary | ICD-10-CM | POA: Diagnosis not present

## 2022-02-28 DIAGNOSIS — Z23 Encounter for immunization: Secondary | ICD-10-CM | POA: Diagnosis not present

## 2022-02-28 DIAGNOSIS — L03116 Cellulitis of left lower limb: Secondary | ICD-10-CM | POA: Diagnosis not present

## 2022-02-28 DIAGNOSIS — M7989 Other specified soft tissue disorders: Secondary | ICD-10-CM | POA: Diagnosis not present

## 2022-02-28 DIAGNOSIS — M25562 Pain in left knee: Secondary | ICD-10-CM | POA: Diagnosis not present

## 2022-02-28 DIAGNOSIS — Y9302 Activity, running: Secondary | ICD-10-CM | POA: Insufficient documentation

## 2022-02-28 DIAGNOSIS — Z8616 Personal history of COVID-19: Secondary | ICD-10-CM | POA: Insufficient documentation

## 2022-02-28 DIAGNOSIS — R7303 Prediabetes: Secondary | ICD-10-CM | POA: Diagnosis not present

## 2022-02-28 DIAGNOSIS — M25469 Effusion, unspecified knee: Secondary | ICD-10-CM | POA: Insufficient documentation

## 2022-02-28 LAB — BASIC METABOLIC PANEL
Anion gap: 8 (ref 5–15)
BUN: 13 mg/dL (ref 6–20)
CO2: 21 mmol/L — ABNORMAL LOW (ref 22–32)
Calcium: 9.1 mg/dL (ref 8.9–10.3)
Chloride: 104 mmol/L (ref 98–111)
Creatinine, Ser: 0.68 mg/dL (ref 0.61–1.24)
GFR, Estimated: >60 mL/min (ref >60)
Glucose, Bld: 127 mg/dL — ABNORMAL HIGH (ref 70–99)
Potassium: 3.6 mmol/L (ref 3.5–5.1)
Sodium: 133 mmol/L — ABNORMAL LOW (ref 135–145)

## 2022-02-28 LAB — CBC WITH DIFFERENTIAL/PLATELET
Abs Immature Granulocytes: 0.05 10*3/uL (ref 0.00–0.07)
Basophils Absolute: 0.1 10*3/uL (ref 0.0–0.1)
Basophils Relative: 0 %
Eosinophils Absolute: 0 10*3/uL (ref 0.0–0.5)
Eosinophils Relative: 0 %
HCT: 37.3 % — ABNORMAL LOW (ref 39.0–52.0)
Hemoglobin: 13 g/dL (ref 13.0–17.0)
Immature Granulocytes: 0 %
Lymphocytes Relative: 12 %
Lymphs Abs: 1.7 10*3/uL (ref 0.7–4.0)
MCH: 30.7 pg (ref 26.0–34.0)
MCHC: 34.9 g/dL (ref 30.0–36.0)
MCV: 88.2 fL (ref 80.0–100.0)
Monocytes Absolute: 1.1 10*3/uL — ABNORMAL HIGH (ref 0.1–1.0)
Monocytes Relative: 8 %
Neutro Abs: 11.4 10*3/uL — ABNORMAL HIGH (ref 1.7–7.7)
Neutrophils Relative %: 80 %
Platelets: 297 10*3/uL (ref 150–400)
RBC: 4.23 MIL/uL (ref 4.22–5.81)
RDW: 13.2 % (ref 11.5–15.5)
WBC: 14.3 10*3/uL — ABNORMAL HIGH (ref 4.0–10.5)
nRBC: 0 % (ref 0.0–0.2)

## 2022-02-28 MED ORDER — LIDOCAINE-EPINEPHRINE (PF) 2 %-1:200000 IJ SOLN
10.0000 mL | Freq: Once | INTRAMUSCULAR | Status: AC
  Administered 2022-02-28: 10 mL via INTRADERMAL
  Filled 2022-02-28: qty 20

## 2022-02-28 MED ORDER — ACETAMINOPHEN 500 MG PO TABS
1000.0000 mg | ORAL_TABLET | Freq: Once | ORAL | Status: AC
  Administered 2022-02-28: 1000 mg via ORAL
  Filled 2022-02-28: qty 2

## 2022-02-28 MED ORDER — TETANUS-DIPHTH-ACELL PERTUSSIS 5-2.5-18.5 LF-MCG/0.5 IM SUSY
0.5000 mL | PREFILLED_SYRINGE | Freq: Once | INTRAMUSCULAR | Status: AC
  Administered 2022-02-28: 0.5 mL via INTRAMUSCULAR
  Filled 2022-02-28: qty 0.5

## 2022-02-28 MED ORDER — SULFAMETHOXAZOLE-TRIMETHOPRIM 800-160 MG PO TABS
1.0000 | ORAL_TABLET | Freq: Two times a day (BID) | ORAL | 0 refills | Status: DC
  Filled 2022-02-28: qty 14, 7d supply, fill #0

## 2022-02-28 MED ORDER — MORPHINE SULFATE 15 MG PO TABS
7.5000 mg | ORAL_TABLET | ORAL | 0 refills | Status: DC | PRN
  Filled 2022-02-28: qty 4, 2d supply, fill #0

## 2022-02-28 MED ORDER — ONDANSETRON 4 MG PO TBDP
4.0000 mg | ORAL_TABLET | Freq: Once | ORAL | Status: AC
  Administered 2022-02-28: 4 mg via ORAL
  Filled 2022-02-28: qty 1

## 2022-02-28 MED ORDER — CEPHALEXIN 500 MG PO CAPS
1000.0000 mg | ORAL_CAPSULE | Freq: Once | ORAL | Status: AC
  Administered 2022-02-28: 1000 mg via ORAL
  Filled 2022-02-28: qty 2

## 2022-02-28 MED ORDER — CEPHALEXIN 500 MG PO CAPS
500.0000 mg | ORAL_CAPSULE | Freq: Four times a day (QID) | ORAL | 0 refills | Status: DC
  Filled 2022-02-28: qty 40, 10d supply, fill #0

## 2022-02-28 MED ORDER — SULFAMETHOXAZOLE-TRIMETHOPRIM 800-160 MG PO TABS
1.0000 | ORAL_TABLET | Freq: Once | ORAL | Status: AC
  Administered 2022-02-28: 1 via ORAL
  Filled 2022-02-28: qty 1

## 2022-02-28 MED ORDER — OXYCODONE HCL 5 MG PO TABS
5.0000 mg | ORAL_TABLET | Freq: Once | ORAL | Status: AC
  Administered 2022-02-28: 5 mg via ORAL
  Filled 2022-02-28: qty 1

## 2022-02-28 NOTE — Assessment & Plan Note (Signed)
I believe the patient does have more of the left knee cellulitis and unfortunately I do think that there is a possibility for sepsis at this time.  Likely patient's blood pressure is holding strong at 140/92.  Patient's heart rate is 86.  Unfortunately do not have a thermometer at this time that is working.  I am concerned that the patient is have a fever based on patient's presentation.  On ultrasound I do not see a true effusion of the joint but significant soft tissue swelling and inflammation.  Due to this being 50 month old, as well as dirty metal puncture wound.  Patient's last tetanus was in 2019.  At this moment with the help patient is looking I do think patient needs to go to the emergency room.  I am concerned the patient may need blood cultures as well as IV antibiotics at this time.Patient will go over in a private car.  Patient declined ambulance at this time.  I have called over and discussed with the charge nurse in the emergency room. ?

## 2022-02-28 NOTE — ED Provider Notes (Signed)
Went to [facility]EMERGENCY DEPT Provider Note   CSN: [id number] Arrival date & time: 02/28/22  1123     History  Chief Complaint  Patient presents with   Knee Pain    KEYMON SANDAHL is a 41 y.o. male.  41 yo M with a chief complaint of left knee pain.  The patient unfortunately injured it about a week ago on a piece of metal.  He had taken care of the wound had improved over time.  Last night developed some significant pain to the area.  Subjective chills overnight.  No nausea or vomiting.  Went to urgent care and then went to a sports medicine clinic and was sent here for concern for sepsis.   Knee Pain     Home Medications Prior to Admission medications   Medication Sig Start Date End Date Taking? Authorizing Provider  cephALEXin (KEFLEX) 500 MG capsule Take 1 capsule (500 mg total) by mouth 4 (four) times daily. 02/28/22  Yes Melene Plan, DO  morphine (MSIR) 15 MG tablet Take 0.5 tablets (7.5 mg total) by mouth every 4 (four) hours as needed for severe pain. 02/28/22  Yes Melene Plan, DO  sulfamethoxazole-trimethoprim (BACTRIM DS) 800-160 MG tablet Take 1 tablet by mouth 2 (two) times daily for 7 days. 02/28/22 03/07/22 Yes Jomari Bartnik, DO  Atogepant (QULIPTA) 60 MG TABS Take 1 tablet by mouth daily. 10/11/21   Etta Grandchild, MD  blood glucose meter kit and supplies Dispense based on patient and insurance preference. Use up to four times daily as directed. (FOR ICD-10 E10.9, E11.9). 02/28/21   Etta Grandchild, MD  Blood Glucose Monitoring Suppl (FREESTYLE LITE) w/Device KIT USE AS DIRECTED TO TEST UP TO 4 TIMES DAILY 02/28/21 02/28/22  Etta Grandchild, MD  COVID-19 At Home Antigen Test [facility] COVID-19 HOME TEST) KIT Use as directed 01/08/22   Driscilla Grammes, [facility]  Diclofenac Sodium 2 % SOLN Apply 1 pump twice daily 12/22/15   Judi Saa, DO  fenofibrate 160 MG tablet Take 1 tablet (160 mg total) by mouth daily. 10/11/21   Etta Grandchild, MD   FLUoxetine (PROZAC) 20 MG capsule Take 2 capsules (40 mg total) by mouth daily. 02/01/22   Etta Grandchild, MD  glucose blood (FREESTYLE LITE) test strip USE TO TEST AS DIRECTED UP TO 4 TIMES DAILY 01/31/22 01/31/23  Etta Grandchild, MD  Lancets (FREESTYLE) lancets USE AS DIRECTED TO TEST UP TO 4 TIMES DAILY Patient taking differently: USE AS DIRECTED TO TEST UP TO 4 TIMES DAILY 02/28/21 02/28/22  Etta Grandchild, MD  omega-3 acid ethyl esters (LOVAZA) 1 g capsule Take 2 capsules (2 g total) by mouth 2 (two) times daily. 10/13/21   Etta Grandchild, MD  Pitavastatin Calcium (LIVALO) 2 MG TABS Take 1 tablet (2 mg total) by mouth daily. 10/11/21   Etta Grandchild, MD  SUMAtriptan (IMITREX) 100 MG tablet Take 0.5-1 tablets (50-100 mg total) by mouth every 2 (two) hours as needed for migraine 10/16/21   Etta Grandchild, MD  tamsulosin (FLOMAX) 0.4 MG CAPS capsule Take 1 capsule (0.4 mg total) by mouth daily. 10/12/21   Margarita Grizzle, MD      Allergies    Morphine and related and Statins    Review of Systems   Review of Systems  Physical Exam Updated Vital Signs BP (!) 150/86   Pulse 86   Temp 98.2 F (36.8 C) (Oral)   Resp 18  SpO2 98%  Physical Exam Vitals and nursing note reviewed.  Constitutional:      Appearance: He is well-developed.  HENT:     Head: Normocephalic and atraumatic.  Eyes:     Pupils: Pupils are equal, round, and reactive to light.  Neck:     Vascular: No JVD.  Cardiovascular:     Rate and Rhythm: Normal rate and regular rhythm.     Heart sounds: No murmur heard.   No friction rub. No gallop.  Pulmonary:     Effort: No respiratory distress.     Breath sounds: No wheezing.  Abdominal:     General: There is no distension.     Tenderness: There is no abdominal tenderness. There is no guarding or rebound.  Musculoskeletal:        General: Swelling and tenderness present. Normal range of motion.     Cervical back: Normal range of motion and neck supple.      Comments: Pain and swelling at the inferior aspect of the patella.  Able to range the knee without significant pain.  Pulse motor and sensation intact distally.  Old healed wound to the inferior aspect of the patella.  Fluctuance.  Skin:    Coloration: Skin is not pale.     Findings: No rash.  Neurological:     Mental Status: He is alert and oriented to person, place, and time.  Psychiatric:        Behavior: Behavior normal.    ED Results / Procedures / Treatments   Labs (all labs ordered are listed, but only abnormal results are displayed) Labs Reviewed  CBC WITH DIFFERENTIAL/PLATELET - Abnormal; Notable for the following components:      Result Value   WBC 14.3 (*)    HCT 37.3 (*)    Neutro Abs 11.4 (*)    Monocytes Absolute 1.1 (*)    All other components within normal limits  BASIC METABOLIC PANEL - Abnormal; Notable for the following components:   Sodium 133 (*)    CO2 21 (*)    Glucose, Bld 127 (*)    All other components within normal limits  BODY FLUID CULTURE W GRAM STAIN    EKG None  Radiology DG Knee Complete 4 Views Left  Result Date: 02/28/2022 CLINICAL DATA:  Pain left knee EXAM: LEFT KNEE - COMPLETE 4 VIEW COMPARISON:  None. FINDINGS: No fracture or dislocation is seen. There is soft tissue swelling anterior to the patella. There is possible small effusion in the suprapatellar bursa. IMPRESSION: No fracture or dislocation is seen. There is soft tissue swelling along the anterior aspect suggesting hematoma or bursitis. There is possible small effusion in the suprapatellar bursa. Electronically Signed   By: Ernie Avena M.D.   On: 02/28/2022 12:11    Procedures .Marland KitchenIncision and Drainage  Date/Time: 02/28/2022 1:08 PM Performed by: Melene Plan, DO Authorized by: Melene Plan, DO   Consent:    Consent obtained:  Verbal   Consent given by:  Patient   Risks, benefits, and alternatives were discussed: yes     Risks discussed:  Bleeding, incomplete drainage,  damage to other organs and infection   Alternatives discussed:  No treatment, delayed treatment and alternative treatment Universal protocol:    Procedure explained and questions answered to patient or proxy's satisfaction: yes     Immediately prior to procedure, a time out was called: yes     Patient identity confirmed:  Verbally with patient Location:    Type:  Abscess   Location:  Lower extremity   Lower extremity location:  Knee   Knee location:  L knee Pre-procedure details:    Skin preparation:  Chlorhexidine Sedation:    Sedation type:  None Anesthesia:    Anesthesia method:  Local infiltration   Local anesthetic:  Lidocaine 2% WITH epi Procedure type:    Complexity:  Simple Procedure details:    Ultrasound guidance: yes     Needle aspiration: yes     Needle gauge: 21G.   Incision depth:  Subcutaneous   Drainage:  Serous   Drainage amount: 2cc.   Packing materials:  None Post-procedure details:    Procedure completion:  Tolerated well, no immediate complications   Emergency Focused Ultrasound Exam Limited Ultrasound of Soft Tissue   Performed and interpreted by Dr. Adela Lank Indication: evaluation for infection or foreign body Transverse and Sagittal views of L knee are obtained in real time for the purposes of evaluation of skin and underlying soft tissues.  Findings:  heterogeneous fluid collection, with hyperemia/edema of surrounding tissue Interpretation:  abscess, with cellulitis Images archived electronically.  CPT Codes:    Lower extremity [phone]  Discussed with Dr. Steward Drone, based on my history and exam he felt it was reasonable to start on antibiotics for prepatellar bursitis and have him follow-up in the office.  He stated that typically in his office he would try and aspirate the bursa.  This was done at bedside without issue.  We will have him follow-up with either sports medicine or orthopedics.  1:08 PM:  I have discussed the diagnosis/risks/treatment  options with the patient.  Evaluation and diagnostic testing in the emergency department does not suggest an emergent condition requiring admission or immediate intervention beyond what has been performed at this time.  They will follow up with  PCP, ortho, sports med. We also discussed returning to the ED immediately if new or worsening sx occur. We discussed the sx which are most concerning (e.g., sudden worsening pain, fever, inability to tolerate by mouth) that necessitate immediate return. Medications administered to the patient during their visit and any new prescriptions provided to the patient are listed below.  Medications given during this visit Medications  lidocaine-EPINEPHrine (XYLOCAINE W/EPI) 2 %-1:200000 (PF) injection 10 mL (has no administration in time range)  oxyCODONE (Oxy IR/ROXICODONE) immediate release tablet 5 mg (5 mg Oral Given 02/28/22 1207)  acetaminophen (TYLENOL) tablet 1,000 mg (1,000 mg Oral Given 02/28/22 1207)  ondansetron (ZOFRAN-ODT) disintegrating tablet 4 mg (4 mg Oral Given 02/28/22 1207)  Tdap (BOOSTRIX) injection 0.5 mL (0.5 mLs Intramuscular Given 02/28/22 1207)  sulfamethoxazole-trimethoprim (BACTRIM DS) 800-160 MG per tablet 1 tablet (1 tablet Oral Given 02/28/22 1245)  cephALEXin (KEFLEX) capsule 1,000 mg (1,000 mg Oral Given 02/28/22 1246)     The patient appears reasonably screen and/or stabilized for discharge and I doubt any other medical condition or other [facility] requiring further screening, evaluation, or treatment in the ED at this time prior to discharge.      Medications Ordered in ED Medications  lidocaine-EPINEPHrine (XYLOCAINE W/EPI) 2 %-1:200000 (PF) injection 10 mL (has no administration in time range)  oxyCODONE (Oxy IR/ROXICODONE) immediate release tablet 5 mg (5 mg Oral Given 02/28/22 1207)  acetaminophen (TYLENOL) tablet 1,000 mg (1,000 mg Oral Given 02/28/22 1207)  ondansetron (ZOFRAN-ODT) disintegrating tablet 4 mg (4 mg Oral Given  02/28/22 1207)  Tdap (BOOSTRIX) injection 0.5 mL (0.5 mLs Intramuscular Given 02/28/22 1207)  sulfamethoxazole-trimethoprim (BACTRIM DS) 800-160 MG per tablet 1  tablet (1 tablet Oral Given 02/28/22 1245)  cephALEXin (KEFLEX) capsule 1,000 mg (1,000 mg Oral Given 02/28/22 1246)    ED Course/ Medical Decision Making/ A&P                           Medical Decision Making Amount and/or Complexity of Data Reviewed Labs: ordered. Radiology: ordered.  Risk OTC drugs. Prescription drug management.   41 yo M with a cc of leg pain and swelling.  Had a laceration healed.  Developed some significant pain and swelling last night.  Worsening today.  Was sent from sports medicine clinic with concern for sepsis.  Clinically I think the patient looks well, I doubt that he has bacteremia.  He is afebrile here and has not had a fever at home.  He does have a leukocytosis.  Clinically has prepatellar bursitis.  Ultrasound and sports medicine clinic without obvious effusion though on my ultrasound exam I see a very large prepatellar effusion.  Will discuss with Ortho.  X-ray independently interpreted by me without fracture no retained foreign body.        Final Clinical Impression(s) / ED Diagnoses Final diagnoses:  Prepatellar bursitis of left knee    Rx / DC Orders ED Discharge Orders          Ordered    cephALEXin (KEFLEX) 500 MG capsule  4 times daily        02/28/22 1259    sulfamethoxazole-trimethoprim (BACTRIM DS) 800-160 MG tablet  2 times daily        02/28/22 1259    morphine (MSIR) 15 MG tablet  Every 4 hours PRN        02/28/22 1259              Melene Plan, DO 02/28/22 1309

## 2022-02-28 NOTE — Discharge Instructions (Signed)
Take 4 over the counter ibuprofen tablets 3 times a day or 2 over-the-counter naproxen tablets twice a day for pain. ?Also take tylenol 1000mg (2 extra strength) four times a day.  ? ?Then take the pain medicine if you feel like you need it. Narcotics do not help with the pain, they only make you care about it less.  You can become addicted to this, people may break into your house to steal it.  It will constipate you.  If you drive under the influence of this medicine you can get a DUI.   ? ?Return for rapid spreading redness, fever.  ? ?
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
## 2022-02-28 NOTE — ED Triage Notes (Signed)
Pt arrived via POV, sent from [place] office for possible cellulitis. Fell on left knee on piece of metal approx x1 week ago. Has felt fine, states was healing nicely. In the last 12 hrs, knee becamse, hot, red, swollen. Afebrile in triage. States he felt some chills last night.  ?

## 2022-03-02 ENCOUNTER — Encounter: Payer: Self-pay | Admitting: Family Medicine

## 2022-03-02 ENCOUNTER — Telehealth: Payer: Self-pay

## 2022-03-02 LAB — BODY FLUID CULTURE W GRAM STAIN

## 2022-03-02 NOTE — Telephone Encounter (Signed)
Elyse with Microbiology at [facility] calling to report that patient was seen at [facility] ED 02/28/22 and was positive for staphylococcus aureus from synovial fluid of bursa.  ? ?

## 2022-03-03 ENCOUNTER — Other Ambulatory Visit: Payer: Self-pay | Admitting: Internal Medicine

## 2022-03-04 ENCOUNTER — Emergency Department (HOSPITAL_COMMUNITY): Payer: 59

## 2022-03-04 ENCOUNTER — Encounter (HOSPITAL_COMMUNITY): Payer: Self-pay | Admitting: Emergency Medicine

## 2022-03-04 ENCOUNTER — Other Ambulatory Visit: Payer: Self-pay

## 2022-03-04 ENCOUNTER — Observation Stay (HOSPITAL_COMMUNITY)
Admission: EM | Admit: 2022-03-04 | Discharge: 2022-03-05 | Disposition: A | Discharge status: Home / Self Care | Payer: 59 | Attending: Internal Medicine | Admitting: Internal Medicine

## 2022-03-04 DIAGNOSIS — M25562 Pain in left knee: Secondary | ICD-10-CM | POA: Diagnosis not present

## 2022-03-04 DIAGNOSIS — M71162 Other infective bursitis, left knee: Principal | ICD-10-CM

## 2022-03-04 DIAGNOSIS — R011 Cardiac murmur, unspecified: Secondary | ICD-10-CM

## 2022-03-04 DIAGNOSIS — E872 Acidosis, unspecified: Secondary | ICD-10-CM

## 2022-03-04 DIAGNOSIS — R1110 Vomiting, unspecified: Secondary | ICD-10-CM

## 2022-03-04 DIAGNOSIS — Z79899 Other long term (current) drug therapy: Secondary | ICD-10-CM | POA: Diagnosis not present

## 2022-03-04 DIAGNOSIS — G43909 Migraine, unspecified, not intractable, without status migrainosus: Secondary | ICD-10-CM | POA: Insufficient documentation

## 2022-03-04 DIAGNOSIS — B9561 Methicillin susceptible Staphylococcus aureus infection as the cause of diseases classified elsewhere: Secondary | ICD-10-CM | POA: Diagnosis not present

## 2022-03-04 DIAGNOSIS — R112 Nausea with vomiting, unspecified: Secondary | ICD-10-CM | POA: Diagnosis not present

## 2022-03-04 DIAGNOSIS — E860 Dehydration: Secondary | ICD-10-CM

## 2022-03-04 DIAGNOSIS — A419 Sepsis, unspecified organism: Secondary | ICD-10-CM | POA: Diagnosis not present

## 2022-03-04 DIAGNOSIS — M7989 Other specified soft tissue disorders: Secondary | ICD-10-CM | POA: Diagnosis not present

## 2022-03-04 LAB — COMPREHENSIVE METABOLIC PANEL
ALT: 56 U/L — ABNORMAL HIGH (ref 0–44)
ALT: 43 U/L (ref 0–44)
AST: 42 U/L — ABNORMAL HIGH (ref 15–41)
AST: 28 U/L (ref 15–41)
Albumin: 4.2 g/dL (ref 3.5–5.0)
Albumin: 3.5 g/dL (ref 3.5–5.0)
Alkaline Phosphatase: 49 U/L (ref 38–126)
Alkaline Phosphatase: 42 U/L (ref 38–126)
Anion gap: 11 (ref 5–15)
Anion gap: 9 (ref 5–15)
BUN: 19 mg/dL (ref 6–20)
BUN: 18 mg/dL (ref 6–20)
CO2: 24 mmol/L (ref 22–32)
CO2: 24 mmol/L (ref 22–32)
Calcium: 10 mg/dL (ref 8.9–10.3)
Calcium: 9.4 mg/dL (ref 8.9–10.3)
Chloride: 103 mmol/L (ref 98–111)
Chloride: 104 mmol/L (ref 98–111)
Creatinine, Ser: 0.79 mg/dL (ref 0.61–1.24)
Creatinine, Ser: 0.81 mg/dL (ref 0.61–1.24)
GFR, Estimated: >60 mL/min (ref >60)
GFR, Estimated: >60 mL/min (ref >60)
Glucose, Bld: 178 mg/dL — ABNORMAL HIGH (ref 70–99)
Glucose, Bld: 205 mg/dL — ABNORMAL HIGH (ref 70–99)
Potassium: 3.7 mmol/L (ref 3.5–5.1)
Potassium: 3.7 mmol/L (ref 3.5–5.1)
Sodium: 138 mmol/L (ref 135–145)
Sodium: 137 mmol/L (ref 135–145)
Total Bilirubin: 0.4 mg/dL (ref 0.3–1.2)
Total Bilirubin: 0.5 mg/dL (ref 0.3–1.2)
Total Protein: 8.9 g/dL — ABNORMAL HIGH (ref 6.5–8.1)
Total Protein: 7.2 g/dL (ref 6.5–8.1)

## 2022-03-04 LAB — CBC WITH DIFFERENTIAL/PLATELET
Abs Immature Granulocytes: 0.03 10*3/uL (ref 0.00–0.07)
Basophils Absolute: 0.1 10*3/uL (ref 0.0–0.1)
Basophils Relative: 1 %
Eosinophils Absolute: 0.1 10*3/uL (ref 0.0–0.5)
Eosinophils Relative: 1 %
HCT: 38.2 % — ABNORMAL LOW (ref 39.0–52.0)
Hemoglobin: 12.9 g/dL — ABNORMAL LOW (ref 13.0–17.0)
Immature Granulocytes: 0 %
Lymphocytes Relative: 21 %
Lymphs Abs: 1.6 10*3/uL (ref 0.7–4.0)
MCH: 30.1 pg (ref 26.0–34.0)
MCHC: 33.8 g/dL (ref 30.0–36.0)
MCV: 89 fL (ref 80.0–100.0)
Monocytes Absolute: 0.3 10*3/uL (ref 0.1–1.0)
Monocytes Relative: 3 %
Neutro Abs: 5.6 10*3/uL (ref 1.7–7.7)
Neutrophils Relative %: 74 %
Platelets: 354 10*3/uL (ref 150–400)
RBC: 4.29 MIL/uL (ref 4.22–5.81)
RDW: 12.9 % (ref 11.5–15.5)
WBC: 7.6 10*3/uL (ref 4.0–10.5)
nRBC: 0 % (ref 0.0–0.2)

## 2022-03-04 LAB — SEDIMENTATION RATE: Sed Rate: 55 mm/hr — ABNORMAL HIGH (ref 0–16)

## 2022-03-04 LAB — LACTIC ACID, PLASMA
Lactic Acid, Venous: 2.2 mmol/L (ref 0.5–1.9)
Lactic Acid, Venous: 2.3 mmol/L (ref 0.5–1.9)
Lactic Acid, Venous: 1.9 mmol/L (ref 0.5–1.9)

## 2022-03-04 LAB — C-REACTIVE PROTEIN: CRP: 5.9 mg/dL — ABNORMAL HIGH (ref <1.0)

## 2022-03-04 MED ORDER — ENOXAPARIN SODIUM 40 MG/0.4ML IJ SOSY
40.0000 mg | PREFILLED_SYRINGE | INTRAMUSCULAR | Status: DC
  Administered 2022-03-04: 40 mg via SUBCUTANEOUS
  Filled 2022-03-04: qty 0.4

## 2022-03-04 MED ORDER — ACETAMINOPHEN 325 MG PO TABS
650.0000 mg | ORAL_TABLET | Freq: Four times a day (QID) | ORAL | Status: DC | PRN
Start: 2022-03-04 — End: 2022-03-05

## 2022-03-04 MED ORDER — ONDANSETRON HCL 4 MG/2ML IJ SOLN: 4.0000 mg | Freq: Four times a day (QID) | INTRAMUSCULAR | Status: DC | PRN

## 2022-03-04 MED ORDER — FLUOXETINE HCL 20 MG PO CAPS
40.0000 mg | ORAL_CAPSULE | Freq: Every day | ORAL | Status: DC
  Administered 2022-03-04: 40 mg via ORAL
  Filled 2022-03-04: qty 2

## 2022-03-04 MED ORDER — FENTANYL CITRATE PF 50 MCG/ML IJ SOSY
50.0000 ug | PREFILLED_SYRINGE | Freq: Once | INTRAMUSCULAR | Status: AC
  Administered 2022-03-04: 50 ug via INTRAVENOUS
  Filled 2022-03-04: qty 1

## 2022-03-04 MED ORDER — ACETAMINOPHEN 325 MG PO TABS
650.0000 mg | ORAL_TABLET | Freq: Once | ORAL | Status: AC
  Administered 2022-03-04: 650 mg via ORAL
  Filled 2022-03-04: qty 2

## 2022-03-04 MED ORDER — SUMATRIPTAN SUCCINATE 50 MG PO TABS
50.0000 mg | ORAL_TABLET | ORAL | Status: DC | PRN
  Filled 2022-03-04: qty 2

## 2022-03-04 MED ORDER — HYDROMORPHONE HCL 1 MG/ML IJ SOLN: 0.5000 mg | INTRAMUSCULAR | Status: DC | PRN

## 2022-03-04 MED ORDER — CEFAZOLIN SODIUM-DEXTROSE 2-4 GM/100ML-% IV SOLN: 2.0000 g | Freq: Three times a day (TID) | INTRAVENOUS | Status: DC

## 2022-03-04 MED ORDER — ONDANSETRON HCL 4 MG PO TABS: 4.0000 mg | ORAL_TABLET | Freq: Four times a day (QID) | ORAL | Status: DC | PRN

## 2022-03-04 MED ORDER — SODIUM CHLORIDE 0.9 % IV SOLN: INTRAVENOUS | Status: DC

## 2022-03-04 MED ORDER — LACTATED RINGERS IV BOLUS
1000.0000 mL | Freq: Once | INTRAVENOUS | Status: AC
  Administered 2022-03-04: 1000 mL via INTRAVENOUS

## 2022-03-04 MED ORDER — ZOLPIDEM TARTRATE 5 MG PO TABS
5.0000 mg | ORAL_TABLET | Freq: Every evening | ORAL | Status: DC | PRN
  Administered 2022-03-04: 5 mg via ORAL
  Filled 2022-03-04: qty 1

## 2022-03-04 MED ORDER — ONDANSETRON HCL 4 MG/2ML IJ SOLN
4.0000 mg | Freq: Once | INTRAMUSCULAR | Status: AC
  Administered 2022-03-04: 4 mg via INTRAVENOUS
  Filled 2022-03-04: qty 2

## 2022-03-04 MED ORDER — SODIUM CHLORIDE 0.9 % IV SOLN
2.0000 g | Freq: Once | INTRAVENOUS | Status: AC
  Administered 2022-03-04: 2 g via INTRAVENOUS
  Filled 2022-03-04: qty 20

## 2022-03-04 MED ORDER — VANCOMYCIN HCL 2000 MG/400ML IV SOLN
2000.0000 mg | Freq: Once | INTRAVENOUS | Status: AC
  Administered 2022-03-04: 2000 mg via INTRAVENOUS
  Filled 2022-03-04: qty 400

## 2022-03-04 MED ORDER — FAMOTIDINE 20 MG PO TABS: 20.0000 mg | ORAL_TABLET | Freq: Every day | ORAL | Status: DC | PRN

## 2022-03-04 MED ORDER — IBUPROFEN 400 MG PO TABS: 400.0000 mg | ORAL_TABLET | Freq: Four times a day (QID) | ORAL | Status: DC | PRN

## 2022-03-04 NOTE — ED Notes (Signed)
MD paged to see if MS or Tele d/t murmur and pending echocardiogram.  ?
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
## 2022-03-04 NOTE — ED Provider Notes (Addendum)
?[facility] DEPT ?Provider Note ? ? ?CSN: [id number] ?Arrival date & time: 03/04/22  1337 ? ?  ? ?History ? ?Chief Complaint  ?Patient presents with  ? Knee Pain  ? ? ?Jason Phillips is a 41 y.o. male who was recently seen in the emergency department on 3/15 For pain, redness, and swelling to the anterior left knee approximately 1 week after injuring the knee with piece of metal that lacerated the anterior patella.  At that time patient had developed chills and was referred to the emergency department by urgent care.  In the ED he had incision and drainage and aspiration of the anterior knee due to concern for cellulitis questionable abscess. Only serous drainage from incision and drainage at that time.  Case was discussed with Dr. Sammuel Hines, orthopedist, who recommended antibiotics for prepatellar bursitis and said that he would need follow-up in the office.  Patient was discharged on both Bactrim DS and cephalexin 5 mg 4 times daily. ? ?He presents today due to concern for worsening chills, nausea with 10 episodes of NBNB emesis in the last 24 hours, significant fatigue, and headache.  Denies any neck pain or stiffness.  Patient has not spiked fever today though he was having fevers with Tmax 102 ?F following recent ED visit. He does endorse significant improvement in the left knee pain and swelling. ? ?He presents with his wife at the bedside who is a Software engineer at [employer]. His wife states that he is no longer on the Bactrim at this time as cultures from his knee aspiration revealed MSSA.  ? ?I personally reviewed this patient's medical records.  He has history of hyperlipidemia, mood disorder, GERD, bilateral hearing loss.  He is on Prozac, atorvastatin, fenofibrate, and Keflex.   ?HPI ? ?  ? ?Home Medications ?Prior to Admission medications   ?Medication Sig Start Date End Date Taking? Authorizing Provider  ?blood glucose meter kit and supplies Dispense based on  patient and insurance preference. Use up to four times daily as directed. (FOR ICD-10 E10.9, E11.9). 02/28/21   [name], MD  ?cephALEXin (KEFLEX) 500 MG capsule Take 1 capsule (500 mg total) by mouth 4 (four) times daily. 02/28/22   Deno Etienne, DO  ?COVID-19 At Home Antigen Test [facility] COVID-19 HOME TEST) KIT Use as directed 01/08/22   Jefm Bryant, RPH  ?Diclofenac Sodium 2 % SOLN Apply 1 pump twice daily 12/22/15   [name], DO  ?fenofibrate 160 MG tablet Take 1 tablet (160 mg total) by mouth daily. 10/11/21   [name], MD  ?FLUoxetine (PROZAC) 20 MG capsule Take 2 capsules (40 mg total) by mouth daily. 02/01/22   [name], MD  ?glucose blood (FREESTYLE LITE) test strip USE TO TEST AS DIRECTED UP TO 4 TIMES DAILY 01/31/22 01/31/23  [name], MD  ?omega-3 acid ethyl esters (LOVAZA) 1 g capsule Take 2 capsules (2 g total) by mouth 2 (two) times daily. 10/13/21   [name], MD  ?Pitavastatin Calcium (LIVALO) 2 MG TABS Take 1 tablet (2 mg total) by mouth daily. 10/11/21   [name], MD  ?sulfamethoxazole-trimethoprim (BACTRIM DS) 800-160 MG tablet Take 1 tablet by mouth 2 (two) times daily for 7 days. 02/28/22 03/07/22  Deno Etienne, DO  ?SUMAtriptan (IMITREX) 100 MG tablet Take 0.5-1 tablets (50-100 mg total) by mouth every 2 (two) hours as needed for migraine 10/16/21   [name], MD  ?   ? ?  Allergies    ?Morphine and related and Statins   ? ?Review of Systems   ?Review of Systems  ?Constitutional:  Positive for activity change, appetite change, chills, fatigue and fever.  ?HENT: Negative.    ?Respiratory: Negative.    ?Cardiovascular:  Positive for palpitations. Negative for chest pain and leg swelling.  ?Gastrointestinal:  Positive for nausea and vomiting. Negative for abdominal pain and diarrhea.  ?Genitourinary:  Positive for decreased urine volume. Negative for penile pain, penile swelling, scrotal swelling, testicular pain and urgency.  ?Musculoskeletal:   ?      Left knee pain  ?Skin:  Positive for wound.  ?Neurological: Negative.   ? ?Physical Exam ?Updated Vital Signs ?BP (!) 147/91   Pulse 66   Temp 97.8 ?F (36.6 ?C) (Oral)   Resp 13   Ht 5' 7" (1.702 m)   Wt 95 kg   SpO2 98%   BMI 32.80 kg/m?  ?Physical Exam ?Vitals and nursing note reviewed.  ?Constitutional:   ?   Appearance: He is ill-appearing. He is not toxic-appearing.  ?HENT:  ?   Head: Normocephalic and atraumatic.  ?   Nose: Nose normal.  ?   Mouth/Throat:  ?   Mouth: Mucous membranes are moist.  ?   Pharynx: Oropharynx is clear. Uvula midline. No oropharyngeal exudate or posterior oropharyngeal erythema.  ?   Tonsils: No tonsillar exudate.  ?Eyes:  ?   General: Lids are normal. Vision grossly intact.     ?   Right eye: No discharge.     ?   Left eye: No discharge.  ?   Extraocular Movements: Extraocular movements intact.  ?   Conjunctiva/sclera: Conjunctivae normal.  ?   Pupils: Pupils are equal, round, and reactive to light.  ?Neck:  ?   Trachea: Trachea and phonation normal.  ?Cardiovascular:  ?   Rate and Rhythm: Normal rate and regular rhythm.  ?   Pulses: Normal pulses.     ?     Dorsalis pedis pulses are 2+ on the right side and 2+ on the left side.  ?   Heart sounds: Murmur heard.  ?Systolic murmur is present with a grade of 2/6.  ?   Comments: Swelling of the left lower leg from the ankle to the proximal tibia, per patient improved. Normal neurovascular status in the feet bilaterally. ?Mild TTP along the medial and lateral aspects of the left calf without erythema or crepitus ?Pulmonary:  ?   Effort: Pulmonary effort is normal. No tachypnea, bradypnea, accessory muscle usage, prolonged expiration or respiratory distress.  ?   Breath sounds: Normal breath sounds. No wheezing or rales.  ?Chest:  ?   Chest wall: No mass, lacerations, deformity, swelling, tenderness, crepitus or edema.  ?Abdominal:  ?   General: Bowel sounds are normal. There is no distension.  ?   Palpations: Abdomen is soft.  ?    Tenderness: There is no abdominal tenderness.  ?Musculoskeletal:     ?   General: No deformity.  ?   Cervical back: Normal range of motion and neck supple.  ?   Right lower leg: No edema.  ?   Left lower leg: 1+ Edema present.  ?Lymphadenopathy:  ?   Cervical: No cervical adenopathy.  ?Skin: ?   General: Skin is warm and dry.  ?   Capillary Refill: Capillary refill takes less than 2 seconds.  ? ?    ?Neurological:  ?   General: No focal deficit present.  ?  Mental Status: He is alert and oriented to person, place, and time. Mental status is at baseline.  ?Psychiatric:     ?   Mood and Affect: Mood normal.  ? ? ? ? ? ?ED Results / Procedures / Treatments   ?Labs ?(all labs ordered are listed, but only abnormal results are displayed) ?Labs Reviewed  ?CBC WITH DIFFERENTIAL/PLATELET - Abnormal; Notable for the following components:  ?    Result Value  ? Hemoglobin 12.9 (*)   ? HCT 38.2 (*)   ? All other components within normal limits  ?COMPREHENSIVE METABOLIC PANEL - Abnormal; Notable for the following components:  ? Glucose, Bld 178 (*)   ? Total Protein 8.9 (*)   ? AST 42 (*)   ? ALT 56 (*)   ? All other components within normal limits  ?SEDIMENTATION RATE - Abnormal; Notable for the following components:  ? Sed Rate 55 (*)   ? All other components within normal limits  ?LACTIC ACID, PLASMA - Abnormal; Notable for the following components:  ? Lactic Acid, Venous 2.2 (*)   ? All other components within normal limits  ?CULTURE, BLOOD (ROUTINE X 2)  ?CULTURE, BLOOD (ROUTINE X 2)  ?C-REACTIVE PROTEIN  ?LACTIC ACID, PLASMA  ?HIV ANTIBODY (ROUTINE TESTING W REFLEX)  ?CBC  ?CREATININE, SERUM  ? ? ?EKG ?None ? ?Radiology ?DG Chest Portable 1 View ? ?Result Date: 03/04/2022 ?CLINICAL DATA:  Sepsis workup.  Left knee drained 4 days ago. EXAM: PORTABLE CHEST 1 VIEW COMPARISON:  December 11, 2017 FINDINGS: The heart size and mediastinal contours are within normal limits. Both lungs are clear. The visualized skeletal structures  are unremarkable. IMPRESSION: No active disease. Electronically Signed   By: Dorise Bullion III M.D.   On: 03/04/2022 16:13  ? ?DG Knee Complete 4 Views Left ? ?Result Date: 03/04/2022 ?CLINICAL DATA:  LEFT knee

## 2022-03-04 NOTE — ED Triage Notes (Signed)
Patient L knee drained 3/15. Fever since that time. Chills and vomiting this morning.  ?

## 2022-03-04 NOTE — Plan of Care (Signed)
  Problem: Activity: Goal: Risk for activity intolerance will decrease Outcome: Progressing   Problem: Nutrition: Goal: Adequate nutrition will be maintained Outcome: Progressing   Problem: Pain Managment: Goal: General experience of comfort will improve Outcome: Progressing   Problem: Safety: Goal: Ability to remain free from injury will improve Outcome: Progressing   

## 2022-03-04 NOTE — Progress Notes (Addendum)
ID brief note ? ?Called by [facility] ED about this patient who was seen here for knee pain 3/15 -- aspirated and sent home with cephalexin/bactrim ? ?Culture ultimately grew mssa post ed visit ? ?Patient returned today feeling worse overall but improved knee pain ? ? ?Afebrile ?Mild lft elevation ? ? ?-discussed with PA Rebekah to admit patient for further w/u of staph aureus infection (usually a hematogenous spread to knee so need to r/o systemic involvement) ?-blood cx ?-tte ?-cefazolin 2 gram iv q8hr ?-will add to ID list to be seen tomorrow ? ?-------------- ?I discussed case with dr Butler Denmark the admitting hospitalist.  ?She reported (and confirmed with patient) this was a prepatellar bursitis. The aspirate sample was the bursa not the knee joint.  ? ?In this setting I would resume treatment for another 10-14 days with cefadroxil 1 gram twice daily (better absorbed and easier to take than cephalexin qid) ? ?I agree if no sepsis and all he was concerned about was vomiting/gi side effect from abx no need for admission but will defer to hospitalist team ? ?No need for ID formal consult ?

## 2022-03-04 NOTE — ED Provider Triage Note (Signed)
Emergency Medicine Provider Triage Evaluation Note ? ?Jason Phillips , a 41 y.o. male  was evaluated in triage.  Pt complains of left knee pain, vomiting, nausea, headache. The patient recently had his knee tapped int he ER on 02/28/22 and came back MSSA. The patient was put on Keflex and Bactrim which the patient reports adherence to. The next day, Thursday, he started running temperatures of 102F. Friday his temperatures improved with tylenol and ibuprofen. Saturday he was feeling better all together. Today he had worsening pain with diaphoresis, nausea, and vomiting. He also reports a history of headaches and has a headache that he reports is typical for him and has been improving. Denies any chest pain or SOB. His wife reports he had fevers that were in the 95Fs and she was concerned for sepsis.  ? ?Review of Systems  ?Positive: Knee pain, diaphoresis, nausea, vomiting, headache, fever ?Negative: Chest pain, SOB ? ?Physical Exam  ?BP (!) 139/92 (BP Location: Left Arm)   Pulse 66   Temp 97.8 ?F (36.6 ?C) (Oral)   Resp 18   SpO2 98%  ?Gen:   Awake, uncomfortable ?Resp:  Normal effort  ?MSK:   Moves extremities without difficulty, The patient is still able to flex and extend the knee. Mild swelling noted to the left knee. No palpable crepitus. ?Other:   ? ?Medical Decision Making  ?Medically screening exam initiated at 1:50 PM.  Appropriate orders placed.  TRAVARUS TRUDO was informed that the remainder of the evaluation will be completed by another provider, this initial triage assessment does not replace that evaluation, and the importance of remaining in the ED until their evaluation is complete. ? ?Patients vitals are stable and do not indicate sepsis at this time, but given the fevers with recent joint tap and MSSA culture, I think its reasonable. Lactic and blood cultures obtained as well as a repeat image.  ?  ?Achille Rich, PA-C ?03/04/22 1356 ? ?

## 2022-03-04 NOTE — Progress Notes (Signed)
A consult was received from an ED physician for vanc per pharmacy dosing.  The patient's profile has been reviewed for ht/wt/allergies/indication/available labs.   ?A one time order has been placed for vanc 2g.  Further antibiotics/pharmacy consults should be ordered by admitting physician if indicated.       ?                ?Thank you, ?Berkley Harvey ?03/04/2022  4:18 PM ? ?

## 2022-03-05 ENCOUNTER — Other Ambulatory Visit (HOSPITAL_COMMUNITY): Payer: 59

## 2022-03-05 ENCOUNTER — Observation Stay (HOSPITAL_BASED_OUTPATIENT_CLINIC_OR_DEPARTMENT_OTHER): Payer: 59

## 2022-03-05 ENCOUNTER — Other Ambulatory Visit (HOSPITAL_COMMUNITY): Payer: Self-pay

## 2022-03-05 ENCOUNTER — Encounter (HOSPITAL_COMMUNITY): Payer: Self-pay | Admitting: Internal Medicine

## 2022-03-05 DIAGNOSIS — E872 Acidosis, unspecified: Secondary | ICD-10-CM | POA: Diagnosis not present

## 2022-03-05 DIAGNOSIS — Z79899 Other long term (current) drug therapy: Secondary | ICD-10-CM | POA: Diagnosis not present

## 2022-03-05 DIAGNOSIS — M71162 Other infective bursitis, left knee: Secondary | ICD-10-CM | POA: Diagnosis not present

## 2022-03-05 DIAGNOSIS — R011 Cardiac murmur, unspecified: Secondary | ICD-10-CM

## 2022-03-05 DIAGNOSIS — B9561 Methicillin susceptible Staphylococcus aureus infection as the cause of diseases classified elsewhere: Secondary | ICD-10-CM | POA: Diagnosis not present

## 2022-03-05 DIAGNOSIS — R112 Nausea with vomiting, unspecified: Secondary | ICD-10-CM | POA: Diagnosis not present

## 2022-03-05 DIAGNOSIS — E860 Dehydration: Secondary | ICD-10-CM | POA: Diagnosis not present

## 2022-03-05 DIAGNOSIS — G43909 Migraine, unspecified, not intractable, without status migrainosus: Secondary | ICD-10-CM | POA: Diagnosis not present

## 2022-03-05 LAB — ECHOCARDIOGRAM COMPLETE
AR max vel: 2.3 cm2
AV Area VTI: 1.98 cm2
AV Area mean vel: 2.04 cm2
AV Mean grad: 6 mmHg
AV Peak grad: 10.8 mmHg
Ao pk vel: 1.64 m/s
Area-P 1/2: 2.76 cm2
Calc EF: 64 %
Height: 67 in
MV M vel: 4.57 m/s
MV Peak grad: 83.5 mmHg
S' Lateral: 2.7 cm
Single Plane A2C EF: 64.8 %
Single Plane A4C EF: 62.5 %
Weight: 3350.99 oz

## 2022-03-05 MED ORDER — CEFADROXIL 500 MG PO CAPS
500.0000 mg | ORAL_CAPSULE | Freq: Two times a day (BID) | ORAL | 0 refills | Status: DC
Start: 2022-03-05 — End: 2022-04-12
  Filled 2022-03-05: qty 19, 10d supply, fill #0

## 2022-03-05 MED ORDER — ONDANSETRON HCL 4 MG PO TABS
4.0000 mg | ORAL_TABLET | Freq: Four times a day (QID) | ORAL | 0 refills | Status: DC | PRN
  Filled 2022-03-05: qty 20, 5d supply, fill #0

## 2022-03-05 MED ORDER — CEFADROXIL 500 MG PO CAPS
500.0000 mg | ORAL_CAPSULE | Freq: Every day | ORAL | Status: DC
  Administered 2022-03-05: 500 mg via ORAL
  Filled 2022-03-05: qty 1

## 2022-03-05 NOTE — Progress Notes (Signed)
? ?  Echocardiogram ?2D Echocardiogram has been performed. ? ?Jason Phillips ?03/05/2022, 9:42 AM ?

## 2022-03-05 NOTE — Progress Notes (Signed)
?  Transition of Care (TOC) Screening Note ? ? ?Patient Details  ?Name: Jason Phillips ?Date of Birth: [birth year] ? ? ?Transition of Care (TOC) CM/SW Contact:    ?Seini Lannom, LCSW ?Phone Number: ?03/05/2022, 9:21 AM ? ? ? ?Transition of Care Department [facility]) has reviewed patient and no TOC needs have been identified at this time. We will continue to monitor patient advancement through interdisciplinary progression rounds. If new patient transition needs arise, please place a TOC consult. ? ? ?

## 2022-03-05 NOTE — Plan of Care (Signed)
  Problem: Coping: Goal: Level of anxiety will decrease Outcome: Progressing   Problem: Pain Managment: Goal: General experience of comfort will improve Outcome: Progressing   

## 2022-03-05 NOTE — Discharge Summary (Signed)
?Physician Discharge Summary ?  ?Patient: Jason Phillips MRN: [id number] DOB: 03-10-1981  ?Admit date:     03/04/2022  ?Discharge date: 03/05/22  ?Discharge Physician: Debbe Odea  ? ?PCP: [name], MD  ? ?Recommendations at discharge:  ? ? Giving 10 days of antibiotics, outpt PCP and/ or Ortho to please decide if the course should be extended ? ?Discharge Diagnoses: ?Principal Problem: ?  Vomiting ?Active Problems: ?  Infection of left prepatellar bursa ?  Dehydration ?  Murmur, cardiac ?  Lactic acidosis ? ?Resolved Problems: ?  * No resolved hospital problems. * ? ?HPI: ?Jason Phillips is a 41 y.o. male with medical history of depression and GERD presents with body aches and general feeling of being unwell.  The patient had a laceration to his left knee earlier this month and on 3/15 was seen in the ED.  He was diagnosed with an infected prepatellar bursa and discharged home with Keflex and Bactrim after the bursa was aspirated. ?The patient had a temperature of 102 degrees the following day.  He did start the Bactrim and the Keflex but has been feeling generally unwell with sweats and body aches.  He grew MSSA from the aspirate and stopped Bactrim as advised. He vomited this morning and has a poor appetite today. His temp at home was in the 94-95 range.  ?The knee overall has improved. ? ?Assessment and Plan: ? Principal Problem: ?  Vomiting/ dehydration/ lactic acidosis ?- ? If secondary to antibiotics- will place him in observation ?- IVF given at NS at 75 cc/hr, antiemetics PRN  ?-  lactic acid repeated and has normalized ?- no further vomiting, eating and drinking well, urinating well ?- he does have GERD and takes Pepcid at home. I have added PRN Zofran as well ?  ?Active Problems: ?  Infection of left prepatellar bursa with MSSA ?- given Vanc and Ceftriaxone in ED ?- Knee appears to be improving, WBC better, last fever was on 3/16 ?- he had temps of 94-95 at home but temp is normal here ?-  blood cultures negative   ?- - changing to Cefadroxil based on ID recommendations - ordered 10 days ?- he will f/u with ortho of his choice as outpt later this well ?  ?Mildly elevated LFTs ?- noted to be slightly elevated in the past as well ?- resolved ?  ?  Murmur, cardiac ?- RUS border murmur ?- his wife is requesting an ECHO to r/u endocarditis ?- ECHO completed and report below-  ?  ?Depression ?- cont Prozac ?  ?Migraines ?- cont Imitrex PRN ?  ? ? ?Consultants: phone consult with ID ?Procedures performed: none  ?Disposition: Home ?Diet recommendation:  ? ?DISCHARGE MEDICATION: ?Allergies as of 03/05/2022   ? ?   Reactions  ? Morphine And Related Nausea And Vomiting  ? Severe projectile vomiting  ? Statins   ? Muscle and body aches  ? ?  ? ?  ?Medication List  ?  ? ?STOP taking these medications   ? ?cephALEXin 500 MG capsule ?Commonly known as: KEFLEX ?  ?Diclofenac Sodium 2 % Soln ?  ?famotidine 20 MG tablet ?Commonly known as: PEPCID ?  ?sulfamethoxazole-trimethoprim 800-160 MG tablet ?Commonly known as: BACTRIM DS ?  ? ?  ? ?TAKE these medications   ? ?blood glucose meter kit and supplies ?Dispense based on patient and insurance preference. Use up to four times daily as directed. (FOR ICD-10 E10.9, E11.9). ?  ?Carestart COVID-19  Home Test Kit ?Generic drug: COVID-19 At Home Antigen Test ?Use as directed ?  ?cefadroxil 500 MG capsule ?Commonly known as: DURICEF ?Take 1 capsule (500 mg total) by mouth 2 (two) times daily. ?  ?diphenhydrAMINE 25 MG tablet ?Commonly known as: BENADRYL ?Take 25 mg by mouth at bedtime. ?  ?fenofibrate 160 MG tablet ?Take 1 tablet (160 mg total) by mouth daily. ?What changed: when to take this ?  ?FLUoxetine 20 MG capsule ?Commonly known as: PROZAC ?Take 2 capsules (40 mg total) by mouth daily. ?What changed: when to take this ?  ?FREESTYLE LITE test strip ?Generic drug: glucose blood ?USE TO TEST AS DIRECTED UP TO 4 TIMES DAILY ?  ?ibuprofen 800 MG tablet ?Commonly known as:  ADVIL ?Take 800 mg by mouth every 8 (eight) hours as needed for headache. ?  ?Livalo 2 MG Tabs ?Generic drug: Pitavastatin Calcium ?Take 1 tablet (2 mg total) by mouth daily. ?What changed: when to take this ?  ?omega-3 acid ethyl esters 1 g capsule ?Commonly known as: LOVAZA ?Take 2 capsules (2 g total) by mouth 2 (two) times daily. ?What changed: when to take this ?  ?ondansetron 4 MG tablet ?Commonly known as: ZOFRAN ?Take 1 tablet (4 mg total) by mouth every 6 (six) hours as needed for nausea. ?  ?ondansetron 8 MG disintegrating tablet ?Commonly known as: ZOFRAN-ODT ?Take 4 mg by mouth every 4 (four) hours as needed for nausea or vomiting. ?  ?SUMAtriptan 100 MG tablet ?Commonly known as: IMITREX ?Take 0.5-1 tablets (50-100 mg total) by mouth every 2 (two) hours as needed for migraine ?What changed:  ?how much to take ?reasons to take this ?  ?zolpidem 5 MG tablet ?Commonly known as: AMBIEN ?Take 5 mg by mouth at bedtime as needed for sleep. ?  ? ?  ? ? ?Discharge Exam: ?Danley Danker Weights  ? 03/04/22 1550  ?Weight: 95 kg  ? ?  ? ?Condition at discharge: good ? ?The results of significant diagnostics from this hospitalization (including imaging, microbiology, ancillary and laboratory) are listed below for reference.  ? ?Imaging Studies: ?DG Chest Portable 1 View ? ?Result Date: 03/04/2022 ?CLINICAL DATA:  Sepsis workup.  Left knee drained 4 days ago. EXAM: PORTABLE CHEST 1 VIEW COMPARISON:  December 11, 2017 FINDINGS: The heart size and mediastinal contours are within normal limits. Both lungs are clear. The visualized skeletal structures are unremarkable. IMPRESSION: No active disease. Electronically Signed   By: Dorise Bullion III M.D.   On: 03/04/2022 16:13  ? ?DG Knee Complete 4 Views Left ? ?Result Date: 03/04/2022 ?CLINICAL DATA:  LEFT knee pain and fever. LEFT knee drained 4 days ago. Initial encounter. EXAM: LEFT KNEE - COMPLETE 4+ VIEW COMPARISON:  02/28/2022 radiograph FINDINGS: There is no evidence of acute  fracture, subluxation or dislocation. No radiographic evidence of acute osteomyelitis noted. Prepatellar soft tissue swelling is again noted. An equivocal small effusion in the suprapatellar bursa may be present. IMPRESSION: 1. Prepatellar soft tissue swelling and equivocal small knee effusion. 2. No evidence of acute bony abnormality or radiographic evidence of acute osteomyelitis. 3. No other significant change. Electronically Signed   By: Margarette Canada M.D.   On: 03/04/2022 14:15  ? ?DG Knee Complete 4 Views Left ? ?Result Date: 02/28/2022 ?CLINICAL DATA:  Pain left knee EXAM: LEFT KNEE - COMPLETE 4 VIEW COMPARISON:  None. FINDINGS: No fracture or dislocation is seen. There is soft tissue swelling anterior to the patella. There is possible small effusion in  the suprapatellar bursa. IMPRESSION: No fracture or dislocation is seen. There is soft tissue swelling along the anterior aspect suggesting hematoma or bursitis. There is possible small effusion in the suprapatellar bursa. Electronically Signed   By: Elmer Picker M.D.   On: 02/28/2022 12:11  ? ?ECHOCARDIOGRAM COMPLETE ? ?Result Date: 03/05/2022 ?   ECHOCARDIOGRAM REPORT   Patient Name:   Jason Phillips Date of Exam: 03/05/2022 Medical Rec #:  [id number]           Height:       67.0 in Accession #:    [id number]          Weight:       209.4 lb Date of Birth:  04/19/1981           BSA:          2.062 m? Patient Age:    41 years            BP:           125/82 mmHg Patient Gender: M                   HR:           67 bpm. Exam Location:  Inpatient Procedure: 2D Echo, Cardiac Doppler and Color Doppler Indications:    R01.1 MURMUR  History:        Patient has no prior history of Echocardiogram examinations.                 Risk Factors:Dyslipidemia. GERD.  Sonographer:    Beryle Beams Referring Phys: [id number] [place]  1. Left ventricular ejection fraction, by estimation, is 60 to 65%. The left ventricle has normal function. The left ventricle has  no regional wall motion abnormalities. Left ventricular diastolic parameters were normal.  2. Right ventricular systolic function is normal. The right ventricular size is normal.  3. Left atrial size w

## 2022-03-07 ENCOUNTER — Other Ambulatory Visit: Payer: Self-pay

## 2022-03-07 ENCOUNTER — Ambulatory Visit (INDEPENDENT_AMBULATORY_CARE_PROVIDER_SITE_OTHER): Payer: 59 | Admitting: Family Medicine

## 2022-03-07 ENCOUNTER — Ambulatory Visit (HOSPITAL_BASED_OUTPATIENT_CLINIC_OR_DEPARTMENT_OTHER): Payer: 59 | Admitting: Orthopaedic Surgery

## 2022-03-07 VITALS — Ht 67.0 in

## 2022-03-07 DIAGNOSIS — M7042 Prepatellar bursitis, left knee: Secondary | ICD-10-CM | POA: Diagnosis not present

## 2022-03-07 DIAGNOSIS — R011 Cardiac murmur, unspecified: Secondary | ICD-10-CM | POA: Diagnosis not present

## 2022-03-07 DIAGNOSIS — L03116 Cellulitis of left lower limb: Secondary | ICD-10-CM | POA: Diagnosis not present

## 2022-03-07 NOTE — Patient Instructions (Signed)
Keep taking antibiotic ?If 48 hours after you are off of it, you still have difficulty we will need to expand antibiotics ?Cardio will call you ? ?

## 2022-03-07 NOTE — Assessment & Plan Note (Signed)
Patient does have a new murmur noted.  At this point it does seem to be a 2 out of 6 holosystolic murmur mostly of the left lower sternal border.  Patient was sick enough with elevated lactic acid and new onset murmur that I do think follow-up with cardiology would be appropriate.  Patient did have an echocardiogram showing an abnormality of the mitral valve but otherwise nothing specific for any type of vegetation.  Would like him to evaluate if any other further work-up such as TEE is necessary. ?
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
## 2022-03-07 NOTE — Assessment & Plan Note (Signed)
Patient did have an infected bursitis and was in the emergency room.  With elevated lactic acid concern is that patient was near sepsis.  Patient is responding that seems to the antibiotic at the moment.  Discussed with patient though if when he is done with the medication may need to consider broadening the antibiotic to doxycycline for a longer duration as well.  Patient is making improvement so we can continue to monitor otherwise. ?

## 2022-03-09 LAB — CULTURE, BLOOD (ROUTINE X 2)
Culture: NO GROWTH
Culture: NO GROWTH
Special Requests: ADEQUATE
Special Requests: ADEQUATE

## 2022-03-13 ENCOUNTER — Encounter (HOSPITAL_BASED_OUTPATIENT_CLINIC_OR_DEPARTMENT_OTHER): Payer: Self-pay | Admitting: Orthopaedic Surgery
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
## 2022-03-15 ENCOUNTER — Ambulatory Visit: Payer: 59 | Admitting: Cardiology

## 2022-03-15 ENCOUNTER — Encounter: Payer: Self-pay | Admitting: Cardiology

## 2022-03-15 VITALS — BP 110/74 | HR 58 | Ht 68.0 in | Wt 208.2 lb

## 2022-03-15 DIAGNOSIS — R011 Cardiac murmur, unspecified: Secondary | ICD-10-CM

## 2022-03-15 NOTE — Patient Instructions (Signed)
Medication Instructions:  No changes *If you need a refill on your cardiac medications before your next appointment, please call your pharmacy*   Lab Work: None ordered If you have labs (blood work) drawn today and your tests are completely normal, you will receive your results only by: . MyChart Message (if you have MyChart) OR . A paper copy in the mail If you have any lab test that is abnormal or we need to change your treatment, we will call you to review the results.   Testing/Procedures: None ordered   Follow-Up: At [facility], you and your health needs are our priority.  As part of our continuing mission to provide you with exceptional heart care, we have created designated Provider Care Teams.  These Care Teams include your primary Cardiologist (physician) and Advanced Practice Providers (APPs -  Physician Assistants and Nurse Practitioners) who all work together to provide you with the care you need, when you need it.  We recommend signing up for the patient portal called "MyChart".  Sign up information is provided on this After Visit Summary.  MyChart is used to connect with patients for Virtual Visits (Telemedicine).  Patients are able to view lab/test results, encounter notes, upcoming appointments, etc.  Non-urgent messages can be sent to your provider as well.   To learn more about what you can do with MyChart, go to [email or web address].    Your next appointment:   Follow up as needed with Dr. Hochrein  

## 2022-03-16 ENCOUNTER — Ambulatory Visit (INDEPENDENT_AMBULATORY_CARE_PROVIDER_SITE_OTHER): Payer: 59 | Admitting: Orthopaedic Surgery

## 2022-03-16 DIAGNOSIS — M7042 Prepatellar bursitis, left knee: Secondary | ICD-10-CM | POA: Diagnosis not present
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
## 2022-03-28 ENCOUNTER — Ambulatory Visit: Payer: 59 | Admitting: Family Medicine

## 2022-03-28 VITALS — BP 140/90 | HR 61 | Ht 68.0 in | Wt 205.0 lb

## 2022-03-28 DIAGNOSIS — M9904 Segmental and somatic dysfunction of sacral region: Secondary | ICD-10-CM

## 2022-03-28 DIAGNOSIS — M9903 Segmental and somatic dysfunction of lumbar region: Secondary | ICD-10-CM

## 2022-03-28 DIAGNOSIS — G8929 Other chronic pain: Secondary | ICD-10-CM

## 2022-03-28 DIAGNOSIS — M545 Low back pain, unspecified: Secondary | ICD-10-CM

## 2022-03-28 DIAGNOSIS — M9902 Segmental and somatic dysfunction of thoracic region: Secondary | ICD-10-CM

## 2022-03-28 NOTE — Patient Instructions (Addendum)
Good to see you  ?You popped perfectly ?See me again in 2 months ? ?

## 2022-03-28 NOTE — Assessment & Plan Note (Signed)
Chronic problem with exacerbation.  Patient has responded extremely well though to osteopathic manipulation.  Has noticed a little bit less of the neck pain and focus a little more on the lower back today.  Follow-up with me again in 6 to 8 weeks. ?

## 2022-03-28 NOTE — Assessment & Plan Note (Signed)

## 2022-04-04 ENCOUNTER — Ambulatory Visit (HOSPITAL_BASED_OUTPATIENT_CLINIC_OR_DEPARTMENT_OTHER): Payer: 59 | Admitting: Orthopaedic Surgery

## 2022-04-10 ENCOUNTER — Other Ambulatory Visit: Payer: Self-pay | Admitting: Internal Medicine

## 2022-04-10 DIAGNOSIS — E781 Pure hyperglyceridemia: Secondary | ICD-10-CM

## 2022-04-12 ENCOUNTER — Encounter: Payer: Self-pay | Admitting: Internal Medicine

## 2022-04-12 ENCOUNTER — Ambulatory Visit: Payer: 59 | Admitting: Internal Medicine

## 2022-04-12 ENCOUNTER — Other Ambulatory Visit (HOSPITAL_COMMUNITY): Payer: Self-pay

## 2022-04-12 VITALS — BP 134/88 | HR 55 | Temp 97.9°F | Resp 16 | Ht 68.0 in | Wt 203.0 lb

## 2022-04-12 DIAGNOSIS — Z0000 Encounter for general adult medical examination without abnormal findings: Secondary | ICD-10-CM

## 2022-04-12 DIAGNOSIS — E118 Type 2 diabetes mellitus with unspecified complications: Secondary | ICD-10-CM | POA: Diagnosis not present

## 2022-04-12 DIAGNOSIS — E781 Pure hyperglyceridemia: Secondary | ICD-10-CM

## 2022-04-12 DIAGNOSIS — I10 Essential (primary) hypertension: Secondary | ICD-10-CM

## 2022-04-12 LAB — URINALYSIS, ROUTINE W REFLEX MICROSCOPIC
Bilirubin Urine: NEGATIVE
Hgb urine dipstick: NEGATIVE
Ketones, ur: NEGATIVE
Leukocytes,Ua: NEGATIVE
Nitrite: NEGATIVE
Specific Gravity, Urine: 1.015 (ref 1.000–1.030)
Total Protein, Urine: NEGATIVE
Urine Glucose: NEGATIVE
Urobilinogen, UA: 0.2 (ref 0.0–1.0)
pH: 6 (ref 5.0–8.0)

## 2022-04-12 LAB — BASIC METABOLIC PANEL
BUN: 18 mg/dL (ref 6–23)
CO2: 26 mEq/L (ref 19–32)
Calcium: 10 mg/dL (ref 8.4–10.5)
Chloride: 106 mEq/L (ref 96–112)
Creatinine, Ser: 0.86 mg/dL (ref 0.40–1.50)
GFR: 106.65 mL/min (ref >60.00)
Glucose, Bld: 117 mg/dL — ABNORMAL HIGH (ref 70–99)
Potassium: 4.3 mEq/L (ref 3.5–5.1)
Sodium: 142 mEq/L (ref 135–145)

## 2022-04-12 LAB — MICROALBUMIN / CREATININE URINE RATIO
Creatinine,U: 71.8 mg/dL
Microalb Creat Ratio: 1.1 mg/g (ref 0.0–30.0)
Microalb, Ur: 0.8 mg/dL (ref 0.0–1.9)

## 2022-04-12 LAB — LIPID PANEL
Cholesterol: 207 mg/dL — ABNORMAL HIGH (ref 0–200)
HDL: 45 mg/dL (ref >39.00)
NonHDL: 162.12
Total CHOL/HDL Ratio: 5
Triglycerides: 306 mg/dL — ABNORMAL HIGH (ref 0.0–149.0)
VLDL: 61.2 mg/dL — ABNORMAL HIGH (ref 0.0–40.0)

## 2022-04-12 LAB — HEMOGLOBIN A1C: Hgb A1c MFr Bld: 6.3 % (ref 4.6–6.5)

## 2022-04-12 LAB — LDL CHOLESTEROL, DIRECT: Direct LDL: 103 mg/dL

## 2022-04-12 MED ORDER — FENOFIBRATE 160 MG PO TABS
160.0000 mg | ORAL_TABLET | Freq: Every day | ORAL | 1 refills | Status: DC
  Filled 2022-04-12: qty 90, 90d supply, fill #0
  Filled 2022-07-10: qty 90, 90d supply, fill #1

## 2022-04-12 NOTE — Patient Instructions (Signed)
Health Maintenance, Male Adopting a healthy lifestyle and getting preventive care are important in promoting health and wellness. Ask your health care provider about: The right schedule for you to have regular tests and exams. Things you can do on your own to prevent diseases and keep yourself healthy. What should I know about diet, weight, and exercise? Eat a healthy diet  Eat a diet that includes plenty of vegetables, fruits, low-fat dairy products, and lean protein. Do not eat a lot of foods that are high in solid fats, added sugars, or sodium. Maintain a healthy weight Body mass index (BMI) is a measurement that can be used to identify possible weight problems. It estimates body fat based on height and weight. Your health care provider can help determine your BMI and help you achieve or maintain a healthy weight. Get regular exercise Get regular exercise. This is one of the most important things you can do for your health. Most adults should: Exercise for at least 150 minutes each week. The exercise should increase your heart rate and make you sweat (moderate-intensity exercise). Do strengthening exercises at least twice a week. This is in addition to the moderate-intensity exercise. Spend less time sitting. Even light physical activity can be beneficial. Watch cholesterol and blood lipids Have your blood tested for lipids and cholesterol at 43 years of age, then have this test every 5 years. You may need to have your cholesterol levels checked more often if: Your lipid or cholesterol levels are high. You are older than 43 years of age. You are at high risk for heart disease. What should I know about cancer screening? Many types of cancers can be detected early and may often be prevented. Depending on your health history and family history, you may need to have cancer screening at various ages. This may include screening for: Colorectal cancer. Prostate cancer. Skin cancer. Lung  cancer. What should I know about heart disease, diabetes, and high blood pressure? Blood pressure and heart disease High blood pressure causes heart disease and increases the risk of stroke. This is more likely to develop in people who have high blood pressure readings or are overweight. Talk with your health care provider about your target blood pressure readings. Have your blood pressure checked: Every 3-5 years if you are 18-39 years of age. Every year if you are 40 years old or older. If you are between the ages of 65 and 75 and are a current or former smoker, ask your health care provider if you should have a one-time screening for abdominal aortic aneurysm (AAA). Diabetes Have regular diabetes screenings. This checks your fasting blood sugar level. Have the screening done: Once every three years after age 45 if you are at a normal weight and have a low risk for diabetes. More often and at a younger age if you are overweight or have a high risk for diabetes. What should I know about preventing infection? Hepatitis B If you have a higher risk for hepatitis B, you should be screened for this virus. Talk with your health care provider to find out if you are at risk for hepatitis B infection. Hepatitis C Blood testing is recommended for: Everyone born from 1945 through 1965. Anyone with known risk factors for hepatitis C. Sexually transmitted infections (STIs) You should be screened each year for STIs, including gonorrhea and chlamydia, if: You are sexually active and are younger than 43 years of age. You are older than 43 years of age and your   health care provider tells you that you are at risk for this type of infection. Your sexual activity has changed since you were last screened, and you are at increased risk for chlamydia or gonorrhea. Ask your health care provider if you are at risk. Ask your health care provider about whether you are at high risk for HIV. Your health care provider  may recommend a prescription medicine to help prevent HIV infection. If you choose to take medicine to prevent HIV, you should first get tested for HIV. You should then be tested every 3 months for as long as you are taking the medicine. Follow these instructions at home: Alcohol use Do not drink alcohol if your health care provider tells you not to drink. If you drink alcohol: Limit how much you have to 0-2 drinks a day. Know how much alcohol is in your drink. In the [place] one drink equals one 12 oz bottle of beer (355 mL), one 5 oz glass of wine (148 mL), or one 1 oz glass of hard liquor (44 mL). Lifestyle Do not use any products that contain nicotine or tobacco. These products include cigarettes, chewing tobacco, and vaping devices, such as e-cigarettes. If you need help quitting, ask your health care provider. Do not use street drugs. Do not share needles. Ask your health care provider for help if you need support or information about quitting drugs. General instructions Schedule regular health, dental, and eye exams. Stay current with your vaccines. Tell your health care provider if: You often feel depressed. You have ever been abused or do not feel safe at home. Summary Adopting a healthy lifestyle and getting preventive care are important in promoting health and wellness. Follow your health care provider's instructions about healthy diet, exercising, and getting tested or screened for diseases. Follow your health care provider's instructions on monitoring your cholesterol and blood pressure. This information is not intended to replace advice given to you by your health care provider. Make sure you discuss any questions you have with your health care provider. Document Revised: 04/24/2021 Document Reviewed: 04/24/2021 Elsevier Patient Education  2023 [employer] Inc[employer]  

## 2022-04-12 NOTE — Progress Notes (Signed)
? ?Subjective:  ?Patient ID: Jason Phillips, male    DOB: [birth year]  Age: 41 y.o. MRN: [id number] ? ?CC: Annual Exam, Hypertension, Hyperlipidemia, and Diabetes ? ? ?HPI ?Jason Phillips presents for a CPX and f/up - He has lost weight with lifestyle modifications.  He has felt well recently and offers no complaints. ? ?Outpatient Medications Prior to Visit  ?Medication Sig Dispense Refill  ? blood glucose meter kit and supplies Dispense based on patient and insurance preference. Use up to four times daily as directed. (FOR ICD-10 E10.9, E11.9). 1 each 0  ? diphenhydrAMINE (BENADRYL) 25 MG tablet Take 25 mg by mouth at bedtime.    ? FLUoxetine (PROZAC) 20 MG capsule Take 2 capsules (40 mg total) by mouth daily. 180 capsule 0  ? glucose blood (FREESTYLE LITE) test strip USE TO TEST AS DIRECTED UP TO 4 TIMES DAILY 100 strip 0  ? ibuprofen (ADVIL) 800 MG tablet Take 800 mg by mouth every 8 (eight) hours as needed for headache.    ? omega-3 acid ethyl esters (LOVAZA) 1 g capsule Take 2 capsules (2 g total) by mouth 2 (two) times daily. (Patient taking differently: Take 2 capsules by mouth at bedtime.) 120 capsule 6  ? Pitavastatin Calcium (LIVALO) 2 MG TABS Take 1 tablet (2 mg total) by mouth daily. (Patient taking differently: Take 2 mg by mouth at bedtime.) 90 tablet 1  ? SUMAtriptan (IMITREX) 100 MG tablet Take 0.5-1 tablets (50-100 mg total) by mouth every 2 (two) hours as needed for migraine 9 tablet 1  ? zolpidem (AMBIEN) 5 MG tablet Take 5 mg by mouth at bedtime as needed for sleep.    ? cefadroxil (DURICEF) 500 MG capsule Take 1 capsule (500 mg total) by mouth 2 (two) times daily. 19 capsule 0  ? COVID-19 At Home Antigen Test [facility] COVID-19 HOME TEST) KIT Use as directed 4 each 0  ? fenofibrate 160 MG tablet Take 1 tablet (160 mg total) by mouth daily. 90 tablet 1  ? ?No facility-administered medications prior to visit.  ? ? ?ROS ?Review of Systems  ?Constitutional:  Negative for diaphoresis and  fatigue.  ?HENT: Negative.    ?Eyes: Negative.   ?Respiratory:  Negative for cough, chest tightness, shortness of breath and wheezing.   ?Cardiovascular:  Negative for chest pain, palpitations and leg swelling.  ?Gastrointestinal:  Negative for abdominal pain, constipation, diarrhea and vomiting.  ?Endocrine: Negative.   ?Genitourinary: Negative.   ?Musculoskeletal: Negative.  Negative for myalgias.  ?Skin: Negative.  Negative for color change and pallor.  ?Neurological:  Negative for dizziness, weakness and headaches.  ?Hematological:  Negative for adenopathy. Does not bruise/bleed easily.  ?Psychiatric/Behavioral: Negative.    ? ?Objective:  ?BP 134/88 (BP Location: Right Arm, Patient Position: Sitting, Cuff Size: Large)   Pulse (!) 55   Temp 97.9 ?F (36.6 ?C) (Oral)   Resp 16   Ht 5' 8"  (1.727 m)   Wt 203 lb (92.1 kg)   SpO2 96%   BMI 30.87 kg/m?  ? ?BP Readings from Last 3 Encounters:  ?04/12/22 134/88  ?03/28/22 140/90  ?03/15/22 110/74  ? ? ?Wt Readings from Last 3 Encounters:  ?04/12/22 203 lb (92.1 kg)  ?03/28/22 205 lb (93 kg)  ?03/15/22 208 lb 3.2 oz (94.4 kg)  ? ? ?Physical Exam ?Vitals reviewed.  ?HENT:  ?   Nose: Nose normal.  ?   Mouth/Throat:  ?   Mouth: Mucous membranes are moist.  ?Eyes:  ?  General: No scleral icterus. ?   Conjunctiva/sclera: Conjunctivae normal.  ?Cardiovascular:  ?   Rate and Rhythm: Normal rate and regular rhythm.  ?   Heart sounds: No murmur heard. ?Pulmonary:  ?   Effort: Pulmonary effort is normal.  ?   Breath sounds: No stridor. No wheezing, rhonchi or rales.  ?Abdominal:  ?   General: Abdomen is flat.  ?   Palpations: There is no mass.  ?   Tenderness: There is no abdominal tenderness. There is no guarding.  ?   Hernia: No hernia is present.  ?Musculoskeletal:     ?   General: Normal range of motion.  ?   Cervical back: Neck supple.  ?   Right lower leg: No edema.  ?   Left lower leg: No edema.  ?Lymphadenopathy:  ?   Cervical: No cervical adenopathy.  ?Skin: ?    General: Skin is warm and dry.  ?Neurological:  ?   General: No focal deficit present.  ?   Mental Status: He is alert.  ?Psychiatric:     ?   Mood and Affect: Mood normal.     ?   Behavior: Behavior normal.  ? ? ?Lab Results  ?Component Value Date  ? WBC 7.6 03/04/2022  ? HGB 12.9 (L) 03/04/2022  ? HCT 38.2 (L) 03/04/2022  ? PLT 354 03/04/2022  ? GLUCOSE 117 (H) 04/12/2022  ? CHOL 207 (H) 04/12/2022  ? TRIG 306.0 (H) 04/12/2022  ? HDL 45.00 04/12/2022  ? LDLDIRECT 103.0 04/12/2022  ? LDLCALC 124 (H) 02/19/2020  ? ALT 43 03/04/2022  ? AST 28 03/04/2022  ? NA 142 04/12/2022  ? K 4.3 04/12/2022  ? CL 106 04/12/2022  ? CREATININE 0.86 04/12/2022  ? BUN 18 04/12/2022  ? CO2 26 04/12/2022  ? TSH 2.33 05/15/2016  ? INR 1.0 03/28/2008  ? HGBA1C 6.3 04/12/2022  ? MICROALBUR 0.8 04/12/2022  ? ? ?DG Chest Portable 1 View ? ?Result Date: 03/04/2022 ?CLINICAL DATA:  Sepsis workup.  Left knee drained 4 days ago. EXAM: PORTABLE CHEST 1 VIEW COMPARISON:  December 11, 2017 FINDINGS: The heart size and mediastinal contours are within normal limits. Both lungs are clear. The visualized skeletal structures are unremarkable. IMPRESSION: No active disease. Electronically Signed   By: Dorise Bullion III M.D.   On: 03/04/2022 16:13  ? ?DG Knee Complete 4 Views Left ? ?Result Date: 03/04/2022 ?CLINICAL DATA:  LEFT knee pain and fever. LEFT knee drained 4 days ago. Initial encounter. EXAM: LEFT KNEE - COMPLETE 4+ VIEW COMPARISON:  02/28/2022 radiograph FINDINGS: There is no evidence of acute fracture, subluxation or dislocation. No radiographic evidence of acute osteomyelitis noted. Prepatellar soft tissue swelling is again noted. An equivocal small effusion in the suprapatellar bursa may be present. IMPRESSION: 1. Prepatellar soft tissue swelling and equivocal small knee effusion. 2. No evidence of acute bony abnormality or radiographic evidence of acute osteomyelitis. 3. No other significant change. Electronically Signed   By: Margarette Canada  M.D.   On: 03/04/2022 14:15  ? ?ECHOCARDIOGRAM COMPLETE ? ?Result Date: 03/05/2022 ?   ECHOCARDIOGRAM REPORT   Patient Name:   Jason Phillips Date of Exam: 03/05/2022 Medical Rec #:  [id number]           Height:       67.0 in Accession #:    [id number]          Weight:       209.4 lb Date of Birth:  09/26/1981           BSA:          2.062 m? Patient Age:    24 years            BP:           125/82 mmHg Patient Gender: M                   HR:           67 bpm. Exam Location:  Inpatient Procedure: 2D Echo, Cardiac Doppler and Color Doppler Indications:    R01.1 MURMUR  History:        Patient has no prior history of Echocardiogram examinations.                 Risk Factors:Dyslipidemia. GERD.  Sonographer:    Beryle Beams Referring Phys: [id number]  1. Left ventricular ejection fraction, by estimation, is 60 to 65%. The left ventricle has normal function. The left ventricle has no regional wall motion abnormalities. Left ventricular diastolic parameters were normal.  2. Right ventricular systolic function is normal. The right ventricular size is normal.  3. Left atrial size was moderately dilated.  4. The mitral valve is abnormal. Trivial mitral valve regurgitation. No evidence of mitral stenosis.  5. Mean gradient across AV 6 mmHg AVA > 2cm2 . The aortic valve is tricuspid. There is mild calcification of the aortic valve. There is mild thickening of the aortic valve. Aortic valve regurgitation is not visualized. Aortic valve sclerosis/calcification is present, without any evidence of aortic stenosis.  6. The inferior vena cava is normal in size with greater than 50% respiratory variability, suggesting right atrial pressure of 3 mmHg. FINDINGS  Left Ventricle: Left ventricular ejection fraction, by estimation, is 60 to 65%. The left ventricle has normal function. The left ventricle has no regional wall motion abnormalities. The left ventricular internal cavity size was normal in size. There is   no left ventricular hypertrophy. Left ventricular diastolic parameters were normal. Right Ventricle: The right ventricular size is normal. No increase in right ventricular wall thickness. Right ventricular systol

## 2022-04-18 ENCOUNTER — Other Ambulatory Visit (HOSPITAL_COMMUNITY): Payer: Self-pay

## 2022-05-03 ENCOUNTER — Other Ambulatory Visit (HOSPITAL_COMMUNITY): Payer: Self-pay

## 2022-05-03 ENCOUNTER — Other Ambulatory Visit: Payer: Self-pay | Admitting: Internal Medicine

## 2022-05-03 DIAGNOSIS — F39 Unspecified mood [affective] disorder: Secondary | ICD-10-CM

## 2022-05-03 MED ORDER — FLUOXETINE HCL 20 MG PO CAPS
40.0000 mg | ORAL_CAPSULE | Freq: Every day | ORAL | 0 refills | Status: DC
  Filled 2022-05-03: qty 180, 90d supply, fill #0

## 2022-05-21 NOTE — Progress Notes (Unsigned)
[facility] [address] [place] [address] Phone: [phone] Subjective:   [name], am serving as a scribe for Dr. Antoine Primas.   I'm seeing this patient by the request  of:  Etta Grandchild, MD  CC: Low back and neck pain follow-up  Jason Phillips  Jason Phillips is a 41 y.o. male coming in with complaint of back and neck pain. OMT 03/28/2022. Patient states that the lower left side of back is in spasm. Denies any radiating symptoms. Taking IBU.  Patient states that it is fairly uncomfortable.  Denies any radiation down the legs though.  Denies any weakness.  States that it is fairly severe and can even wake him up at night.  Took his wife's muscle relaxer that was helpful.  Medications patient has been prescribed: None  Taking:         Reviewed prior external information including notes and imaging from previsou exam, outside providers and external EMR if available.   As well as notes that were available from care everywhere and other healthcare systems.  Past medical history, social, surgical and family history all reviewed in electronic medical record.  No pertanent information unless stated regarding to the chief complaint.   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 POSITIVE muscle aches  Objective  Blood  pressure 118/88, pulse 61, height 5\' 8"  (1.727 m), weight 208 lb (94.3 kg), SpO2 98 %.   General: No apparent distress alert and oriented x3 mood and affect normal, dressed appropriately.  HEENT: Pupils equal, extraocular movements intact  Respiratory: Patient's speak in full sentences and does not appear short of breath  Cardiovascular: No lower extremity edema, non tender, no erythema  Low back exam shows tightness noted in the paraspinal musculature bilaterally left greater than right.  Positive Faber on the left side.  Lacks the last 5 degrees of flexion and extension.  Limited rotation secondary to pain  Osteopathic findings  C2 flexed rotated and side bent right C4 flexed rotated and side bent left T4 extended rotated and side bent right inhaled rib T8 extended rotated and side bent left L2 flexed rotated and side bent left Sacrum left on left       Assessment and Plan:  Lumbar paraspinal muscle spasm Patient does have more of a lumbar spasm noted.  Discussed icing regimen and home exercises.  Acute on chronic problem.  Worsening.  Zanaflex given.  Responded well to manipulation.  Follow-up again in 4 to 8 weeks   Nonallopathic problems  Decision today to treat with OMT was based on Physical Exam  After verbal consent patient was treated with HVLA, ME, FPR techniques in cervical, rib, thoracic, lumbar, and sacral  areas  Patient tolerated the procedure well with improvement in symptoms  Patient given exercises, stretches and lifestyle modifications  See medications in patient instructions if given  Patient will follow up in 4-8 weeks     The above documentation has been reviewed and is accurate and complete Judi Saa, DO        Note: This dictation was prepared with Dragon dictation along with smaller phrase technology. Any transcriptional errors that result from this process are unintentional.

## 2022-05-22 ENCOUNTER — Other Ambulatory Visit (HOSPITAL_COMMUNITY): Payer: Self-pay

## 2022-05-22 ENCOUNTER — Ambulatory Visit: Payer: 59 | Admitting: Family Medicine

## 2022-05-22 VITALS — BP 118/88 | HR 61 | Ht 68.0 in | Wt 208.0 lb

## 2022-05-22 DIAGNOSIS — M9904 Segmental and somatic dysfunction of sacral region: Secondary | ICD-10-CM

## 2022-05-22 DIAGNOSIS — M9903 Segmental and somatic dysfunction of lumbar region: Secondary | ICD-10-CM | POA: Diagnosis not present

## 2022-05-22 DIAGNOSIS — M9901 Segmental and somatic dysfunction of cervical region: Secondary | ICD-10-CM

## 2022-05-22 DIAGNOSIS — M9908 Segmental and somatic dysfunction of rib cage: Secondary | ICD-10-CM

## 2022-05-22 DIAGNOSIS — M9902 Segmental and somatic dysfunction of thoracic region: Secondary | ICD-10-CM | POA: Diagnosis not present

## 2022-05-22 DIAGNOSIS — M62830 Muscle spasm of back: Secondary | ICD-10-CM | POA: Diagnosis not present

## 2022-05-22 MED ORDER — TIZANIDINE HCL 2 MG PO TABS
2.0000 mg | ORAL_TABLET | Freq: Every day | ORAL | 0 refills | Status: DC
Start: 2022-05-22 — End: 2022-08-17
  Filled 2022-05-22: qty 30, 30d supply, fill #0

## 2022-05-22 NOTE — Assessment & Plan Note (Signed)
Patient does have more of a lumbar spasm noted.  Discussed icing regimen and home exercises.  Acute on chronic problem.  Worsening.  Zanaflex given.  Responded well to manipulation.  Follow-up again in 4 to 8 weeks

## 2022-05-22 NOTE — Patient Instructions (Signed)
Zanaflex 2mg  at night Take for next 3 nights then as needed See me again in 5-7 weeks

## 2022-05-28 ENCOUNTER — Other Ambulatory Visit: Payer: Self-pay | Admitting: Internal Medicine

## 2022-05-28 ENCOUNTER — Other Ambulatory Visit (HOSPITAL_COMMUNITY): Payer: Self-pay

## 2022-05-28 DIAGNOSIS — E785 Hyperlipidemia, unspecified: Secondary | ICD-10-CM

## 2022-05-28 MED ORDER — LIVALO 2 MG PO TABS
1.0000 | ORAL_TABLET | Freq: Every day | ORAL | 1 refills | Status: DC
  Filled 2022-05-28: qty 90, 90d supply, fill #0
  Filled 2022-09-03: qty 90, 90d supply, fill #1

## 2022-06-22 ENCOUNTER — Other Ambulatory Visit (HOSPITAL_COMMUNITY): Payer: Self-pay

## 2022-07-02 NOTE — Progress Notes (Unsigned)
[facility] [address] [place] [address] Phone: [phone] Subjective:   INadine Counts, am serving as a scribe for Dr. Antoine Primas.  I'm seeing this patient by the request  of:  Etta Grandchild, MD  CC: Neck and back pain follow-up  MPN:[id number]  [name] is a 41 y.o. male coming in with complaint of back and neck pain. OMT  05/22/2022. Patient states same per usual. No new complaints.  Patient continues to be relatively active.  Since we have seen patient patient has done well with the muscle laxatives intermittently.  Medications patient has been prescribed: Zanaflex  Taking:         Reviewed prior external information including notes and imaging from previsou exam, outside providers and external EMR if available.   As well as notes that were available from care everywhere and other healthcare systems.  Past medical history, social, surgical and family history all reviewed in electronic medical record.  No pertanent information unless stated regarding to the chief complaint.   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 POSITIVE muscle aches  Objective  Blood pressure (!) 136/92, pulse 69, height 5\' 8"  (1.727 m), weight 210 lb (95.3 kg), SpO2 98 %.   General: No apparent distress alert and oriented x3 mood and  affect normal, dressed appropriately.  HEENT: Pupils equal, extraocular movements intact  Respiratory: Patient's speak in full sentences and does not appear short of breath  Cardiovascular: No lower extremity edema, non tender, no erythema  Gait MSK:  Back does have some loss of lordosis.  Some tenderness to palpations over the paraspinal musculature. Patient does have some  Osteopathic findings  C2 flexed rotated and side bent right C flexed rotated and side bent right T3 extended rotated and side bent right inhaled rib T extended rotated and side bent left L2 flexed rotated and side bent right Sacrum right on right     Assessment and Plan:  Lumbar paraspinal muscle spasm Continues to have tightness of her elbow.  Patient is responding extremely well to the osteopathic evaluation.  Discussed which activities to do Stable.  Follow-up again in 6 to 8 weeks.    Nonallopathic problems  Decision today to treat with OMT was based on Physical Exam  After verbal consent patient was treated with HVLA, ME, FPR techniques in cervical, rib, thoracic, lumbar, and sacral  areas  Patient tolerated the procedure well with improvement in symptoms  Patient given exercises, stretches and lifestyle modifications  See medications in patient instructions if given  Patient will follow up in 4-8 weeks     The above documentation has been reviewed and is accurate and complete , DO         Note: This dictation was prepared  with Dragon dictation along with smaller phrase technology. Any transcriptional errors that result from this process are unintentional.

## 2022-07-03 ENCOUNTER — Ambulatory Visit: Payer: 59 | Admitting: Family Medicine

## 2022-07-03 VITALS — BP 136/92 | HR 69 | Ht 68.0 in | Wt 210.0 lb

## 2022-07-03 DIAGNOSIS — M9902 Segmental and somatic dysfunction of thoracic region: Secondary | ICD-10-CM | POA: Diagnosis not present

## 2022-07-03 DIAGNOSIS — M9908 Segmental and somatic dysfunction of rib cage: Secondary | ICD-10-CM

## 2022-07-03 DIAGNOSIS — M62830 Muscle spasm of back: Secondary | ICD-10-CM | POA: Diagnosis not present

## 2022-07-03 DIAGNOSIS — M9904 Segmental and somatic dysfunction of sacral region: Secondary | ICD-10-CM

## 2022-07-03 DIAGNOSIS — M9903 Segmental and somatic dysfunction of lumbar region: Secondary | ICD-10-CM | POA: Diagnosis not present

## 2022-07-03 DIAGNOSIS — M9901 Segmental and somatic dysfunction of cervical region: Secondary | ICD-10-CM | POA: Diagnosis not present

## 2022-07-03 NOTE — Assessment & Plan Note (Signed)
Continues to have tightness of her elbow.  Patient is responding extremely well to the osteopathic evaluation.  Discussed which activities to do Stable.  Follow-up again in 6 to 8 weeks.

## 2022-07-03 NOTE — Patient Instructions (Signed)
Good to see you! Ibuprofen and muscle relaxer as needed Still think you're doing a great job See you again in 2-3 months

## 2022-07-10 ENCOUNTER — Other Ambulatory Visit (HOSPITAL_COMMUNITY): Payer: Self-pay

## 2022-08-02 ENCOUNTER — Other Ambulatory Visit: Payer: Self-pay | Admitting: Internal Medicine

## 2022-08-02 ENCOUNTER — Other Ambulatory Visit (HOSPITAL_COMMUNITY): Payer: Self-pay

## 2022-08-02 DIAGNOSIS — F39 Unspecified mood [affective] disorder: Secondary | ICD-10-CM

## 2022-08-02 MED ORDER — FLUOXETINE HCL 20 MG PO CAPS
40.0000 mg | ORAL_CAPSULE | Freq: Every day | ORAL | 0 refills | Status: DC
  Filled 2022-08-02: qty 180, 90d supply, fill #0

## 2022-08-17 ENCOUNTER — Other Ambulatory Visit (HOSPITAL_COMMUNITY): Payer: Self-pay

## 2022-08-17 ENCOUNTER — Other Ambulatory Visit: Payer: Self-pay | Admitting: Family Medicine

## 2022-08-17 ENCOUNTER — Encounter: Payer: Self-pay | Admitting: Family Medicine

## 2022-08-17 MED ORDER — TIZANIDINE HCL 2 MG PO TABS
2.0000 mg | ORAL_TABLET | Freq: Every day | ORAL | 0 refills | Status: DC
Start: 2022-08-17 — End: 2023-02-13
  Filled 2022-08-17: qty 30, 30d supply, fill #0

## 2022-08-23 ENCOUNTER — Other Ambulatory Visit (HOSPITAL_COMMUNITY): Payer: Self-pay

## 2022-09-03 ENCOUNTER — Other Ambulatory Visit (HOSPITAL_COMMUNITY): Payer: Self-pay

## 2022-09-05 ENCOUNTER — Other Ambulatory Visit (HOSPITAL_COMMUNITY): Payer: Self-pay

## 2022-09-10 NOTE — Progress Notes (Unsigned)
[facility] [place] [address] [place] Phone: [phone] Subjective:   [name], am serving as a scribe for Dr. Hulan Saas.  I'm seeing this patient by the request  of:  [name], MD  CC: Back and neck pain follow-up  EUM:[id number]  HESTON WIDENER is a 41 y.o. male coming in with complaint of back and neck pain. OMT 07/03/2022. Patient states same per usual. Having some LBP today. No other issues.  Medications patient has been prescribed: Zanaflex  Taking:         Reviewed prior external information including notes and imaging from previsou exam, outside providers and external EMR if available.   As well as notes that were available from care everywhere and other healthcare systems.  Past medical history, social, surgical and family history all reviewed in electronic medical record.  No pertanent information unless stated regarding to the chief complaint.   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 POSITIVE muscle aches  Objective  Blood pressure 126/86, pulse 70, height 5\' 8"  (1.727 m), weight 210 lb (95.3 kg), SpO2 96 %.   General: No apparent distress alert and oriented x3 mood and affect normal, dressed appropriately.  HEENT: Pupils equal, extraocular movements intact  Respiratory: Patient's speak in  full sentences and does not appear short of breath  Cardiovascular: No lower extremity edema, non tender, no erythema  Gait normal MSK:  Back does have significant tightness noted in the paraspinal musculature right greater than left.  Patient does have tenderness to palpation of the right side without spasm of the hip flexor on the right side.  Negative straight leg test  Osteopathic findings  C5 flexed rotated and side bent left T5 extended rotated and side bent right inhaled rib L2 flexed rotated and side bent right L5 flexed rotated and side bent right Sacrum right on right       Assessment and Plan:  Lumbar paraspinal muscle spasm Patient is doing more repetitive motions are likely contributing to some more of the discomfort and pain.  Discussed core stability and core strengthening again.  We will continue with the exercises.  Patient has muscle relaxer for extremity exacerbation of the pain.  We discussed worsening pain will continue to monitor.  Follow-up with me again in 6 to 8 weeks otherwise.    Nonallopathic problems  Decision today to treat with OMT was based on Physical Exam  After verbal consent patient was treated with HVLA, ME, FPR techniques in cervical, rib, thoracic, lumbar, and sacral  areas  Patient tolerated the procedure well with improvement in symptoms  Patient given exercises, stretches and lifestyle modifications  See medications in patient instructions if given  Patient will follow up in 4-8 weeks     The  above documentation has been reviewed and is accurate and complete [name], DO         Note: This dictation was prepared with Dragon dictation along with smaller phrase technology. Any transcriptional errors that result from this process are unintentional.

## 2022-09-11 ENCOUNTER — Ambulatory Visit: Payer: 59 | Admitting: Family Medicine

## 2022-09-11 ENCOUNTER — Encounter: Payer: Self-pay | Admitting: Family Medicine

## 2022-09-11 VITALS — BP 126/86 | HR 70 | Ht 68.0 in | Wt 210.0 lb

## 2022-09-11 DIAGNOSIS — M9903 Segmental and somatic dysfunction of lumbar region: Secondary | ICD-10-CM | POA: Diagnosis not present

## 2022-09-11 DIAGNOSIS — M9908 Segmental and somatic dysfunction of rib cage: Secondary | ICD-10-CM | POA: Diagnosis not present

## 2022-09-11 DIAGNOSIS — M9902 Segmental and somatic dysfunction of thoracic region: Secondary | ICD-10-CM | POA: Diagnosis not present

## 2022-09-11 DIAGNOSIS — M9901 Segmental and somatic dysfunction of cervical region: Secondary | ICD-10-CM | POA: Diagnosis not present

## 2022-09-11 DIAGNOSIS — M62830 Muscle spasm of back: Secondary | ICD-10-CM | POA: Diagnosis not present

## 2022-09-11 DIAGNOSIS — M9904 Segmental and somatic dysfunction of sacral region: Secondary | ICD-10-CM

## 2022-09-11 NOTE — Assessment & Plan Note (Signed)
Patient is doing more repetitive motions are likely contributing to some more of the discomfort and pain.  Discussed core stability and core strengthening again.  We will continue with the exercises.  Patient has muscle relaxer for extremity exacerbation of the pain.  We discussed worsening pain will continue to monitor.  Follow-up with me again in 6 to 8 weeks otherwise.

## 2022-09-11 NOTE — Patient Instructions (Signed)
Good to see you! Try to massage that spot out Muscle relaxer for the next 3 nights

## 2022-10-09 ENCOUNTER — Other Ambulatory Visit (HOSPITAL_COMMUNITY): Payer: Self-pay

## 2022-10-09 ENCOUNTER — Other Ambulatory Visit: Payer: Self-pay | Admitting: Internal Medicine

## 2022-10-09 DIAGNOSIS — E781 Pure hyperglyceridemia: Secondary | ICD-10-CM

## 2022-10-09 MED ORDER — FENOFIBRATE 160 MG PO TABS
160.0000 mg | ORAL_TABLET | Freq: Every day | ORAL | 0 refills | Status: DC
  Filled 2022-10-09: qty 90, 90d supply, fill #0

## 2022-10-11 NOTE — Progress Notes (Signed)
[place] Phone: [phone] Subjective:   Fontaine No, am serving as a scribe for Dr. Hulan Saas.  I'm seeing this patient by the request  of:  [name], MD  CC: back and neck exam    KGY:[id number]  RAYNER ERMAN is a 41 y.o. male coming in with complaint of back and neck pain. OMT 09/11/2022. Patient states his back pain is doing well since last visit. Used zanaflex only once since last visit.   Medications patient has been prescribed: Zanaflex  Taking:         Reviewed prior external information including notes and imaging from previsou exam, outside providers and external EMR if available.   As well as notes that were available from care everywhere and other healthcare systems.  Past medical history, social, surgical and family history all reviewed in electronic medical record.  No pertanent information unless stated regarding to the chief complaint.   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 POSITIVE muscle aches  Objective  Blood pressure 118/82, pulse 79, height 5\' 8"  (1.727 m), weight 210 lb (95.3 kg), SpO2 98 %.   General: No apparent distress alert and oriented x3 mood and affect normal, dressed appropriately.  HEENT: Pupils equal, extraocular movements intact   Respiratory: Patient's speak in full sentences and does not appear short of breath  Cardiovascular: No lower extremity edema, non tender, no erythema  MSK:  Back   Osteopathic findings  C2 flexed rotated and side bent right C7 flexed rotated and side bent left T3 extended rotated and side bent right inhaled rib T8 extended rotated and side bent left L3 flexed rotated and side bent right Sacrum right on right       Assessment and Plan:  Lumbar paraspinal muscle spasm Discussed HEP, core stability chronic problem with mild exacerbation with patient doing a lot more repetitive activity Ibuprofen 800mg  TID PRN   Continue to do home exercises and core strengthening Zanaflex 2 mg at night. Follow-up for reeval in 6 to 8 weeks.    Nonallopathic problems  Decision today to treat with OMT was based on Physical Exam  After verbal consent patient was treated with HVLA, ME, FPR techniques in cervical, rib, thoracic, lumbar, and sacral  areas  Patient tolerated the procedure well with improvement in symptoms  Patient given exercises, stretches and lifestyle modifications  See medications in patient instructions if given  Patient will follow up in 4-8 weeks    The above documentation has been reviewed and is accurate and complete [name], DO          Note: This dictation was prepared with Dragon dictation along with smaller phrase technology. Any transcriptional errors that result from this process are  unintentional.

## 2022-10-15 ENCOUNTER — Ambulatory Visit: Payer: 59 | Admitting: Family Medicine

## 2022-10-15 VITALS — BP 118/82 | HR 79 | Ht 68.0 in | Wt 210.0 lb

## 2022-10-15 DIAGNOSIS — M9908 Segmental and somatic dysfunction of rib cage: Secondary | ICD-10-CM

## 2022-10-15 DIAGNOSIS — M9902 Segmental and somatic dysfunction of thoracic region: Secondary | ICD-10-CM | POA: Diagnosis not present

## 2022-10-15 DIAGNOSIS — M62830 Muscle spasm of back: Secondary | ICD-10-CM | POA: Diagnosis not present

## 2022-10-15 DIAGNOSIS — M9904 Segmental and somatic dysfunction of sacral region: Secondary | ICD-10-CM | POA: Diagnosis not present

## 2022-10-15 DIAGNOSIS — M9903 Segmental and somatic dysfunction of lumbar region: Secondary | ICD-10-CM | POA: Diagnosis not present

## 2022-10-15 DIAGNOSIS — M9901 Segmental and somatic dysfunction of cervical region: Secondary | ICD-10-CM

## 2022-10-15 NOTE — Assessment & Plan Note (Signed)
Discussed HEP, core stability chronic problem with mild exacerbation with patient doing a lot more repetitive activity Ibuprofen 800mg  TID PRN   Continue to do home exercises and core strengthening Zanaflex 2 mg at night. Follow-up for reeval in 6 to 8 weeks.

## 2022-10-15 NOTE — Patient Instructions (Signed)
See me in 6-8 weeks 

## 2022-10-25 ENCOUNTER — Other Ambulatory Visit: Payer: Self-pay | Admitting: Internal Medicine

## 2022-10-25 DIAGNOSIS — E781 Pure hyperglyceridemia: Secondary | ICD-10-CM

## 2022-10-25 DIAGNOSIS — F39 Unspecified mood [affective] disorder: Secondary | ICD-10-CM

## 2022-10-25 DIAGNOSIS — G43409 Hemiplegic migraine, not intractable, without status migrainosus: Secondary | ICD-10-CM

## 2022-10-25 DIAGNOSIS — E785 Hyperlipidemia, unspecified: Secondary | ICD-10-CM

## 2022-10-26 ENCOUNTER — Other Ambulatory Visit (HOSPITAL_COMMUNITY): Payer: Self-pay

## 2022-10-26 ENCOUNTER — Other Ambulatory Visit: Payer: Self-pay | Admitting: Internal Medicine

## 2022-10-26 DIAGNOSIS — F39 Unspecified mood [affective] disorder: Secondary | ICD-10-CM

## 2022-10-29 ENCOUNTER — Other Ambulatory Visit: Payer: Self-pay | Admitting: Internal Medicine

## 2022-10-29 ENCOUNTER — Other Ambulatory Visit (HOSPITAL_COMMUNITY): Payer: Self-pay

## 2022-10-29 ENCOUNTER — Encounter: Payer: Self-pay | Admitting: Internal Medicine

## 2022-10-29 DIAGNOSIS — E781 Pure hyperglyceridemia: Secondary | ICD-10-CM

## 2022-11-05 ENCOUNTER — Encounter: Payer: Self-pay | Admitting: Internal Medicine

## 2022-11-05 ENCOUNTER — Other Ambulatory Visit (HOSPITAL_COMMUNITY): Payer: Self-pay

## 2022-11-05 ENCOUNTER — Ambulatory Visit: Payer: 59 | Admitting: Internal Medicine

## 2022-11-05 VITALS — BP 132/86 | HR 73 | Temp 98.3°F | Resp 16 | Ht 68.0 in | Wt 208.0 lb

## 2022-11-05 DIAGNOSIS — D539 Nutritional anemia, unspecified: Secondary | ICD-10-CM | POA: Diagnosis not present

## 2022-11-05 DIAGNOSIS — K219 Gastro-esophageal reflux disease without esophagitis: Secondary | ICD-10-CM | POA: Diagnosis not present

## 2022-11-05 DIAGNOSIS — R7303 Prediabetes: Secondary | ICD-10-CM | POA: Diagnosis not present

## 2022-11-05 DIAGNOSIS — F5104 Psychophysiologic insomnia: Secondary | ICD-10-CM

## 2022-11-05 DIAGNOSIS — I10 Essential (primary) hypertension: Secondary | ICD-10-CM | POA: Diagnosis not present

## 2022-11-05 DIAGNOSIS — E785 Hyperlipidemia, unspecified: Secondary | ICD-10-CM

## 2022-11-05 DIAGNOSIS — F39 Unspecified mood [affective] disorder: Secondary | ICD-10-CM

## 2022-11-05 DIAGNOSIS — G43109 Migraine with aura, not intractable, without status migrainosus: Secondary | ICD-10-CM

## 2022-11-05 DIAGNOSIS — E781 Pure hyperglyceridemia: Secondary | ICD-10-CM

## 2022-11-05 DIAGNOSIS — R0683 Snoring: Secondary | ICD-10-CM

## 2022-11-05 DIAGNOSIS — E118 Type 2 diabetes mellitus with unspecified complications: Secondary | ICD-10-CM

## 2022-11-05 LAB — LIPID PANEL
Cholesterol: 228 mg/dL — ABNORMAL HIGH (ref 0–200)
HDL: 46.7 mg/dL (ref >39.00)
NonHDL: 180.94
Total CHOL/HDL Ratio: 5
Triglycerides: 328 mg/dL — ABNORMAL HIGH (ref 0.0–149.0)
VLDL: 65.6 mg/dL — ABNORMAL HIGH (ref 0.0–40.0)

## 2022-11-05 LAB — CBC WITH DIFFERENTIAL/PLATELET
Basophils Absolute: 0.1 10*3/uL (ref 0.0–0.1)
Basophils Relative: 1.1 % (ref 0.0–3.0)
Eosinophils Absolute: 0.2 10*3/uL (ref 0.0–0.7)
Eosinophils Relative: 2.8 % (ref 0.0–5.0)
HCT: 39.7 % (ref 39.0–52.0)
Hemoglobin: 13.5 g/dL (ref 13.0–17.0)
Lymphocytes Relative: 38.8 % (ref 12.0–46.0)
Lymphs Abs: 2.6 10*3/uL (ref 0.7–4.0)
MCHC: 34.1 g/dL (ref 30.0–36.0)
MCV: 89 fl (ref 78.0–100.0)
Monocytes Absolute: 0.4 10*3/uL (ref 0.1–1.0)
Monocytes Relative: 5.6 % (ref 3.0–12.0)
Neutro Abs: 3.4 10*3/uL (ref 1.4–7.7)
Neutrophils Relative %: 51.7 % (ref 43.0–77.0)
Platelets: 325 10*3/uL (ref 150.0–400.0)
RBC: 4.46 Mil/uL (ref 4.22–5.81)
RDW: 12.9 % (ref 11.5–15.5)
WBC: 6.6 10*3/uL (ref 4.0–10.5)

## 2022-11-05 LAB — IBC + FERRITIN
Ferritin: 175.2 ng/mL (ref 22.0–322.0)
Iron: 149 ug/dL (ref 42–165)
Saturation Ratios: 31 % (ref 20.0–50.0)
TIBC: 480.2 ug/dL — ABNORMAL HIGH (ref 250.0–450.0)
Transferrin: 343 mg/dL (ref 212.0–360.0)

## 2022-11-05 LAB — LDL CHOLESTEROL, DIRECT: Direct LDL: 131 mg/dL

## 2022-11-05 LAB — VITAMIN B12: Vitamin B-12: 333 pg/mL (ref 211–911)

## 2022-11-05 LAB — HEMOGLOBIN A1C: Hgb A1c MFr Bld: 6.7 % — ABNORMAL HIGH (ref 4.6–6.5)

## 2022-11-05 LAB — FOLATE: Folate: >23.8 ng/mL (ref >5.9)

## 2022-11-05 MED ORDER — UBRELVY 100 MG PO TABS
1.0000 | ORAL_TABLET | ORAL | 1 refills | Status: DC | PRN
  Filled 2022-11-05: qty 8, 30d supply, fill #0
  Filled 2023-02-20: qty 8, 30d supply, fill #1

## 2022-11-05 MED ORDER — OMEGA-3-ACID ETHYL ESTERS 1 G PO CAPS
2.0000 | ORAL_CAPSULE | Freq: Two times a day (BID) | ORAL | 1 refills | Status: DC
  Filled 2022-11-05: qty 360, 90d supply, fill #0

## 2022-11-05 MED ORDER — ZOLPIDEM TARTRATE 5 MG PO TABS
5.0000 mg | ORAL_TABLET | Freq: Every evening | ORAL | 1 refills | Status: DC | PRN
  Filled 2022-11-05: qty 90, 90d supply, fill #0

## 2022-11-05 MED ORDER — FLUOXETINE HCL 20 MG PO CAPS
40.0000 mg | ORAL_CAPSULE | Freq: Every day | ORAL | 1 refills | Status: DC
  Filled 2022-11-05 – 2023-02-04 (×2): qty 180, 90d supply, fill #0

## 2022-11-05 NOTE — Patient Instructions (Signed)

## 2022-11-05 NOTE — Progress Notes (Unsigned)
Subjective:  Patient ID: Jason Phillips, male    DOB: 1981/08/02  Age: 41 y.o. MRN: [id number]  CC: Hyperlipidemia, Depression, and Anemia   HPI Jason Phillips presents for f/up -  His wife complains about his snoring and he complains of a tickling sensation in his fingers while sleeping.  He denies numbness, weakness, or tingling. He feels like he has RLS.  He is taking a triptan for the occasional migraine headache and says it is no longer effective.  He tells me the headaches have not changed but are only a little more frequent and occur about once a week.  The headaches can be debilitating and can take him out of commission for an entire day.  Outpatient Medications Prior to Visit  Medication Sig Dispense Refill   blood glucose meter kit and supplies Dispense based on patient and insurance preference. Use up to four times daily as directed. (FOR ICD-10 E10.9, E11.9). 1 each 0   fenofibrate 160 MG tablet Take 1 tablet (160 mg total) by mouth daily. 90 tablet 0   glucose blood (FREESTYLE LITE) test strip USE TO TEST AS DIRECTED UP TO 4 TIMES DAILY 100 strip 0   Pitavastatin Calcium (LIVALO) 2 MG TABS Take 1 tablet (2 mg total) by mouth daily. 90 tablet 1   tiZANidine (ZANAFLEX) 2 MG tablet Take 1 tablet (2 mg total) by mouth at bedtime. 30 tablet 0   diphenhydrAMINE (BENADRYL) 25 MG tablet Take 25 mg by mouth at bedtime.     FLUoxetine (PROZAC) 20 MG capsule Take 2 capsules (40 mg total) by mouth daily. 180 capsule 0   ibuprofen (ADVIL) 800 MG tablet Take 800 mg by mouth every 8 (eight) hours as needed for headache.     omega-3 acid ethyl esters (LOVAZA) 1 g capsule Take 2 capsules (2 g total) by mouth 2 (two) times daily. (Patient taking differently: Take 2 capsules by mouth at bedtime.) 120 capsule 6   SUMAtriptan (IMITREX) 100 MG tablet Take 0.5-1 tablets (50-100 mg total) by mouth every 2 (two) hours as needed for migraine 9 tablet 1   zolpidem (AMBIEN) 5 MG tablet Take 5 mg  by mouth at bedtime as needed for sleep.     No facility-administered medications prior to visit.    ROS Review of Systems  Constitutional:  Negative for appetite change, diaphoresis, fatigue and unexpected weight change.  HENT: Negative.  Negative for trouble swallowing.   Eyes: Negative.  Negative for visual disturbance.  Respiratory:  Negative for cough, chest tightness, shortness of breath and wheezing.   Cardiovascular:  Negative for chest pain, palpitations and leg swelling.  Gastrointestinal:  Negative for abdominal pain, diarrhea, nausea and vomiting.  Endocrine: Negative.   Genitourinary: Negative.  Negative for difficulty urinating.  Musculoskeletal: Negative.   Skin: Negative.   Neurological: Negative.  Negative for dizziness and weakness.  Hematological:  Negative for adenopathy. Does not bruise/bleed easily.  Psychiatric/Behavioral:  Positive for dysphoric mood and sleep disturbance. Negative for behavioral problems, confusion and suicidal ideas. The patient is not nervous/anxious.     Objective:  BP 132/86 (BP Location: Left Arm, Patient Position: Sitting, Cuff Size: Large)   Pulse 73   Temp 98.3 F (36.8 C) (Oral)   Resp 16   Ht _0  (1.727 m)   Wt 208 lb (94.3 kg)   SpO2 96%   BMI 31.63 kg/m   BP Readings from Last 3 Encounters:  11/05/22 132/86  10/15/22 118/82  09/11/22  126/86    Wt Readings from Last 3 Encounters:  11/05/22 208 lb (94.3 kg)  10/15/22 210 lb (95.3 kg)  09/11/22 210 lb (95.3 kg)    Physical Exam Vitals reviewed.  Constitutional:      Appearance: He is not ill-appearing.  HENT:     Mouth/Throat:     Mouth: Mucous membranes are moist.  Eyes:     General: No scleral icterus.    Conjunctiva/sclera: Conjunctivae normal.  Cardiovascular:     Rate and Rhythm: Normal rate and regular rhythm.     Heart sounds: No murmur heard.    No gallop.  Pulmonary:     Effort: Pulmonary effort is normal.     Breath sounds: No stridor. No  wheezing, rhonchi or rales.  Abdominal:     General: Abdomen is flat.     Palpations: There is no mass.     Tenderness: There is no abdominal tenderness. There is no guarding.     Hernia: No hernia is present.  Musculoskeletal:        General: Normal range of motion.     Cervical back: Neck supple.     Right lower leg: No edema.     Left lower leg: No edema.  Lymphadenopathy:     Cervical: No cervical adenopathy.  Skin:    General: Skin is warm and dry.  Neurological:     General: No focal deficit present.     Mental Status: He is alert. Mental status is at baseline.  Psychiatric:        Mood and Affect: Mood normal.        Behavior: Behavior normal.     Lab Results  Component Value Date   WBC 6.6 11/05/2022   HGB 13.5 11/05/2022   HCT 39.7 11/05/2022   PLT 325.0 11/05/2022   GLUCOSE 117 (H) 04/12/2022   CHOL 228 (H) 11/05/2022   TRIG 328.0 (H) 11/05/2022   HDL 46.70 11/05/2022   LDLDIRECT 131.0 11/05/2022   LDLCALC 124 (H) 02/19/2020   ALT 43 03/04/2022   AST 28 03/04/2022   NA 142 04/12/2022   K 4.3 04/12/2022   CL 106 04/12/2022   CREATININE 0.86 04/12/2022   BUN 18 04/12/2022   CO2 26 04/12/2022   TSH 2.33 05/15/2016   INR 1.0 03/28/2008   HGBA1C 6.7 (H) 11/05/2022   MICROALBUR 0.8 04/12/2022    ECHOCARDIOGRAM COMPLETE  Result Date: 03/05/2022    ECHOCARDIOGRAM REPORT   Patient Name:   Jason Phillips Date of Exam: 03/05/2022 Medical Rec #:  [id number]           Height:       67.0 in Accession #:    [id number]          Weight:       209.4 lb Date of Birth:  1981-08-16           BSA:          2.062 m Patient Age:    23 years            BP:           125/82 mmHg Patient Gender: M                   HR:           67 bpm. Exam Location:  Inpatient Procedure: 2D Echo, Cardiac Doppler and Color Doppler Indications:    R01.1 MURMUR  History:  Patient has no prior history of Echocardiogram examinations.                 Risk Factors:Dyslipidemia. GERD.   Sonographer:    Beryle Beams Referring Phys: [id number] [place]  1. Left ventricular ejection fraction, by estimation, is 60 to 65%. The left ventricle has normal function. The left ventricle has no regional wall motion abnormalities. Left ventricular diastolic parameters were normal.  2. Right ventricular systolic function is normal. The right ventricular size is normal.  3. Left atrial size was moderately dilated.  4. The mitral valve is abnormal. Trivial mitral valve regurgitation. No evidence of mitral stenosis.  5. Mean gradient across AV 6 mmHg AVA > 2cm2 . The aortic valve is tricuspid. There is mild calcification of the aortic valve. There is mild thickening of the aortic valve. Aortic valve regurgitation is not visualized. Aortic valve sclerosis/calcification is present, without any evidence of aortic stenosis.  6. The inferior vena cava is normal in size with greater than 50% respiratory variability, suggesting right atrial pressure of 3 mmHg. FINDINGS  Left Ventricle: Left ventricular ejection fraction, by estimation, is 60 to 65%. The left ventricle has normal function. The left ventricle has no regional wall motion abnormalities. The left ventricular internal cavity size was normal in size. There is  no left ventricular hypertrophy. Left ventricular diastolic parameters were normal. Right Ventricle: The right ventricular size is normal. No increase in right ventricular wall thickness. Right ventricular systolic function is normal. Left Atrium: Left atrial size was moderately dilated. Right Atrium: Right atrial size was normal in size. Pericardium: There is no evidence of pericardial effusion. Mitral Valve: The mitral valve is abnormal. There is mild thickening of the mitral valve leaflet(s). There is mild calcification of the mitral valve leaflet(s). Trivial mitral valve regurgitation. No evidence of mitral valve stenosis. Tricuspid Valve: The tricuspid valve is normal in structure.  Tricuspid valve regurgitation is not demonstrated. No evidence of tricuspid stenosis. Aortic Valve: Mean gradient across AV 6 mmHg AVA > 2cm2. The aortic valve is tricuspid. There is mild calcification of the aortic valve. There is mild thickening of the aortic valve. Aortic valve regurgitation is not visualized. Aortic valve sclerosis/calcification is present, without any evidence of aortic stenosis. Aortic valve mean gradient measures 6.0 mmHg. Aortic valve peak gradient measures 10.8 mmHg. Aortic valve area, by VTI measures 1.98 cm. Pulmonic Valve: The pulmonic valve was normal in structure. Pulmonic valve regurgitation is not visualized. No evidence of pulmonic stenosis. Aorta: The aortic root is normal in size and structure. Venous: The inferior vena cava is normal in size with greater than 50% respiratory variability, suggesting right atrial pressure of 3 mmHg. IAS/Shunts: No atrial level shunt detected by color flow Doppler.  LEFT VENTRICLE PLAX 2D LVIDd:         4.60 cm      Diastology LVIDs:         2.70 cm      LV e' medial:    9.57 cm/s LV PW:         1.20 cm      LV E/e' medial:  11.7 LV IVS:        1.10 cm      LV e' lateral:   15.10 cm/s LVOT diam:     1.70 cm      LV E/e' lateral: 7.4 LV SV:         74 LV SV Index:   36 LVOT Area:  2.27 cm  LV Volumes (MOD) LV vol d, MOD A2C: 77.2 ml LV vol d, MOD A4C: 103.0 ml LV vol s, MOD A2C: 27.2 ml LV vol s, MOD A4C: 38.6 ml LV SV MOD A2C:     50.0 ml LV SV MOD A4C:     103.0 ml LV SV MOD BP:      58.7 ml RIGHT VENTRICLE             IVC RV S prime:     13.90 cm/s  IVC diam: 2.00 cm TAPSE (M-mode): 2.6 cm LEFT ATRIUM             Index        RIGHT ATRIUM           Index LA diam:        5.00 cm 2.42 cm/m   RA Area:     11.90 cm LA Vol (A2C):   72.8 ml 35.30 ml/m  RA Volume:   25.50 ml  12.37 ml/m LA Vol (A4C):   96.2 ml 46.65 ml/m LA Biplane Vol: 87.6 ml 42.48 ml/m  AORTIC VALVE                     PULMONIC VALVE AV Area (Vmax):    2.30 cm      PV  Vmax:       0.75 m/s AV Area (Vmean):   2.04 cm      PV Vmean:      43.000 cm/s AV Area (VTI):     1.98 cm      PV VTI:        0.158 m AV Vmax:           164.00 cm/s   PV Peak grad:  2.2 mmHg AV Vmean:          117.000 cm/s  PV Mean grad:  1.0 mmHg AV VTI:            0.372 m AV Peak Grad:      10.8 mmHg AV Mean Grad:      6.0 mmHg LVOT Vmax:         166.00 cm/s LVOT Vmean:        105.000 cm/s LVOT VTI:          0.325 m LVOT/AV VTI ratio: 0.87  AORTA Ao Root diam: 3.00 cm Ao Asc diam:  3.10 cm MITRAL VALVE MV Area (PHT): 2.76 cm     SHUNTS MV Decel Time: 275 msec     Systemic VTI:  0.32 m MR Peak grad: 83.5 mmHg     Systemic Diam: 1.70 cm MR Mean grad: 54.0 mmHg MR Vmax:      457.00 cm/s MR Vmean:     348.0 cm/s MV E velocity: 112.00 cm/s MV A velocity: 63.00 cm/s MV E/A ratio:  1.78 Jenkins Rouge MD Electronically signed by Jenkins Rouge MD Signature Date/Time: 03/05/2022/9:57:48 AM    Final    DG Chest Portable 1 View  Result Date: 03/04/2022 CLINICAL DATA:  Sepsis workup.  Left knee drained 4 days ago. EXAM: PORTABLE CHEST 1 VIEW COMPARISON:  December 11, 2017 FINDINGS: The heart size and mediastinal contours are within normal limits. Both lungs are clear. The visualized skeletal structures are unremarkable. IMPRESSION: No active disease. Electronically Signed   By: Dorise Bullion III M.D.   On: 03/04/2022 16:13   DG Knee Complete 4 Views Left  Result Date: 03/04/2022 CLINICAL DATA:  LEFT knee  pain and fever. LEFT knee drained 4 days ago. Initial encounter. EXAM: LEFT KNEE - COMPLETE 4+ VIEW COMPARISON:  02/28/2022 radiograph FINDINGS: There is no evidence of acute fracture, subluxation or dislocation. No radiographic evidence of acute osteomyelitis noted. Prepatellar soft tissue swelling is again noted. An equivocal small effusion in the suprapatellar bursa may be present. IMPRESSION: 1. Prepatellar soft tissue swelling and equivocal small knee effusion. 2. No evidence of acute bony abnormality or  radiographic evidence of acute osteomyelitis. 3. No other significant change. Electronically Signed   By: Margarette Canada M.D.   On: 03/04/2022 14:15    Assessment & Plan:   Jason Phillips was seen today for hyperlipidemia, depression and anemia.  Diagnoses and all orders for this visit:  Primary hypertension- His blood pressure is adequately well controlled.  Pre-diabetes -     Hemoglobin A1c; Future -     Hemoglobin A1c  Hypertriglyceridemia -     omega-3 acid ethyl esters (LOVAZA) 1 g capsule; Take 2 capsules (2 g total) by mouth 2 (two) times daily. -     Lipid panel; Future -     Lipid panel  Gastroesophageal reflux disease without esophagitis  Episodic mood disorder (HCC) -     FLUoxetine (PROZAC) 20 MG capsule; Take 2 capsules (40 mg total) by mouth daily.  Deficiency anemia- His H&H are normal now. -     IBC + Ferritin; Future -     Vitamin B12; Future -     CBC with Differential/Platelet; Future -     Zinc; Future -     Vitamin B1; Future -     Folate; Future -     Folate -     Vitamin B1 -     Zinc -     CBC with Differential/Platelet -     Vitamin B12 -     IBC + Ferritin  Loud snoring -     Ambulatory referral to Sleep Studies  Psychophysiological insomnia -     zolpidem (AMBIEN) 5 MG tablet; Take 1 tablet (5 mg total) by mouth at bedtime as needed for sleep.  Migraine with aura and without status migrainosus, not intractable -     Ubrogepant (UBRELVY) 100 MG TABS; Take 1 tablet by mouth as needed.  Type II diabetes mellitus with manifestations ([place])- Will treat with a GLP/GIP agonist. -     tirzepatide (MOUNJARO) 2.5 MG/0.5ML Pen; Inject 2.5 mg into the skin once a week.  Other orders -     LDL cholesterol, direct   I have discontinued Maurio B. Brackeen "Tim"'s SUMAtriptan, diphenhydrAMINE, and ibuprofen. I have also changed his zolpidem. Additionally, I am having him start on Ubrelvy and tirzepatide. Lastly, I am having him maintain his blood glucose meter  kit and supplies, FREESTYLE LITE, Livalo, tiZANidine, fenofibrate, FLUoxetine, and omega-3 acid ethyl esters.  Meds ordered this encounter  Medications   FLUoxetine (PROZAC) 20 MG capsule    Sig: Take 2 capsules (40 mg total) by mouth daily.    Dispense:  180 capsule    Refill:  1   omega-3 acid ethyl esters (LOVAZA) 1 g capsule    Sig: Take 2 capsules (2 g total) by mouth 2 (two) times daily.    Dispense:  360 capsule    Refill:  1   zolpidem (AMBIEN) 5 MG tablet    Sig: Take 1 tablet (5 mg total) by mouth at bedtime as needed for sleep.    Dispense:  90 tablet  Refill:  1   Ubrogepant (UBRELVY) 100 MG TABS    Sig: Take 1 tablet by mouth as needed.    Dispense:  8 tablet    Refill:  1   tirzepatide (MOUNJARO) 2.5 MG/0.5ML Pen    Sig: Inject 2.5 mg into the skin once a week.    Dispense:  2 mL    Refill:  0     Follow-up: Return in about 6 months (around 05/06/2023).  Scarlette Calico, MD

## 2022-11-06 ENCOUNTER — Other Ambulatory Visit (HOSPITAL_COMMUNITY): Payer: Self-pay

## 2022-11-06 MED ORDER — TIRZEPATIDE 2.5 MG/0.5ML ~~LOC~~ SOAJ
2.5000 mg | SUBCUTANEOUS | 0 refills | Status: DC
  Filled 2022-11-06 – 2022-12-11 (×2): qty 2, 28d supply, fill #0

## 2022-11-07 ENCOUNTER — Telehealth: Payer: Self-pay

## 2022-11-07 LAB — VITAMIN B1: Vitamin B1 (Thiamine): 21 nmol/L (ref 8–30)

## 2022-11-07 LAB — ZINC: Zinc: 86 ug/dL (ref 60–130)

## 2022-11-07 NOTE — Telephone Encounter (Signed)
Key: [employer]

## 2022-11-09 ENCOUNTER — Encounter: Payer: Self-pay | Admitting: Internal Medicine

## 2022-11-14 ENCOUNTER — Other Ambulatory Visit (HOSPITAL_COMMUNITY): Payer: Self-pay

## 2022-11-28 NOTE — Progress Notes (Signed)
[name] [address] [place] [address] Phone: [phone] Subjective:   Jason Phillips, am serving as a scribe for Dr. Antoine Primas.  I'm seeing this patient by the request  of:  Etta Grandchild, MD  CC: Back and neck pain follow-up  [id number]  Jason Phillips is a 41 y.o. male coming in with complaint of back and neck pain. OMT 10/15/2022.  Patient states that he has been doing well. No issues with his back. He has been doing a lot of manual labor.   Also c/o pain in bilateral hands. Achiness at night.   Medications patient has been prescribed: Zanaflex  Taking:         Reviewed prior external information including notes and imaging from previsou exam, outside providers and external EMR if available.   As well as notes that were available from care everywhere and other healthcare systems.  Past medical history, social, surgical and family history all reviewed in electronic medical record.  No pertanent information unless stated regarding to the chief complaint.   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 POSITIVE muscle aches  Objective  Blood pressure 124/88, pulse 64, height 5\' 8"  (1.727 m), weight 208 lb (94.3 kg), SpO2 97 %.   General: No apparent distress alert and oriented x3 mood and affect normal, dressed  appropriately.  HEENT: Pupils equal, extraocular movements intact  Respiratory: Patient's speak in full sentences and does not appear short of breath  Cardiovascular: No lower extremity edema, non tender, no erythema  Back exam does have some loss of lordosis.  Some tenderness to palpation in the paraspinal musculature.  More tightness around the neck actually than anything else today though.  Seems to have some decrease in sidebending bilaterally  Osteopathic findings  C3 flexed rotated and side bent right C7 flexed rotated and side bent left T3 extended rotated and side bent right inhaled rib T6 extended rotated and side bent left L2 flexed rotated and side bent right Sacrum right on right       Assessment and Plan:  Somatic dysfunction of spine, lumbar Seemed to respond very well to the core strengthening at this moment.  Patient is also responding extremely well to the osteopathic manipulation.  Discussed icing regimen and home exercises, which activities to do and which was to avoid.  Increase activity slowly.  Follow-up again in 6 to 8 weeks otherwise.    Nonallopathic problems  Decision today to treat with OMT was based on Physical Exam  After verbal consent patient was treated with HVLA, ME, FPR techniques in cervical, rib, thoracic, lumbar, and sacral  areas  Patient tolerated the procedure well with improvement in symptoms  Patient given exercises, stretches and lifestyle modifications  See medications in patient instructions if  given  Patient will follow up in 4-8 weeks    The above documentation has been reviewed and is accurate and complete [name], DO          Note: This dictation was prepared with Dragon dictation along with smaller phrase technology. Any transcriptional errors that result from this process are unintentional.

## 2022-12-03 ENCOUNTER — Other Ambulatory Visit: Payer: Self-pay | Admitting: Internal Medicine

## 2022-12-03 ENCOUNTER — Ambulatory Visit: Payer: 59 | Admitting: Family Medicine

## 2022-12-03 ENCOUNTER — Other Ambulatory Visit (HOSPITAL_COMMUNITY): Payer: Self-pay

## 2022-12-03 VITALS — BP 124/88 | HR 64 | Ht 68.0 in | Wt 208.0 lb

## 2022-12-03 DIAGNOSIS — M9902 Segmental and somatic dysfunction of thoracic region: Secondary | ICD-10-CM

## 2022-12-03 DIAGNOSIS — E785 Hyperlipidemia, unspecified: Secondary | ICD-10-CM

## 2022-12-03 DIAGNOSIS — M9908 Segmental and somatic dysfunction of rib cage: Secondary | ICD-10-CM

## 2022-12-03 DIAGNOSIS — M9903 Segmental and somatic dysfunction of lumbar region: Secondary | ICD-10-CM

## 2022-12-03 DIAGNOSIS — M9901 Segmental and somatic dysfunction of cervical region: Secondary | ICD-10-CM | POA: Diagnosis not present

## 2022-12-03 DIAGNOSIS — M9904 Segmental and somatic dysfunction of sacral region: Secondary | ICD-10-CM

## 2022-12-03 MED ORDER — PITAVASTATIN CALCIUM 2 MG PO TABS
1.0000 | ORAL_TABLET | Freq: Every day | ORAL | 1 refills | Status: DC
  Filled 2022-12-03: qty 90, 90d supply, fill #0
  Filled 2023-03-04: qty 90, 90d supply, fill #1

## 2022-12-03 NOTE — Assessment & Plan Note (Signed)
Seemed to respond very well to the core strengthening at this moment.  Patient is also responding extremely well to the osteopathic manipulation.  Discussed icing regimen and home exercises, which activities to do and which was to avoid.  Increase activity slowly.  Follow-up again in 6 to 8 weeks otherwise.

## 2022-12-03 NOTE — Patient Instructions (Signed)
Good luck with the dog Tape with coban See me again in 6-8 weeks

## 2022-12-05 ENCOUNTER — Ambulatory Visit: Payer: 59 | Admitting: Internal Medicine

## 2022-12-11 ENCOUNTER — Other Ambulatory Visit (HOSPITAL_COMMUNITY): Payer: Self-pay

## 2022-12-12 DIAGNOSIS — H2513 Age-related nuclear cataract, bilateral: Secondary | ICD-10-CM | POA: Diagnosis not present

## 2022-12-12 DIAGNOSIS — H35413 Lattice degeneration of retina, bilateral: Secondary | ICD-10-CM | POA: Diagnosis not present

## 2022-12-12 DIAGNOSIS — E119 Type 2 diabetes mellitus without complications: Secondary | ICD-10-CM | POA: Diagnosis not present

## 2022-12-12 LAB — HM DIABETES EYE EXAM

## 2022-12-27 ENCOUNTER — Other Ambulatory Visit (HOSPITAL_COMMUNITY): Payer: Self-pay

## 2023-01-01 ENCOUNTER — Other Ambulatory Visit: Payer: Self-pay | Admitting: Internal Medicine

## 2023-01-01 ENCOUNTER — Other Ambulatory Visit: Payer: Self-pay

## 2023-01-01 ENCOUNTER — Other Ambulatory Visit (HOSPITAL_COMMUNITY): Payer: Self-pay

## 2023-01-01 DIAGNOSIS — E781 Pure hyperglyceridemia: Secondary | ICD-10-CM

## 2023-01-01 MED ORDER — FENOFIBRATE 160 MG PO TABS
160.0000 mg | ORAL_TABLET | Freq: Every day | ORAL | 0 refills | Status: DC
  Filled 2023-01-01 – 2023-01-08 (×2): qty 90, 90d supply, fill #0

## 2023-01-03 ENCOUNTER — Other Ambulatory Visit (HOSPITAL_COMMUNITY): Payer: Self-pay

## 2023-01-03 ENCOUNTER — Encounter (HOSPITAL_COMMUNITY): Payer: Self-pay

## 2023-01-04 ENCOUNTER — Other Ambulatory Visit (HOSPITAL_COMMUNITY): Payer: Self-pay

## 2023-01-07 ENCOUNTER — Other Ambulatory Visit: Payer: Self-pay

## 2023-01-08 ENCOUNTER — Other Ambulatory Visit (HOSPITAL_COMMUNITY): Payer: Self-pay

## 2023-01-10 NOTE — Progress Notes (Signed)
Jason Phillips Phone: [phone] Subjective:   Fontaine No, am serving as a scribe for Dr. Hulan Saas.  I'm seeing this patient by the request  of:  Jason Lima, MD  CC: Back and neck pain follow-up  [id number]  Jason Phillips is a 42 y.o. male coming in with complaint of back and neck pain. OMT 12/03/2022. Patient states that he is doing well.  Has had some stiffness off and on.  Has not needed his Zanaflex in the a while.  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  Medications patient has been prescribed: Zanaflex  Taking: Intermittently         Reviewed prior external information including notes and imaging from previsou exam, outside providers and external EMR if available.   As well as notes that were available from care everywhere and other healthcare systems.  Past medical history, social, surgical and family history all reviewed in electronic medical record.  No pertanent information unless stated regarding to the chief complaint.   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 POSITIVE muscle aches  Objective  Blood pressure 122/82, pulse 64, height 5\' 8"  (1.727 m), weight 199 lb 12.8 oz (90.6 kg), SpO2 98 %.   General: No apparent  distress alert and oriented x3 mood and affect normal, dressed appropriately.  HEENT: Pupils equal, extraocular movements intact  Respiratory: Patient's speak in full sentences and does not appear short of breath  Cardiovascular: No lower extremity edema, non tender, no erythema  Low back exam does have some loss of lordosis.  Some tightness noted in the paraspinal musculature.  Patient still has more tenderness noted in the paraspinal musculature lumbar spine right greater than left.  Osteopathic findings  C2 flexed rotated and side bent right C7 flexed rotated and side bent left T3 extended rotated and side bent right inhaled rib T8 extended rotated and side bent left L1 flexed rotated and side bent right Sacrum right on right       Assessment and Plan:  Low back pain Chronic problem but does have exacerbations from time to time.  He is having some increase in tightness with patient being more cold outside as well as trying to work out more.  We discussed continuing to monitor her core strength.  Will discuss with patient more other ergonomics that could be helpful.  Discussed proper shoes as well.  Will follow-up again in 6 to 8 weeks Zanaflex discussed as well    Nonallopathic problems  Decision today to treat with OMT was based on Physical Exam  After verbal consent patient was treated with HVLA, ME, FPR techniques in cervical, rib, thoracic, lumbar, and sacral  areas  Patient tolerated  the procedure well with improvement in symptoms  Patient given exercises, stretches and lifestyle modifications  See medications in patient instructions if given  Patient will follow up in 4-8 weeks     The above documentation has been reviewed and is accurate and complete [name], DO         Note: This dictation was prepared with Dragon dictation along with smaller phrase technology. Any transcriptional errors that result from this process are unintentional.

## 2023-01-14 ENCOUNTER — Ambulatory Visit: Payer: Commercial Managed Care - PPO | Admitting: Family Medicine

## 2023-01-14 VITALS — BP 122/82 | HR 64 | Ht 68.0 in | Wt 199.8 lb

## 2023-01-14 DIAGNOSIS — M9902 Segmental and somatic dysfunction of thoracic region: Secondary | ICD-10-CM

## 2023-01-14 DIAGNOSIS — M545 Low back pain, unspecified: Secondary | ICD-10-CM | POA: Diagnosis not present

## 2023-01-14 DIAGNOSIS — G8929 Other chronic pain: Secondary | ICD-10-CM

## 2023-01-14 DIAGNOSIS — M9908 Segmental and somatic dysfunction of rib cage: Secondary | ICD-10-CM | POA: Diagnosis not present

## 2023-01-14 DIAGNOSIS — M9904 Segmental and somatic dysfunction of sacral region: Secondary | ICD-10-CM

## 2023-01-14 DIAGNOSIS — M9901 Segmental and somatic dysfunction of cervical region: Secondary | ICD-10-CM

## 2023-01-14 DIAGNOSIS — M9903 Segmental and somatic dysfunction of lumbar region: Secondary | ICD-10-CM

## 2023-01-14 NOTE — Patient Instructions (Signed)
Stay Active See you again in 2-3 months

## 2023-01-14 NOTE — Assessment & Plan Note (Signed)
Chronic problem but does have exacerbations from time to time.  He is having some increase in tightness with patient being more cold outside as well as trying to work out more.  We discussed continuing to monitor her core strength.  Will discuss with patient more other ergonomics that could be helpful.  Discussed proper shoes as well.  Will follow-up again in 6 to 8 weeks Zanaflex discussed as well

## 2023-02-04 ENCOUNTER — Other Ambulatory Visit (HOSPITAL_COMMUNITY): Payer: Self-pay

## 2023-02-13 ENCOUNTER — Other Ambulatory Visit (HOSPITAL_COMMUNITY): Payer: Self-pay

## 2023-02-13 ENCOUNTER — Other Ambulatory Visit: Payer: Self-pay | Admitting: Family Medicine

## 2023-02-13 MED ORDER — TIZANIDINE HCL 2 MG PO TABS
2.0000 mg | ORAL_TABLET | Freq: Every day | ORAL | 0 refills | Status: DC
  Filled 2023-02-13: qty 30, 30d supply, fill #0

## 2023-02-28 ENCOUNTER — Other Ambulatory Visit (HOSPITAL_COMMUNITY): Payer: Self-pay

## 2023-03-04 ENCOUNTER — Other Ambulatory Visit (HOSPITAL_COMMUNITY): Payer: Self-pay

## 2023-03-05 ENCOUNTER — Other Ambulatory Visit (HOSPITAL_COMMUNITY): Payer: Self-pay

## 2023-03-06 NOTE — Progress Notes (Signed)
Jason Phillips [facility] [address] [place] Phone: [phone] Subjective:   Jason Phillips, am serving as a scribe for Dr. Hulan Saas.  I'm seeing this patient by the request  of:  Jason Lima, MD  CC: Back and neck pain follow-up  [id number]  Jason Phillips is a 42 y.o. male coming in with complaint of back and neck pain. OMT 01/14/2023. Patient states doing well. Same per usual. No new concerns.  Medications patient has been prescribed: Zanaflex  Taking:         Reviewed prior external information including notes and imaging from previsou exam, outside providers and external EMR if available.   As well as notes that were available from care everywhere and other healthcare systems.  Past medical history, social, surgical and family history all reviewed in electronic medical record.  No pertanent information unless stated regarding to the chief complaint.   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 POSITIVE muscle aches  Objective  Blood pressure 128/86, pulse 69, height 5\' 8"  (1.727 m), weight 206 lb (93.4 kg), SpO2 98 %.   General: No apparent distress alert and oriented x3 mood and affect normal, dressed appropriately.  HEENT: Pupils equal, extraocular movements intact  Respiratory: Patient's speak in full  sentences and does not appear short of breath  Cardiovascular: No lower extremity edema, non tender, no erythema  Back exam does have some loss of lordosis.  Patient does have some tightness around the right sacroiliac joint.  Positive Corky Sox noted.  Patient's neck exam also has some limited sidebending bilaterally.  Osteopathic findings  C3 flexed rotated and side bent right T3 extended rotated and side bent right inhaled rib T9 extended rotated and side bent left L1 flexed rotated and side bent right Sacrum right on right       Assessment and Plan:  Low back pain Patient still has some muscle imbalances noted.  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  He as good as stretching as he probably should be.  Discussed icing regimen exercises.  Continue to monitor core strengthening.  Increase activity slowly.  Follow-up again in 6 to 8 weeks    Nonallopathic problems  Decision today to treat with OMT was based on Physical Exam  After verbal consent patient was treated with HVLA, ME, FPR techniques in cervical, rib, thoracic, lumbar, and sacral  areas  Patient tolerated the procedure well with improvement in symptoms  Patient given exercises, stretches and lifestyle modifications  See medications in patient instructions if given  Patient will follow up in 4-8 weeks     The above documentation has been reviewed and is accurate and complete [name], DO  Note: This dictation was prepared with Dragon dictation along with smaller phrase technology. Any transcriptional errors that result from this process are unintentional.

## 2023-03-13 ENCOUNTER — Telehealth: Payer: Self-pay

## 2023-03-13 NOTE — Telephone Encounter (Signed)
Pharmacy Patient Advocate Encounter  Prior Authorization for Jason Phillips has been approved by [employer] (ins).    PA # [id number] Effective dates: 03/08/2023 through 09/08/2023

## 2023-03-17 ENCOUNTER — Other Ambulatory Visit (HOSPITAL_COMMUNITY): Payer: Self-pay

## 2023-03-19 ENCOUNTER — Encounter: Payer: Self-pay | Admitting: Family Medicine

## 2023-03-19 ENCOUNTER — Ambulatory Visit: Payer: Commercial Managed Care - PPO | Admitting: Family Medicine

## 2023-03-19 VITALS — BP 128/86 | HR 69 | Ht 68.0 in | Wt 206.0 lb

## 2023-03-19 DIAGNOSIS — M9901 Segmental and somatic dysfunction of cervical region: Secondary | ICD-10-CM

## 2023-03-19 DIAGNOSIS — M9902 Segmental and somatic dysfunction of thoracic region: Secondary | ICD-10-CM | POA: Diagnosis not present

## 2023-03-19 DIAGNOSIS — M9904 Segmental and somatic dysfunction of sacral region: Secondary | ICD-10-CM | POA: Diagnosis not present

## 2023-03-19 DIAGNOSIS — G8929 Other chronic pain: Secondary | ICD-10-CM | POA: Diagnosis not present

## 2023-03-19 DIAGNOSIS — M545 Low back pain, unspecified: Secondary | ICD-10-CM | POA: Diagnosis not present

## 2023-03-19 DIAGNOSIS — M9908 Segmental and somatic dysfunction of rib cage: Secondary | ICD-10-CM

## 2023-03-19 DIAGNOSIS — M9903 Segmental and somatic dysfunction of lumbar region: Secondary | ICD-10-CM

## 2023-03-19 NOTE — Patient Instructions (Signed)
Try to do exercises for elbow Ice after See you again in 2-3 months

## 2023-03-19 NOTE — Assessment & Plan Note (Signed)
Patient still has some muscle imbalances noted.  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  He as good as stretching as he probably should be.  Discussed icing regimen exercises.  Continue to monitor core strengthening.  Increase activity slowly.  Follow-up again in 6 to 8 weeks

## 2023-04-10 ENCOUNTER — Other Ambulatory Visit: Payer: Self-pay | Admitting: Internal Medicine

## 2023-04-10 DIAGNOSIS — E781 Pure hyperglyceridemia: Secondary | ICD-10-CM

## 2023-04-11 ENCOUNTER — Other Ambulatory Visit: Payer: Self-pay | Admitting: Internal Medicine

## 2023-04-11 ENCOUNTER — Other Ambulatory Visit (HOSPITAL_COMMUNITY): Payer: Self-pay

## 2023-04-11 ENCOUNTER — Encounter: Payer: Self-pay | Admitting: Internal Medicine

## 2023-04-11 DIAGNOSIS — E785 Hyperlipidemia, unspecified: Secondary | ICD-10-CM

## 2023-04-11 DIAGNOSIS — E781 Pure hyperglyceridemia: Secondary | ICD-10-CM

## 2023-04-11 DIAGNOSIS — F39 Unspecified mood [affective] disorder: Secondary | ICD-10-CM

## 2023-04-11 DIAGNOSIS — E118 Type 2 diabetes mellitus with unspecified complications: Secondary | ICD-10-CM

## 2023-04-11 DIAGNOSIS — K219 Gastro-esophageal reflux disease without esophagitis: Secondary | ICD-10-CM

## 2023-04-11 MED ORDER — FENOFIBRATE 160 MG PO TABS
160.0000 mg | ORAL_TABLET | Freq: Every day | ORAL | 0 refills | Status: DC
  Filled 2023-04-11: qty 30, 30d supply, fill #0

## 2023-04-11 MED ORDER — OMEGA-3-ACID ETHYL ESTERS 1 G PO CAPS
2.0000 | ORAL_CAPSULE | Freq: Two times a day (BID) | ORAL | 0 refills | Status: DC
Start: 2023-04-11 — End: 2023-05-29
  Filled 2023-04-11: qty 120, 30d supply, fill #0

## 2023-04-11 MED ORDER — FLUOXETINE HCL 20 MG PO CAPS
40.0000 mg | ORAL_CAPSULE | Freq: Every day | ORAL | 0 refills | Status: DC
Start: 2023-04-11 — End: 2023-05-21
  Filled 2023-04-11 – 2023-05-06 (×2): qty 60, 30d supply, fill #0

## 2023-04-11 MED ORDER — PITAVASTATIN CALCIUM 2 MG PO TABS
1.0000 | ORAL_TABLET | Freq: Every day | ORAL | 0 refills | Status: DC
Start: 2023-04-11 — End: 2023-05-21
  Filled 2023-04-11: qty 30, 30d supply, fill #0

## 2023-05-03 ENCOUNTER — Other Ambulatory Visit (INDEPENDENT_AMBULATORY_CARE_PROVIDER_SITE_OTHER): Payer: Commercial Managed Care - PPO

## 2023-05-03 DIAGNOSIS — E781 Pure hyperglyceridemia: Secondary | ICD-10-CM | POA: Diagnosis not present

## 2023-05-03 DIAGNOSIS — E785 Hyperlipidemia, unspecified: Secondary | ICD-10-CM | POA: Diagnosis not present

## 2023-05-03 DIAGNOSIS — E118 Type 2 diabetes mellitus with unspecified complications: Secondary | ICD-10-CM

## 2023-05-03 LAB — HEPATIC FUNCTION PANEL
ALT: 34 U/L (ref 0–53)
AST: 30 U/L (ref 0–37)
Albumin: 4.3 g/dL (ref 3.5–5.2)
Alkaline Phosphatase: 37 U/L — ABNORMAL LOW (ref 39–117)
Bilirubin, Direct: 0.1 mg/dL (ref 0.0–0.3)
Total Bilirubin: 0.5 mg/dL (ref 0.2–1.2)
Total Protein: 7.3 g/dL (ref 6.0–8.3)

## 2023-05-03 LAB — URINALYSIS, ROUTINE W REFLEX MICROSCOPIC
Bilirubin Urine: NEGATIVE
Hgb urine dipstick: NEGATIVE
Ketones, ur: NEGATIVE
Leukocytes,Ua: NEGATIVE
Nitrite: NEGATIVE
Specific Gravity, Urine: 1.025 (ref 1.000–1.030)
Total Protein, Urine: NEGATIVE
Urine Glucose: NEGATIVE
Urobilinogen, UA: 0.2 (ref 0.0–1.0)
pH: 5.5 (ref 5.0–8.0)

## 2023-05-03 LAB — BASIC METABOLIC PANEL
BUN: 14 mg/dL (ref 6–23)
CO2: 26 mEq/L (ref 19–32)
Calcium: 9.8 mg/dL (ref 8.4–10.5)
Chloride: 109 mEq/L (ref 96–112)
Creatinine, Ser: 0.87 mg/dL (ref 0.40–1.50)
GFR: 105.49 mL/min (ref >60.00)
Glucose, Bld: 128 mg/dL — ABNORMAL HIGH (ref 70–99)
Potassium: 4 mEq/L (ref 3.5–5.1)
Sodium: 144 mEq/L (ref 135–145)

## 2023-05-03 LAB — MICROALBUMIN / CREATININE URINE RATIO
Creatinine,U: 82.3 mg/dL
Microalb Creat Ratio: 0.9 mg/g (ref 0.0–30.0)
Microalb, Ur: <0.7 mg/dL (ref 0.0–1.9)

## 2023-05-03 LAB — HEMOGLOBIN A1C: Hgb A1c MFr Bld: 6.6 % — ABNORMAL HIGH (ref 4.6–6.5)

## 2023-05-03 LAB — TRIGLYCERIDES: Triglycerides: 218 mg/dL — ABNORMAL HIGH (ref 0.0–149.0)

## 2023-05-06 ENCOUNTER — Other Ambulatory Visit: Payer: Self-pay | Admitting: Internal Medicine

## 2023-05-06 ENCOUNTER — Other Ambulatory Visit (HOSPITAL_COMMUNITY): Payer: Self-pay

## 2023-05-06 ENCOUNTER — Other Ambulatory Visit: Payer: Self-pay

## 2023-05-06 DIAGNOSIS — E781 Pure hyperglyceridemia: Secondary | ICD-10-CM

## 2023-05-07 ENCOUNTER — Encounter: Payer: Self-pay | Admitting: Internal Medicine

## 2023-05-07 ENCOUNTER — Other Ambulatory Visit (HOSPITAL_COMMUNITY): Payer: Self-pay

## 2023-05-17 ENCOUNTER — Other Ambulatory Visit (HOSPITAL_COMMUNITY): Payer: Self-pay

## 2023-05-21 ENCOUNTER — Other Ambulatory Visit (HOSPITAL_COMMUNITY): Payer: Self-pay

## 2023-05-21 ENCOUNTER — Ambulatory Visit: Payer: Commercial Managed Care - PPO | Admitting: Internal Medicine

## 2023-05-21 ENCOUNTER — Encounter: Payer: Self-pay | Admitting: Internal Medicine

## 2023-05-21 VITALS — BP 138/86 | HR 67 | Temp 98.4°F | Resp 16 | Ht 68.0 in | Wt 198.0 lb

## 2023-05-21 DIAGNOSIS — E1159 Type 2 diabetes mellitus with other circulatory complications: Secondary | ICD-10-CM | POA: Diagnosis not present

## 2023-05-21 DIAGNOSIS — Z0001 Encounter for general adult medical examination with abnormal findings: Secondary | ICD-10-CM

## 2023-05-21 DIAGNOSIS — W57XXXA Bitten or stung by nonvenomous insect and other nonvenomous arthropods, initial encounter: Secondary | ICD-10-CM | POA: Diagnosis not present

## 2023-05-21 DIAGNOSIS — E785 Hyperlipidemia, unspecified: Secondary | ICD-10-CM | POA: Diagnosis not present

## 2023-05-21 DIAGNOSIS — I152 Hypertension secondary to endocrine disorders: Secondary | ICD-10-CM | POA: Diagnosis not present

## 2023-05-21 DIAGNOSIS — F39 Unspecified mood [affective] disorder: Secondary | ICD-10-CM | POA: Diagnosis not present

## 2023-05-21 DIAGNOSIS — E118 Type 2 diabetes mellitus with unspecified complications: Secondary | ICD-10-CM | POA: Diagnosis not present

## 2023-05-21 DIAGNOSIS — S30860A Insect bite (nonvenomous) of lower back and pelvis, initial encounter: Secondary | ICD-10-CM

## 2023-05-21 DIAGNOSIS — I340 Nonrheumatic mitral (valve) insufficiency: Secondary | ICD-10-CM

## 2023-05-21 DIAGNOSIS — E781 Pure hyperglyceridemia: Secondary | ICD-10-CM | POA: Diagnosis not present

## 2023-05-21 LAB — CBC WITH DIFFERENTIAL/PLATELET
Basophils Absolute: 0.1 10*3/uL (ref 0.0–0.1)
Basophils Relative: 0.7 % (ref 0.0–3.0)
Eosinophils Absolute: 0.2 10*3/uL (ref 0.0–0.7)
Eosinophils Relative: 2.4 % (ref 0.0–5.0)
HCT: 37.9 % — ABNORMAL LOW (ref 39.0–52.0)
Hemoglobin: 12.9 g/dL — ABNORMAL LOW (ref 13.0–17.0)
Lymphocytes Relative: 40.2 % (ref 12.0–46.0)
Lymphs Abs: 3.4 10*3/uL (ref 0.7–4.0)
MCHC: 34 g/dL (ref 30.0–36.0)
MCV: 89.2 fl (ref 78.0–100.0)
Monocytes Absolute: 0.5 10*3/uL (ref 0.1–1.0)
Monocytes Relative: 6.3 % (ref 3.0–12.0)
Neutro Abs: 4.3 10*3/uL (ref 1.4–7.7)
Neutrophils Relative %: 50.4 % (ref 43.0–77.0)
Platelets: 303 10*3/uL (ref 150.0–400.0)
RBC: 4.25 Mil/uL (ref 4.22–5.81)
RDW: 13.4 % (ref 11.5–15.5)
WBC: 8.6 10*3/uL (ref 4.0–10.5)

## 2023-05-21 LAB — LIPID PANEL
Cholesterol: 233 mg/dL — ABNORMAL HIGH (ref 0–200)
HDL: 56.2 mg/dL (ref >39.00)
Total CHOL/HDL Ratio: 4
Triglycerides: 481 mg/dL — ABNORMAL HIGH (ref 0.0–149.0)

## 2023-05-21 LAB — CK: Total CK: 111 U/L (ref 7–232)

## 2023-05-21 LAB — TSH: TSH: 2.76 u[IU]/mL (ref 0.35–5.50)

## 2023-05-21 LAB — LDL CHOLESTEROL, DIRECT: Direct LDL: 112 mg/dL

## 2023-05-21 MED ORDER — FLUOXETINE HCL 20 MG PO CAPS
40.0000 mg | ORAL_CAPSULE | Freq: Every day | ORAL | 1 refills | Status: DC
Start: 2023-05-21 — End: 2023-11-26
  Filled 2023-05-21 – 2023-06-04 (×2): qty 180, 90d supply, fill #0
  Filled 2023-09-02: qty 180, 90d supply, fill #1

## 2023-05-21 MED ORDER — PITAVASTATIN CALCIUM 2 MG PO TABS
1.0000 | ORAL_TABLET | Freq: Every day | ORAL | 1 refills | Status: DC
Start: 2023-05-21 — End: 2023-11-24
  Filled 2023-05-21: qty 90, 90d supply, fill #0
  Filled 2023-09-02: qty 90, 90d supply, fill #1

## 2023-05-21 MED ORDER — FENOFIBRATE 160 MG PO TABS
160.0000 mg | ORAL_TABLET | Freq: Every day | ORAL | 1 refills | Status: DC
Start: 2023-05-21 — End: 2023-11-22
  Filled 2023-05-21: qty 90, 90d supply, fill #0
  Filled 2023-08-21: qty 90, 90d supply, fill #1

## 2023-05-21 MED ORDER — OLMESARTAN MEDOXOMIL 20 MG PO TABS
20.0000 mg | ORAL_TABLET | Freq: Every day | ORAL | 1 refills | Status: DC
Start: 2023-05-21 — End: 2024-06-25
  Filled 2023-05-21: qty 90, 90d supply, fill #0

## 2023-05-21 NOTE — Patient Instructions (Addendum)
Trivial mitral regurgitation   Health Maintenance, Male Adopting a healthy lifestyle and getting preventive care are important in promoting health and wellness. Ask your health care provider about: The right schedule for you to have regular tests and exams. Things you can do on your own to prevent diseases and keep yourself healthy. What should I know about diet, weight, and exercise? Eat a healthy diet  Eat a diet that includes plenty of vegetables, fruits, low-fat dairy products, and lean protein. Do not eat a lot of foods that are high in solid fats, added sugars, or sodium. Maintain a healthy weight Body mass index (BMI) is a measurement that can be used to identify possible weight problems. It estimates body fat based on height and weight. Your health care provider can help determine your BMI and help you achieve or maintain a healthy weight. Get regular exercise Get regular exercise. This is one of the most important things you can do for your health. Most adults should: Exercise for at least 150 minutes each week. The exercise should increase your heart rate and make you sweat (moderate-intensity exercise). Do strengthening exercises at least twice a week. This is in addition to the moderate-intensity exercise. Spend less time sitting. Even light physical activity can be beneficial. Watch cholesterol and blood lipids Have your blood tested for lipids and cholesterol at 44 years of age, then have this test every 5 years. You may need to have your cholesterol levels checked more often if: Your lipid or cholesterol levels are high. You are older than 44 years of age. You are at high risk for heart disease. What should I know about cancer screening? Many types of cancers can be detected early and may often be prevented. Depending on your health history and family history, you may need to have cancer screening at various ages. This may include screening for: Colorectal cancer. Prostate  cancer. Skin cancer. Lung cancer. What should I know about heart disease, diabetes, and high blood pressure? Blood pressure and heart disease High blood pressure causes heart disease and increases the risk of stroke. This is more likely to develop in people who have high blood pressure readings or are overweight. Talk with your health care provider about your target blood pressure readings. Have your blood pressure checked: Every 3-5 years if you are 43-64 years of age. Every year if you are 40 years old or older. If you are between the ages of 9 and 42 and are a current or former smoker, ask your health care provider if you should have a one-time screening for abdominal aortic aneurysm (AAA). Diabetes Have regular diabetes screenings. This checks your fasting blood sugar level. Have the screening done: Once every three years after age 42 if you are at a normal weight and have a low risk for diabetes. More often and at a younger age if you are overweight or have a high risk for diabetes. What should I know about preventing infection? Hepatitis B If you have a higher risk for hepatitis B, you should be screened for this virus. Talk with your health care provider to find out if you are at risk for hepatitis B infection. Hepatitis C Blood testing is recommended for: Everyone born from [birth year] through 1967. Anyone with known risk factors for hepatitis C. Sexually transmitted infections (STIs) You should be screened each year for STIs, including gonorrhea and chlamydia, if: You are sexually active and are younger than 44 years of age. You are older than 44  years of age and your health care provider tells you that you are at risk for this type of infection. Your sexual activity has changed since you were last screened, and you are at increased risk for chlamydia or gonorrhea. Ask your health care provider if you are at risk. Ask your health care provider about whether you are at high risk for HIV.  Your health care provider may recommend a prescription medicine to help prevent HIV infection. If you choose to take medicine to prevent HIV, you should first get tested for HIV. You should then be tested every 3 months for as long as you are taking the medicine. Follow these instructions at home: Alcohol use Do not drink alcohol if your health care provider tells you not to drink. If you drink alcohol: Limit how much you have to 0-2 drinks a day. Know how much alcohol is in your drink. In the [place] one drink equals one 12 oz bottle of beer (355 mL), one 5 oz glass of wine (148 mL), or one 1 oz glass of hard liquor (44 mL). Lifestyle Do not use any products that contain nicotine or tobacco. These products include cigarettes, chewing tobacco, and vaping devices, such as e-cigarettes. If you need help quitting, ask your health care provider. Do not use street drugs. Do not share needles. Ask your health care provider for help if you need support or information about quitting drugs. General instructions Schedule regular health, dental, and eye exams. Stay current with your vaccines. Tell your health care provider if: You often feel depressed. You have ever been abused or do not feel safe at home. Summary Adopting a healthy lifestyle and getting preventive care are important in promoting health and wellness. Follow your health care provider's instructions about healthy diet, exercising, and getting tested or screened for diseases. Follow your health care provider's instructions on monitoring your cholesterol and blood pressure. This information is not intended to replace advice given to you by your health care provider. Make sure you discuss any questions you have with your health care provider. Document Revised: 04/24/2021 Document Reviewed: 04/24/2021 Elsevier Patient Education  2024 [employer].

## 2023-05-21 NOTE — Progress Notes (Signed)
Subjective:  Patient ID: Jason Phillips, male    DOB: Jul 24, 1981  Age: 42 y.o. MRN: [id number]  CC: Annual Exam, Hypertension, Hyperlipidemia, and Diabetes   HPI VAIL ARMOR presents for a CPX and f/up ---  He complains of recent tick bites in his groin.  He denies rash, lymphadenopathy, fever, chills, night sweats, or arthralgias.  Outpatient Medications Prior to Visit  Medication Sig Dispense Refill   blood glucose meter kit and supplies Dispense based on patient and insurance preference. Use up to four times daily as directed. (FOR ICD-10 E10.9, E11.9). 1 each 0   omega-3 acid ethyl esters (LOVAZA) 1 g capsule Take 2 capsules (2 g total) by mouth 2 (two) times daily. 120 capsule 0   tiZANidine (ZANAFLEX) 2 MG tablet Take 1 tablet (2 mg total) by mouth at bedtime. 30 tablet 0   Ubrogepant (UBRELVY) 100 MG TABS Take 1 tablet by mouth as needed. 8 tablet 1   zolpidem (AMBIEN) 5 MG tablet Take 1 tablet (5 mg total) by mouth at bedtime as needed for sleep. 90 tablet 1   fenofibrate 160 MG tablet Take 1 tablet (160 mg total) by mouth daily. 30 tablet 0   FLUoxetine (PROZAC) 20 MG capsule Take 2 capsules (40 mg total) by mouth daily. 60 capsule 0   Pitavastatin Calcium (LIVALO) 2 MG TABS Take 1 tablet (2 mg total) by mouth daily. 30 tablet 0   No facility-administered medications prior to visit.    ROS Review of Systems  Constitutional: Negative.  Negative for chills, diaphoresis, fatigue and fever.  HENT: Negative.    Eyes: Negative.   Respiratory: Negative.  Negative for cough, chest tightness, shortness of breath and wheezing.   Cardiovascular:  Negative for chest pain, palpitations and leg swelling.  Gastrointestinal:  Negative for abdominal pain, diarrhea, nausea and vomiting.  Endocrine: Negative.   Genitourinary: Negative.  Negative for difficulty urinating, penile pain, penile swelling, scrotal swelling and testicular pain.  Musculoskeletal: Negative.  Negative  for arthralgias, joint swelling and myalgias.  Skin: Negative.  Negative for color change, pallor and rash.  Neurological:  Negative for dizziness, weakness, light-headedness and headaches.  Hematological:  Negative for adenopathy. Does not bruise/bleed easily.  Psychiatric/Behavioral: Negative.      Objective:  BP 138/86 (BP Location: Right Arm, Patient Position: Sitting, Cuff Size: Normal)   Pulse 67   Temp 98.4 F (36.9 C) (Oral)   Resp 16   Ht 5\' 8"  (1.727 m)   Wt 198 lb (89.8 kg)   SpO2 97%   BMI 30.11 kg/m   BP Readings from Last 3 Encounters:  05/21/23 138/86  03/19/23 128/86  01/14/23 122/82    Wt Readings from Last 3 Encounters:  05/21/23 198 lb (89.8 kg)  03/19/23 206 lb (93.4 kg)  01/14/23 199 lb 12.8 oz (90.6 kg)    Physical Exam Vitals reviewed.  Constitutional:      Appearance: He is not ill-appearing.  HENT:     Nose: Nose normal.     Mouth/Throat:     Mouth: Mucous membranes are moist.  Eyes:     General: No scleral icterus.    Conjunctiva/sclera: Conjunctivae normal.  Cardiovascular:     Rate and Rhythm: Normal rate and regular rhythm.     Heart sounds: Murmur heard.     Systolic murmur is present with a grade of 1/6.     No friction rub. No gallop.     Comments: EKG- NSR, 72 bpm  No LVH or Q waves Normal EKG Pulmonary:     Effort: Pulmonary effort is normal.     Breath sounds: No stridor. No wheezing, rhonchi or rales.  Abdominal:     General: Abdomen is flat.     Palpations: There is no mass.     Tenderness: There is no abdominal tenderness. There is no guarding or rebound.     Hernia: No hernia is present.  Musculoskeletal:     Cervical back: Neck supple.     Right lower leg: No edema.     Left lower leg: No edema.  Lymphadenopathy:     Cervical: No cervical adenopathy.  Skin:    General: Skin is warm and dry.     Coloration: Skin is not pale.     Findings: No erythema or rash.  Neurological:     General: No focal deficit  present.     Mental Status: He is alert. Mental status is at baseline.  Psychiatric:        Mood and Affect: Mood normal.        Behavior: Behavior normal.        Thought Content: Thought content normal.        Judgment: Judgment normal.     Lab Results  Component Value Date   WBC 8.6 05/21/2023   HGB 12.9 (L) 05/21/2023   HCT 37.9 (L) 05/21/2023   PLT 303.0 05/21/2023   GLUCOSE 128 (H) 05/03/2023   CHOL 233 (H) 05/21/2023   TRIG (H) 05/21/2023    481.0 Triglyceride is over 400; calculations on Lipids are invalid.   HDL 56.20 05/21/2023   LDLDIRECT 112.0 05/21/2023   LDLCALC 124 (H) 02/19/2020   ALT 34 05/03/2023   AST 30 05/03/2023   NA 144 05/03/2023   K 4.0 05/03/2023   CL 109 05/03/2023   CREATININE 0.87 05/03/2023   BUN 14 05/03/2023   CO2 26 05/03/2023   TSH 2.76 05/21/2023   INR 1.0 03/28/2008   HGBA1C 6.6 (H) 05/03/2023   MICROALBUR <0.7 05/03/2023    ECHOCARDIOGRAM COMPLETE  Result Date: 03/05/2022    ECHOCARDIOGRAM REPORT   Patient Name:   Jason Phillips Date of Exam: 03/05/2022 Medical Rec #:  [id number]           Height:       67.0 in Accession #:    [id number]          Weight:       209.4 lb Date of Birth:  August 29, 1981           BSA:          2.062 m Patient Age:    40 years            BP:           125/82 mmHg Patient Gender: M                   HR:           67 bpm. Exam Location:  Inpatient Procedure: 2D Echo, Cardiac Doppler and Color Doppler Indications:    R01.1 MURMUR  History:        Patient has no prior history of Echocardiogram examinations.                 Risk Factors:Dyslipidemia. GERD.  Sonographer:    Festus Barren Referring Phys: [id number] SAIMA RIZWAN IMPRESSIONS  1. Left ventricular ejection fraction, by estimation, is 60 to 65%. The left  ventricle has normal function. The left ventricle has no regional wall motion abnormalities. Left ventricular diastolic parameters were normal.  2. Right ventricular systolic function is normal. The right  ventricular size is normal.  3. Left atrial size was moderately dilated.  4. The mitral valve is abnormal. Trivial mitral valve regurgitation. No evidence of mitral stenosis.  5. Mean gradient across AV 6 mmHg AVA > 2cm2 . The aortic valve is tricuspid. There is mild calcification of the aortic valve. There is mild thickening of the aortic valve. Aortic valve regurgitation is not visualized. Aortic valve sclerosis/calcification is present, without any evidence of aortic stenosis.  6. The inferior vena cava is normal in size with greater than 50% respiratory variability, suggesting right atrial pressure of 3 mmHg. FINDINGS  Left Ventricle: Left ventricular ejection fraction, by estimation, is 60 to 65%. The left ventricle has normal function. The left ventricle has no regional wall motion abnormalities. The left ventricular internal cavity size was normal in size. There is  no left ventricular hypertrophy. Left ventricular diastolic parameters were normal. Right Ventricle: The right ventricular size is normal. No increase in right ventricular wall thickness. Right ventricular systolic function is normal. Left Atrium: Left atrial size was moderately dilated. Right Atrium: Right atrial size was normal in size. Pericardium: There is no evidence of pericardial effusion. Mitral Valve: The mitral valve is abnormal. There is mild thickening of the mitral valve leaflet(s). There is mild calcification of the mitral valve leaflet(s). Trivial mitral valve regurgitation. No evidence of mitral valve stenosis. Tricuspid Valve: The tricuspid valve is normal in structure. Tricuspid valve regurgitation is not demonstrated. No evidence of tricuspid stenosis. Aortic Valve: Mean gradient across AV 6 mmHg AVA > 2cm2. The aortic valve is tricuspid. There is mild calcification of the aortic valve. There is mild thickening of the aortic valve. Aortic valve regurgitation is not visualized. Aortic valve sclerosis/calcification is present,  without any evidence of aortic stenosis. Aortic valve mean gradient measures 6.0 mmHg. Aortic valve peak gradient measures 10.8 mmHg. Aortic valve area, by VTI measures 1.98 cm. Pulmonic Valve: The pulmonic valve was normal in structure. Pulmonic valve regurgitation is not visualized. No evidence of pulmonic stenosis. Aorta: The aortic root is normal in size and structure. Venous: The inferior vena cava is normal in size with greater than 50% respiratory variability, suggesting right atrial pressure of 3 mmHg. IAS/Shunts: No atrial level shunt detected by color flow Doppler.  LEFT VENTRICLE PLAX 2D LVIDd:         4.60 cm      Diastology LVIDs:         2.70 cm      LV e' medial:    9.57 cm/s LV PW:         1.20 cm      LV E/e' medial:  11.7 LV IVS:        1.10 cm      LV e' lateral:   15.10 cm/s LVOT diam:     1.70 cm      LV E/e' lateral: 7.4 LV SV:         74 LV SV Index:   36 LVOT Area:     2.27 cm  LV Volumes (MOD) LV vol d, MOD A2C: 77.2 ml LV vol d, MOD A4C: 103.0 ml LV vol s, MOD A2C: 27.2 ml LV vol s, MOD A4C: 38.6 ml LV SV MOD A2C:     50.0 ml LV SV MOD A4C:  103.0 ml LV SV MOD BP:      58.7 ml RIGHT VENTRICLE             IVC RV S prime:     13.90 cm/s  IVC diam: 2.00 cm TAPSE (M-mode): 2.6 cm LEFT ATRIUM             Index        RIGHT ATRIUM           Index LA diam:        5.00 cm 2.42 cm/m   RA Area:     11.90 cm LA Vol (A2C):   72.8 ml 35.30 ml/m  RA Volume:   25.50 ml  12.37 ml/m LA Vol (A4C):   96.2 ml 46.65 ml/m LA Biplane Vol: 87.6 ml 42.48 ml/m  AORTIC VALVE                     PULMONIC VALVE AV Area (Vmax):    2.30 cm      PV Vmax:       0.75 m/s AV Area (Vmean):   2.04 cm      PV Vmean:      43.000 cm/s AV Area (VTI):     1.98 cm      PV VTI:        0.158 m AV Vmax:           164.00 cm/s   PV Peak grad:  2.2 mmHg AV Vmean:          117.000 cm/s  PV Mean grad:  1.0 mmHg AV VTI:            0.372 m AV Peak Grad:      10.8 mmHg AV Mean Grad:      6.0 mmHg LVOT Vmax:         166.00 cm/s  LVOT Vmean:        105.000 cm/s LVOT VTI:          0.325 m LVOT/AV VTI ratio: 0.87  AORTA Ao Root diam: 3.00 cm Ao Asc diam:  3.10 cm MITRAL VALVE MV Area (PHT): 2.76 cm     SHUNTS MV Decel Time: 275 msec     Systemic VTI:  0.32 m MR Peak grad: 83.5 mmHg     Systemic Diam: 1.70 cm MR Mean grad: 54.0 mmHg MR Vmax:      457.00 cm/s MR Vmean:     348.0 cm/s MV E velocity: 112.00 cm/s MV A velocity: 63.00 cm/s MV E/A ratio:  1.78 Charlton Haws MD Electronically signed by Charlton Haws MD Signature Date/Time: 03/05/2022/9:57:48 AM    Final    DG Chest Portable 1 View  Result Date: 03/04/2022 CLINICAL DATA:  Sepsis workup.  Left knee drained 4 days ago. EXAM: PORTABLE CHEST 1 VIEW COMPARISON:  December 11, 2017 FINDINGS: The heart size and mediastinal contours are within normal limits. Both lungs are clear. The visualized skeletal structures are unremarkable. IMPRESSION: No active disease. Electronically Signed   By: Gerome Sam III M.D.   On: 03/04/2022 16:13   DG Knee Complete 4 Views Left  Result Date: 03/04/2022 CLINICAL DATA:  LEFT knee pain and fever. LEFT knee drained 4 days ago. Initial encounter. EXAM: LEFT KNEE - COMPLETE 4+ VIEW COMPARISON:  02/28/2022 radiograph FINDINGS: There is no evidence of acute fracture, subluxation or dislocation. No radiographic evidence of acute osteomyelitis noted. Prepatellar soft tissue swelling is again noted. An equivocal small effusion in  the suprapatellar bursa may be present. IMPRESSION: 1. Prepatellar soft tissue swelling and equivocal small knee effusion. 2. No evidence of acute bony abnormality or radiographic evidence of acute osteomyelitis. 3. No other significant change. Electronically Signed   By: Harmon Pier M.D.   On: 03/04/2022 14:15    Assessment & Plan:   Type II diabetes mellitus with manifestations (HCC)- His blood sugar is adequately well-controlled. -     HM Diabetes Foot Exam  Hypertriglyceridemia- Will restart the fenofibrate. -      Fenofibrate; Take 1 tablet (160 mg total) by mouth daily.  Dispense: 90 tablet; Refill: 1 -     CK; Future  Dyslipidemia, goal LDL below 130 -     Pitavastatin Calcium; Take 1 tablet (2 mg total) by mouth daily.  Dispense: 90 tablet; Refill: 1 -     Lipid panel; Future -     CK; Future  Episodic mood disorder (HCC) -     FLUoxetine HCl; Take 2 capsules (40 mg total) by mouth daily.  Dispense: 180 capsule; Refill: 1 -     TSH; Future  Tick bite of pelvic region, initial encounter - There is no evidence of tickborne illness. -     B. burgdorfi antibodies by WB; Future -     CBC with Differential/Platelet; Future  Encounter for general adult medical examination with abnormal findings- Exam completed, labs reviewed, vaccines are up-to-date, no cancer screenings indicated, patient education was given.  Hypertension associated with diabetes (HCC)- He has not achieved his blood pressure goal of 130/80.  Will start an ARB. -     TSH; Future -     CBC with Differential/Platelet; Future -     EKG 12-Lead -     Olmesartan Medoxomil; Take 1 tablet (20 mg total) by mouth daily.  Dispense: 90 tablet; Refill: 1  Nonrheumatic mitral valve regurgitation  Other orders -     LDL cholesterol, direct     Follow-up: Return in about 6 months (around 11/20/2023).  Sanda Linger, MD

## 2023-05-22 ENCOUNTER — Other Ambulatory Visit (HOSPITAL_COMMUNITY): Payer: Self-pay

## 2023-05-22 ENCOUNTER — Other Ambulatory Visit: Payer: Self-pay

## 2023-05-22 DIAGNOSIS — N201 Calculus of ureter: Secondary | ICD-10-CM | POA: Diagnosis not present

## 2023-05-22 DIAGNOSIS — N200 Calculus of kidney: Secondary | ICD-10-CM | POA: Diagnosis not present

## 2023-05-22 DIAGNOSIS — K5730 Diverticulosis of large intestine without perforation or abscess without bleeding: Secondary | ICD-10-CM | POA: Diagnosis not present

## 2023-05-22 DIAGNOSIS — K760 Fatty (change of) liver, not elsewhere classified: Secondary | ICD-10-CM | POA: Diagnosis not present

## 2023-05-22 DIAGNOSIS — N281 Cyst of kidney, acquired: Secondary | ICD-10-CM | POA: Diagnosis not present

## 2023-05-22 MED ORDER — KETOROLAC TROMETHAMINE 10 MG PO TABS
ORAL_TABLET | ORAL | 0 refills | Status: DC
  Filled 2023-05-22: qty 15, 5d supply, fill #0

## 2023-05-22 MED ORDER — TAMSULOSIN HCL 0.4 MG PO CAPS
0.4000 mg | ORAL_CAPSULE | Freq: Every day | ORAL | 0 refills | Status: DC
  Filled 2023-05-22: qty 30, 30d supply, fill #0

## 2023-05-23 LAB — B. BURGDORFI ANTIBODIES BY WB

## 2023-05-29 ENCOUNTER — Other Ambulatory Visit (HOSPITAL_COMMUNITY): Payer: Self-pay

## 2023-05-29 ENCOUNTER — Other Ambulatory Visit: Payer: Self-pay | Admitting: Internal Medicine

## 2023-05-29 DIAGNOSIS — E781 Pure hyperglyceridemia: Secondary | ICD-10-CM

## 2023-05-29 MED ORDER — OMEGA-3-ACID ETHYL ESTERS 1 G PO CAPS
2.0000 | ORAL_CAPSULE | Freq: Two times a day (BID) | ORAL | 1 refills | Status: DC
Start: 2023-05-29 — End: 2024-01-14
  Filled 2023-05-29: qty 360, 90d supply, fill #0
  Filled 2023-09-19: qty 360, 90d supply, fill #1

## 2023-06-04 ENCOUNTER — Other Ambulatory Visit (HOSPITAL_COMMUNITY): Payer: Self-pay

## 2023-06-05 DIAGNOSIS — N202 Calculus of kidney with calculus of ureter: Secondary | ICD-10-CM | POA: Diagnosis not present

## 2023-06-14 NOTE — Progress Notes (Unsigned)
[facility] [address] [place] [address] Phone: [phone] Subjective:   Jason Phillips, am serving as a scribe for Dr. Antoine Primas.  I'm seeing this patient by the request  of:  Etta Grandchild, MD  CC: neck and back pain follow up   LKG:[id number]  Jason Phillips is a 42 y.o. male coming in with complaint of back and neck pain. OMT 03/19/2023. Patient states that he has been having intermittent back pain.  Nothing that stopping him from activity at the moment.  Can be uncomfortable at night.         Reviewed prior external information including notes and imaging from previsou exam, outside providers and external EMR if available.   As well as notes that were available from care everywhere and other healthcare systems.  Past medical history, social, surgical and family history all reviewed in electronic medical record.  No pertanent information unless stated regarding to the chief complaint.   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 POSITIVE muscle aches  Objective  Blood pressure 120/82, pulse 71, height 5\' 8"  (1.727 m), weight 205 lb (93 kg), SpO2 96 %.   General: No apparent distress alert and oriented x3 mood and affect normal, dressed appropriately.  HEENT: Pupils equal, extraocular movements intact  Respiratory:  Patient's speak in full sentences and does not appear short of breath  Cardiovascular: No lower extremity edema, non tender, no erythema  Back exam does have some loss of lordosis noted.  Some tenderness to palpation in the paraspinal musculature.  Osteopathic findings  C2 flexed rotated and side bent right C7 flexed rotated and side bent left T3 extended rotated and side bent right inhaled rib T8 extended rotated and side bent left L2 flexed rotated and side bent right L5 flexed rotated and side bent left Sacrum right on right    Assessment and Plan:  Low back pain Chronic problem, discussed icing regimen and home exercises.  Encouraged him to do more of the stretching.  Will need to continue to work on core strengthening exercises.  Follow-up again in  8 weeks    Nonallopathic problems  Decision today to treat with OMT was based on Physical Exam  After verbal consent patient was treated with HVLA, ME, FPR techniques in cervical, rib, thoracic, lumbar, and sacral  areas  Patient tolerated the procedure well with improvement in symptoms  Patient given exercises, stretches and lifestyle modifications  See medications in patient instructions if given  Patient will follow up in 8 weeks    The above documentation has been reviewed and is accurate and complete Judi Saa, DO          Note: This dictation was prepared with Dragon dictation along with smaller phrase technology. Any transcriptional  errors that result from this process are unintentional.

## 2023-06-18 ENCOUNTER — Ambulatory Visit: Payer: Commercial Managed Care - PPO | Admitting: Family Medicine

## 2023-06-18 ENCOUNTER — Encounter: Payer: Self-pay | Admitting: Family Medicine

## 2023-06-18 VITALS — BP 120/82 | HR 71 | Ht 68.0 in | Wt 205.0 lb

## 2023-06-18 DIAGNOSIS — M9904 Segmental and somatic dysfunction of sacral region: Secondary | ICD-10-CM | POA: Diagnosis not present

## 2023-06-18 DIAGNOSIS — M9902 Segmental and somatic dysfunction of thoracic region: Secondary | ICD-10-CM | POA: Diagnosis not present

## 2023-06-18 DIAGNOSIS — M9901 Segmental and somatic dysfunction of cervical region: Secondary | ICD-10-CM

## 2023-06-18 DIAGNOSIS — M9903 Segmental and somatic dysfunction of lumbar region: Secondary | ICD-10-CM | POA: Diagnosis not present

## 2023-06-18 DIAGNOSIS — M9908 Segmental and somatic dysfunction of rib cage: Secondary | ICD-10-CM | POA: Diagnosis not present

## 2023-06-18 DIAGNOSIS — M545 Low back pain, unspecified: Secondary | ICD-10-CM

## 2023-06-18 DIAGNOSIS — G8929 Other chronic pain: Secondary | ICD-10-CM

## 2023-06-18 NOTE — Patient Instructions (Signed)
Wish you best with the family See me in 10-12 weeks

## 2023-06-18 NOTE — Assessment & Plan Note (Signed)
Chronic problem, discussed icing regimen and home exercises.  Encouraged him to do more of the stretching.  Will need to continue to work on core strengthening exercises.  Follow-up again in  8 weeks

## 2023-06-24 DIAGNOSIS — N200 Calculus of kidney: Secondary | ICD-10-CM | POA: Diagnosis not present

## 2023-08-22 ENCOUNTER — Other Ambulatory Visit (HOSPITAL_COMMUNITY): Payer: Self-pay

## 2023-08-26 NOTE — Progress Notes (Unsigned)
[name] [address] [place] [address] Phone: [phone] Subjective:   [name], am serving as a scribe for Dr. Antoine Primas.  I'm seeing this patient by the request  of:  Etta Grandchild, MD  CC: Back and neck pain follow-up [id number]  KYSHAWN TRINDLE is a 42 y.o. male coming in with complaint of back and neck pain. OMT 06/18/2203. Patient states that he is doing well since last visit.  Patient has been unfortunately having to take care of his dad who has been in and out of hospital recently.  Medications patient has been prescribed: Zanaflex  Taking: Yes         Reviewed prior external information including notes and imaging from previsou exam, outside providers and external EMR if available.   As well as notes that were available from care everywhere and other healthcare systems.  Past medical history, social, surgical and family history all reviewed in electronic medical record.  No pertanent information unless stated regarding to the chief complaint.   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 POSITIVE muscle aches does complain of some fasciculations of muscle with lack of sleep   Objective  Blood pressure 124/84, pulse 66, height 5\' 8"  (1.727 m), weight 208 lb (94.3 kg), SpO2 98%.   General: No apparent  distress alert and oriented x3 mood and affect normal, dressed appropriately.  HEENT: Pupils equal, extraocular movements intact  Respiratory: Patient's speak in full sentences and does not appear short of breath  Cardiovascular: No lower extremity edema, non tender, no erythema  Gait mild antalgic but nothing severe. MSK:  Back loss lordosis noted. Patient neck exam significant tightness noted as well.  Osteopathic findings  C2 flexed rotated and side bent right C3 flexed rotated and side bent right C5 flexed rotated and side bent left C6 flexed rotated and side bent left T3 extended rotated and side bent right inhaled rib T7 extended rotated and side bent right T9 extended rotated and side bent left L2 flexed rotated and side bent right L5 flexed rotated and side bent left Sacrum right on right       Assessment and Plan:  Low back pain Low back pain that seems to still be mechanical.  Has not been sleeping and some excessive stress that I think is contributing to this as well as some of the increase in neck pain.  Discussed icing regimen and home exercises.  Discussed potentially finding time for himself when possible.  Increase activity follow-up again in 6 to 8 weeks.      Nonallopathic problems  Decision today to treat with OMT was based on Physical Exam  After verbal consent patient was treated with HVLA, ME, FPR techniques in cervical, rib, thoracic, lumbar, and sacral  areas  Patient tolerated the procedure well with improvement in symptoms  Patient given exercises, stretches and lifestyle modifications  See medications in patient instructions if given  Patient will follow up in 4-8 weeks     The above documentation has been reviewed and is accurate and complete Judi Saa, DO         Note: This dictation was prepared with Dragon dictation along with smaller phrase technology. Any transcriptional errors that result from this process are  unintentional.

## 2023-08-27 ENCOUNTER — Ambulatory Visit: Payer: Commercial Managed Care - PPO | Admitting: Family Medicine

## 2023-08-27 ENCOUNTER — Encounter: Payer: Self-pay | Admitting: Family Medicine

## 2023-08-27 VITALS — BP 124/84 | HR 66 | Ht 68.0 in | Wt 208.0 lb

## 2023-08-27 DIAGNOSIS — M9903 Segmental and somatic dysfunction of lumbar region: Secondary | ICD-10-CM

## 2023-08-27 DIAGNOSIS — M9904 Segmental and somatic dysfunction of sacral region: Secondary | ICD-10-CM | POA: Diagnosis not present

## 2023-08-27 DIAGNOSIS — M9908 Segmental and somatic dysfunction of rib cage: Secondary | ICD-10-CM

## 2023-08-27 DIAGNOSIS — M9902 Segmental and somatic dysfunction of thoracic region: Secondary | ICD-10-CM

## 2023-08-27 DIAGNOSIS — M9901 Segmental and somatic dysfunction of cervical region: Secondary | ICD-10-CM | POA: Diagnosis not present

## 2023-08-27 DIAGNOSIS — M545 Low back pain, unspecified: Secondary | ICD-10-CM | POA: Diagnosis not present

## 2023-08-27 DIAGNOSIS — G8929 Other chronic pain: Secondary | ICD-10-CM | POA: Diagnosis not present

## 2023-08-27 NOTE — Assessment & Plan Note (Signed)
Low back pain that seems to still be mechanical.  Has not been sleeping and some excessive stress that I think is contributing to this as well as some of the increase in neck pain.  Discussed icing regimen and home exercises.  Discussed potentially finding time for himself when possible.  Increase activity follow-up again in 6 to 8 weeks.

## 2023-08-27 NOTE — Patient Instructions (Signed)
Great to see you If you need anything you know where we are See me in 5-6 weeks

## 2023-09-02 ENCOUNTER — Other Ambulatory Visit (HOSPITAL_COMMUNITY): Payer: Self-pay

## 2023-09-05 ENCOUNTER — Other Ambulatory Visit (HOSPITAL_COMMUNITY): Payer: Self-pay

## 2023-09-05 DIAGNOSIS — N200 Calculus of kidney: Secondary | ICD-10-CM | POA: Diagnosis not present

## 2023-09-05 DIAGNOSIS — R82994 Hypercalciuria: Secondary | ICD-10-CM | POA: Diagnosis not present

## 2023-09-05 MED ORDER — HYDROCHLOROTHIAZIDE 12.5 MG PO CAPS
12.5000 mg | ORAL_CAPSULE | Freq: Every day | ORAL | 3 refills | Status: DC
  Filled 2023-09-05: qty 30, 30d supply, fill #0
  Filled 2023-10-02: qty 30, 30d supply, fill #1
  Filled 2023-11-02: qty 30, 30d supply, fill #2

## 2023-09-06 ENCOUNTER — Telehealth: Payer: Self-pay | Admitting: Pharmacy Technician

## 2023-09-06 NOTE — Telephone Encounter (Signed)
Pharmacy Patient Advocate Encounter   Received notification from CoverMyMeds that prior authorization for Ubrelvy 100MG  tablets is required/requested.   Insurance verification completed.   The patient is insured through [facility] .   Per test claim: PA required; PA submitted to [facility] via CoverMyMeds Key/confirmation #/EOC [id number] Status is pending

## 2023-09-10 NOTE — Telephone Encounter (Signed)
Pharmacy Patient Advocate Encounter  Received notification from [facility] that Prior Authorization for Ubrelvy 100MG  tablets has been APPROVED from 09-06-2023 to 09-05-2024   PA #/Case ID/Reference #: [id number]

## 2023-09-24 NOTE — Progress Notes (Signed)
  [facility] [address] [place] [address] Phone: [phone] Subjective:   Jason Phillips, am serving as a scribe for Dr. Antoine Phillips.  I'm seeing this patient by the request  of:  Jason Grandchild, MD  CC: Low back pain acutely worsened  Jason Phillips  Jason Phillips is a 42 y.o. male coming in with complaint of back and neck pain. OMT 08/27/2023. Patient states that he has had an increase in pain on L side of lower back that is radiating up the entire spine. Taking IBU which is helping somewhat. A lot of pain at night.   Medications patient has been prescribed: None          Reviewed prior external information including notes and imaging from previsou exam, outside providers and external EMR if available.   As well as notes that were available from care everywhere and other healthcare systems.  Past medical history, social, surgical and family history all reviewed in electronic medical record.  No pertanent information unless stated regarding to the chief complaint.   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 POSITIVE muscle aches  Objective  Blood pressure 124/84, pulse (!) 59, height 5\' 8"  (1.727 m), weight 208 lb (94.3 kg), SpO2 98%.   General: No apparent distress alert and oriented x3 mood and affect normal, dressed  appropriately.  HEENT: Pupils equal, extraocular movements intact  Respiratory: Patient's speak in full sentences and does not appear short of breath  Cardiovascular: No lower extremity edema, non tender, no erythema  Low back has significant loss of lordosis noted.  Tenderness to palpation in the paraspinal musculature.  Tightness noted severely left greater than right with multiple trigger points.  Negative straight leg test.  Unfortunately unable to do full testing secondary to the tightness at the moment.        Assessment and Plan:  Low back pain Low back does have some loss of lordosis noted.  Some tenderness to palpation in the paraspinal musculature.  Discussed icing regimen and home exercises.  Toradol and Depo-Medrol given today.  Unable to tolerate the osteopathic manipulation today.  Can do a short course of meloxicam as well.  Worsening pain will see [place] sooner.  Discussed with patient to watch for other signs such as dysuria but patient declined any testing on that today.  Follow-up again with me in 2 to 4 weeks        The above documentation has been reviewed and is accurate and complete Jason Saa, DO         Note: This dictation was prepared with Dragon dictation along with smaller phrase technology. Any transcriptional errors that result from this process are unintentional.

## 2023-09-29 ENCOUNTER — Other Ambulatory Visit: Payer: Self-pay | Admitting: Internal Medicine

## 2023-09-29 ENCOUNTER — Other Ambulatory Visit: Payer: Self-pay | Admitting: Family Medicine

## 2023-09-29 MED ORDER — FREESTYLE LITE TEST VI STRP
1.0000 | ORAL_STRIP | Freq: Four times a day (QID) | 0 refills | Status: AC
  Filled 2023-09-29 – 2023-09-30 (×5): qty 100, 25d supply, fill #0

## 2023-09-30 ENCOUNTER — Other Ambulatory Visit (HOSPITAL_COMMUNITY): Payer: Self-pay

## 2023-09-30 MED ORDER — TIZANIDINE HCL 2 MG PO TABS
2.0000 mg | ORAL_TABLET | Freq: Every day | ORAL | 0 refills | Status: DC
  Filled 2023-09-30: qty 30, 30d supply, fill #0

## 2023-10-01 ENCOUNTER — Ambulatory Visit: Payer: Commercial Managed Care - PPO | Admitting: Family Medicine

## 2023-10-01 ENCOUNTER — Encounter: Payer: Self-pay | Admitting: Family Medicine

## 2023-10-01 VITALS — BP 124/84 | HR 59 | Ht 68.0 in | Wt 208.0 lb

## 2023-10-01 DIAGNOSIS — M545 Low back pain, unspecified: Secondary | ICD-10-CM

## 2023-10-01 DIAGNOSIS — M9904 Segmental and somatic dysfunction of sacral region: Secondary | ICD-10-CM | POA: Diagnosis not present

## 2023-10-01 DIAGNOSIS — M9902 Segmental and somatic dysfunction of thoracic region: Secondary | ICD-10-CM

## 2023-10-01 DIAGNOSIS — M9903 Segmental and somatic dysfunction of lumbar region: Secondary | ICD-10-CM

## 2023-10-01 DIAGNOSIS — M9901 Segmental and somatic dysfunction of cervical region: Secondary | ICD-10-CM

## 2023-10-01 DIAGNOSIS — G8929 Other chronic pain: Secondary | ICD-10-CM

## 2023-10-01 MED ORDER — METHYLPREDNISOLONE ACETATE 80 MG/ML IJ SUSP
80.0000 mg | Freq: Once | INTRAMUSCULAR | Status: AC
Start: 2023-10-01 — End: 2023-10-01
  Administered 2023-10-01: 80 mg via INTRAMUSCULAR

## 2023-10-01 MED ORDER — KETOROLAC TROMETHAMINE 60 MG/2ML IM SOLN
60.0000 mg | Freq: Once | INTRAMUSCULAR | Status: AC
Start: 2023-10-01 — End: 2023-10-01
  Administered 2023-10-01: 60 mg via INTRAMUSCULAR

## 2023-10-01 NOTE — Patient Instructions (Signed)
Cocktail today See me in 4-5 weeks

## 2023-10-01 NOTE — Assessment & Plan Note (Signed)
Low back does have some loss of lordosis noted.  Some tenderness to palpation in the paraspinal musculature.  Discussed icing regimen and home exercises.  Toradol and Depo-Medrol given today.  Unable to tolerate the osteopathic manipulation today.  Can do a short course of meloxicam as well.  Worsening pain will see [place] sooner.  Discussed with patient to watch for other signs such as dysuria but patient declined any testing on that today.  Follow-up again with me in 2 to 4 weeks

## 2023-10-03 ENCOUNTER — Other Ambulatory Visit (HOSPITAL_COMMUNITY): Payer: Self-pay

## 2023-10-11 DIAGNOSIS — N200 Calculus of kidney: Secondary | ICD-10-CM | POA: Diagnosis not present

## 2023-10-31 NOTE — Progress Notes (Deleted)
  [name] [address] [place] [address] Phone: [phone] Subjective:    I'm seeing this patient by the request  of:  Etta Grandchild, MD  CC:   [id number]  Jason Phillips is a 42 y.o. male coming in with complaint of back and neck pain. OMT 10/01/2023. Patient states   Medications patient has been prescribed: None  Taking:         Reviewed prior external information including notes and imaging from previsou exam, outside providers and external EMR if available.   As well as notes that were available from care everywhere and other healthcare systems.  Past medical history, social, surgical and family history all reviewed in electronic medical record.  No pertanent information unless stated regarding to the chief complaint.   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 POSITIVE muscle aches  Objective  There were no vitals taken for this visit.   General: No apparent distress alert and oriented x3 mood and affect normal, dressed appropriately.  HEENT: Pupils equal, extraocular movements intact  Respiratory: Patient's speak in full sentences and does not appear short of breath  Cardiovascular: No lower extremity edema, non tender, no erythema  Gait MSK:  Back   Osteopathic findings  C2 flexed rotated and side bent  right C6 flexed rotated and side bent left T3 extended rotated and side bent right inhaled rib T9 extended rotated and side bent left L2 flexed rotated and side bent right Sacrum right on right       Assessment and Plan:  No problem-specific Assessment & Plan notes found for this encounter.    Nonallopathic problems  Decision today to treat with OMT was based on Physical Exam  After verbal consent patient was treated with HVLA, ME, FPR techniques in cervical, rib, thoracic, lumbar, and sacral  areas  Patient tolerated the procedure well with improvement in symptoms  Patient given exercises, stretches and lifestyle modifications  See medications in patient instructions if given  Patient will follow up in 4-8 weeks             Note: This dictation was prepared with Dragon dictation along with smaller phrase technology. Any transcriptional errors that result from this process are unintentional.

## 2023-11-04 ENCOUNTER — Other Ambulatory Visit (HOSPITAL_COMMUNITY): Payer: Self-pay

## 2023-11-04 ENCOUNTER — Ambulatory Visit: Payer: Commercial Managed Care - PPO | Admitting: Family Medicine

## 2023-11-21 NOTE — Progress Notes (Signed)
  [name] [address] [place] [address] Phone: [phone] Subjective:   Jason Phillips, am serving as a scribe for Dr. Antoine Primas.  I'm seeing this patient by the request  of:  Etta Grandchild, MD  CC: Patient having back and neck pain  [id number]  Jason Phillips is a 42 y.o. male coming in with complaint of back and neck pain. OMT on 10/01/2023. Patient states that his lower back is bothering him today. Had to use mm relaxer more recently.   Medications patient has been prescribed: Zanaflex  Taking:         Reviewed prior external information including notes and imaging from previsou exam, outside providers and external EMR if available.   As well as notes that were available from care everywhere and other healthcare systems.  Past medical history, social, surgical and family history all reviewed in electronic medical record.  No pertanent information unless stated regarding to the chief complaint.   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 POSITIVE muscle aches  Objective  Blood pressure 124/86, pulse 76, height 5\' 8"  (1.727 m), weight 208 lb (94.3 kg), SpO2 98%.   General: No apparent distress alert and oriented x3 mood and affect normal, dressed appropriately.  HEENT: Pupils equal, extraocular movements  intact  Respiratory: Patient's speak in full sentences and does not appear short of breath  Cardiovascular: No lower extremity edema, non tender, no erythema  Gait relatively normal MSK:  Back does have some loss lordosis noted.  Some tenderness to palpation in the paraspinal musculature.  Tightness with Pearlean Brownie right greater than left  Osteopathic findings  C2 flexed rotated and side bent right C7 flexed rotated and side bent left T5 extended rotated and side bent right inhaled rib L2 flexed rotated and side bent right L3 flexed rotated and side bent left Sacrum right on right       Assessment and Plan:  No problem-specific Assessment & Plan notes found for this encounter.    Nonallopathic problems  Decision today to treat with OMT was based on Physical Exam  After verbal consent patient was treated with HVLA, ME, FPR techniques in cervical, rib, thoracic, lumbar, and sacral  areas  Patient tolerated the procedure well with improvement in symptoms  Patient given exercises, stretches and lifestyle modifications  See medications in patient instructions if given  Patient will follow up in 4-8 weeks     The above documentation has been reviewed and is accurate and complete Judi Saa, DO         Note: This dictation was prepared with Dragon dictation along with smaller phrase technology. Any transcriptional errors that result from this process are unintentional.

## 2023-11-22 ENCOUNTER — Other Ambulatory Visit (HOSPITAL_COMMUNITY): Payer: Self-pay

## 2023-11-22 ENCOUNTER — Other Ambulatory Visit: Payer: Self-pay | Admitting: Internal Medicine

## 2023-11-22 DIAGNOSIS — E781 Pure hyperglyceridemia: Secondary | ICD-10-CM

## 2023-11-22 MED ORDER — FENOFIBRATE 160 MG PO TABS
160.0000 mg | ORAL_TABLET | Freq: Every day | ORAL | 0 refills | Status: DC
  Filled 2023-11-22: qty 90, 90d supply, fill #0

## 2023-11-24 ENCOUNTER — Other Ambulatory Visit: Payer: Self-pay | Admitting: Internal Medicine

## 2023-11-24 DIAGNOSIS — E785 Hyperlipidemia, unspecified: Secondary | ICD-10-CM

## 2023-11-24 MED ORDER — PITAVASTATIN CALCIUM 2 MG PO TABS
1.0000 | ORAL_TABLET | Freq: Every day | ORAL | 0 refills | Status: DC
  Filled 2023-11-24 – 2023-11-26 (×2): qty 90, 90d supply, fill #0

## 2023-11-25 ENCOUNTER — Other Ambulatory Visit: Payer: Self-pay

## 2023-11-25 ENCOUNTER — Other Ambulatory Visit (HOSPITAL_COMMUNITY): Payer: Self-pay

## 2023-11-26 ENCOUNTER — Encounter: Payer: Self-pay | Admitting: Family Medicine

## 2023-11-26 ENCOUNTER — Other Ambulatory Visit: Payer: Self-pay | Admitting: Internal Medicine

## 2023-11-26 ENCOUNTER — Ambulatory Visit: Payer: Commercial Managed Care - PPO | Admitting: Family Medicine

## 2023-11-26 ENCOUNTER — Other Ambulatory Visit (HOSPITAL_COMMUNITY): Payer: Self-pay

## 2023-11-26 VITALS — BP 124/86 | HR 76 | Ht 68.0 in | Wt 208.0 lb

## 2023-11-26 DIAGNOSIS — M9901 Segmental and somatic dysfunction of cervical region: Secondary | ICD-10-CM

## 2023-11-26 DIAGNOSIS — G8929 Other chronic pain: Secondary | ICD-10-CM | POA: Diagnosis not present

## 2023-11-26 DIAGNOSIS — M9902 Segmental and somatic dysfunction of thoracic region: Secondary | ICD-10-CM | POA: Diagnosis not present

## 2023-11-26 DIAGNOSIS — M9903 Segmental and somatic dysfunction of lumbar region: Secondary | ICD-10-CM

## 2023-11-26 DIAGNOSIS — M9904 Segmental and somatic dysfunction of sacral region: Secondary | ICD-10-CM

## 2023-11-26 DIAGNOSIS — M545 Low back pain, unspecified: Secondary | ICD-10-CM | POA: Diagnosis not present

## 2023-11-26 DIAGNOSIS — M9908 Segmental and somatic dysfunction of rib cage: Secondary | ICD-10-CM

## 2023-11-26 DIAGNOSIS — F39 Unspecified mood [affective] disorder: Secondary | ICD-10-CM

## 2023-11-26 MED ORDER — FLUOXETINE HCL 20 MG PO CAPS
40.0000 mg | ORAL_CAPSULE | Freq: Every day | ORAL | 0 refills | Status: DC
  Filled 2023-11-26: qty 180, 90d supply, fill #0

## 2023-11-26 MED ORDER — TIZANIDINE HCL 2 MG PO TABS
2.0000 mg | ORAL_TABLET | Freq: Every day | ORAL | 0 refills | Status: DC
  Filled 2023-11-26: qty 30, 30d supply, fill #0

## 2023-11-26 NOTE — Patient Instructions (Signed)
Mm relaxer refilled See me again in 6-7 weeks

## 2023-11-26 NOTE — Assessment & Plan Note (Signed)
Chronic problem.  Discussed which activities to do and which ones to avoid.  Has had this pain now for greater than 9 years.  With exacerbation noted and we did discuss the Zanaflex 2 mg and he can take it nightly if needed.  Patient will follow-up with me again in 6 to 8 weeks

## 2023-11-27 ENCOUNTER — Other Ambulatory Visit (HOSPITAL_COMMUNITY): Payer: Self-pay

## 2023-11-29 ENCOUNTER — Other Ambulatory Visit (HOSPITAL_COMMUNITY): Payer: Self-pay

## 2023-12-04 DIAGNOSIS — R82994 Hypercalciuria: Secondary | ICD-10-CM | POA: Diagnosis not present

## 2023-12-04 DIAGNOSIS — N200 Calculus of kidney: Secondary | ICD-10-CM | POA: Diagnosis not present

## 2023-12-16 DIAGNOSIS — E119 Type 2 diabetes mellitus without complications: Secondary | ICD-10-CM | POA: Diagnosis not present

## 2023-12-16 DIAGNOSIS — H35413 Lattice degeneration of retina, bilateral: Secondary | ICD-10-CM | POA: Diagnosis not present

## 2023-12-16 DIAGNOSIS — H52223 Regular astigmatism, bilateral: Secondary | ICD-10-CM | POA: Diagnosis not present

## 2023-12-16 DIAGNOSIS — H5213 Myopia, bilateral: Secondary | ICD-10-CM | POA: Diagnosis not present

## 2023-12-16 DIAGNOSIS — H2513 Age-related nuclear cataract, bilateral: Secondary | ICD-10-CM | POA: Diagnosis not present

## 2023-12-16 LAB — HM DIABETES EYE EXAM

## 2023-12-17 ENCOUNTER — Encounter: Payer: Self-pay | Admitting: Osteopathic Medicine

## 2024-01-10 NOTE — Progress Notes (Unsigned)
[name] [address] [place] [address] Phone: [phone] Subjective:   INadine Counts, am serving as a scribe for Dr. Antoine Primas.  I'm seeing this patient by the request  of:  Etta Grandchild, MD  CC: back and neck pain follow up   [id number]  Jason Phillips is a 43 y.o. male coming in with complaint of back and neck pain. OMT 11/26/2023. Patient states doing well. Starting to have elbow problem again.  Medications patient has been prescribed: Zanaflex  Taking: Yes      Reviewed prior external information including notes and imaging from previsou exam, outside providers and external EMR if available.   As well as notes that were available from care everywhere and other healthcare systems.  Past medical history, social, surgical and family history all reviewed in electronic medical record.  No pertanent information unless stated regarding to the chief complaint.   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 POSITIVE muscle aches  Objective  Blood pressure 126/78, pulse 76, height 5\' 8"  (1.727 m), weight 211 lb (95.7 kg), SpO2 95%.   General: No apparent distress alert and oriented x3 mood and affect normal, dressed appropriately.  HEENT: Pupils equal, extraocular movements intact  Respiratory: Patient's speak in full  sentences and does not appear short of breath  Cardiovascular: No lower extremity edema, non tender, no erythema  Gait MSK:  Back does have some loss lordosis noted.  Some tenderness to palpation in the paraspinal musculature.  Significant increase in tightness with FABER test bilaterally.  Patient lacks last 5 degrees of extension.  Osteopathic findings  C2 flexed rotated and side bent right C6 flexed rotated and side bent left T3 extended rotated and side bent right inhaled rib T9 extended rotated and side bent left L2 flexed rotated and side bent right Sacrum right on right       Assessment and Plan:  Low back pain Chronic problem movements does have some tightness.  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  Discussed icing regimen and home exercises, which activities to do and which ones currently.  Increase activity slowly.  Follow-up again in 6 to 8 weeks.  Radial nerve entrapment, left Continue to monitor but if worsening symptoms will consider potential injection again.    Nonallopathic problems  Decision today to treat with OMT was based on Physical Exam  After verbal consent patient was treated with HVLA, ME, FPR techniques in cervical, rib, thoracic, lumbar, and sacral  areas  Patient tolerated the procedure well with improvement in symptoms  Patient given exercises, stretches and lifestyle modifications  See medications in patient instructions if given  Patient will follow up in 4-8 weeks  The above documentation has been reviewed and is accurate and complete Judi Saa, DO         Note: This dictation was prepared with Dragon dictation along with smaller phrase technology. Any transcriptional errors that result from this process are unintentional.

## 2024-01-13 ENCOUNTER — Encounter: Payer: Self-pay | Admitting: Family Medicine

## 2024-01-13 ENCOUNTER — Ambulatory Visit: Payer: Commercial Managed Care - PPO | Admitting: Family Medicine

## 2024-01-13 VITALS — BP 126/78 | HR 76 | Ht 68.0 in | Wt 211.0 lb

## 2024-01-13 DIAGNOSIS — M9901 Segmental and somatic dysfunction of cervical region: Secondary | ICD-10-CM | POA: Diagnosis not present

## 2024-01-13 DIAGNOSIS — M9902 Segmental and somatic dysfunction of thoracic region: Secondary | ICD-10-CM | POA: Diagnosis not present

## 2024-01-13 DIAGNOSIS — G8929 Other chronic pain: Secondary | ICD-10-CM | POA: Diagnosis not present

## 2024-01-13 DIAGNOSIS — M9903 Segmental and somatic dysfunction of lumbar region: Secondary | ICD-10-CM | POA: Diagnosis not present

## 2024-01-13 DIAGNOSIS — M9904 Segmental and somatic dysfunction of sacral region: Secondary | ICD-10-CM

## 2024-01-13 DIAGNOSIS — M9908 Segmental and somatic dysfunction of rib cage: Secondary | ICD-10-CM | POA: Diagnosis not present

## 2024-01-13 DIAGNOSIS — G5632 Lesion of radial nerve, left upper limb: Secondary | ICD-10-CM

## 2024-01-13 DIAGNOSIS — M545 Low back pain, unspecified: Secondary | ICD-10-CM

## 2024-01-13 NOTE — Assessment & Plan Note (Signed)
Chronic problem movements does have some tightness.  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  Discussed icing regimen and home exercises, which activities to do and which ones currently.  Increase activity slowly.  Follow-up again in 6 to 8 weeks.

## 2024-01-13 NOTE — Patient Instructions (Signed)
Good to see you! Try to stay warm and hold things underhand See you again in 6 weeks

## 2024-01-13 NOTE — Assessment & Plan Note (Signed)
Continue to monitor but if worsening symptoms will consider potential injection again.

## 2024-01-14 ENCOUNTER — Other Ambulatory Visit (HOSPITAL_COMMUNITY): Payer: Self-pay

## 2024-01-14 ENCOUNTER — Other Ambulatory Visit: Payer: Self-pay

## 2024-01-14 ENCOUNTER — Other Ambulatory Visit: Payer: Self-pay | Admitting: Internal Medicine

## 2024-01-14 DIAGNOSIS — E781 Pure hyperglyceridemia: Secondary | ICD-10-CM

## 2024-01-14 MED ORDER — OMEGA-3-ACID ETHYL ESTERS 1 G PO CAPS
2.0000 | ORAL_CAPSULE | Freq: Two times a day (BID) | ORAL | 0 refills | Status: DC
  Filled 2024-01-14: qty 360, 90d supply, fill #0

## 2024-01-15 ENCOUNTER — Other Ambulatory Visit (HOSPITAL_COMMUNITY): Payer: Self-pay

## 2024-02-21 NOTE — Progress Notes (Signed)
 [name] [address] [place] [address] Phone: [phone] Subjective:   Jason Phillips, am serving as a scribe for Dr. Antoine Primas.  I'm seeing this patient by the request  of:  Etta Grandchild, MD  CC: Back pain and elbow pain follow-up  [id number]  Jason Phillips is a 43 y.o. male coming in with complaint of back and neck pain. OMT 01/13/2024. Patient states that his back pain has been manageable due to being sick and not moving much. Elbow pain has also improved.   Medications patient has been prescribed: Zanaflex  Taking:         Reviewed prior external information including notes and imaging from previsou exam, outside providers and external EMR if available.   As well as notes that were available from care everywhere and other healthcare systems.  Past medical history, social, surgical and family history all reviewed in electronic medical record.  No pertanent information unless stated regarding to the chief complaint.   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 POSITIVE muscle aches  Objective  Blood pressure 118/72, pulse 64, height 5\' 8"  (1.727 m), weight 213 lb (96.6 kg), SpO2 98%.   General: No apparent distress alert and oriented x3 mood and affect normal, dressed appropriately.  HEENT: Pupils equal,  extraocular movements intact  Respiratory: Patient's speak in full sentences and does not appear short of breath  Cardiovascular: No lower extremity edema, non tender, no erythema  Low back exam does have some loss lordosis noted.  Some tenderness to palpation of the paraspinal musculature.  Negative straight leg test.  Tightness with FABER bilaterally.  Worsening pain with extension of the back.  Osteopathic findings C6 flexed rotated and side bent left T3 extended rotated and side bent right inhaled rib T4 extended rotated and side bent left inhaled rib T6 extended rotated and side bent right with inhaled rib L2 flexed rotated and side bent right L5 flexed rotated and side bent left Sacrum right on right       Assessment and Plan:  Low back pain Some exacerbation secondary to recent illness.  Some of the coughing seem to cause some more discomfort as well.  Discussed icing regimen of home exercises, which activities to do and which ones to avoid.  Increase activity slowly again.  Discussed the muscle relaxer and anti-inflammatories to use on a intermittent burst formation.  Follow-up with me again in 6 to 8 weeks.  Zanaflex 2 mg at night.    Nonallopathic problems  Decision today to treat with OMT was based on Physical Exam  After verbal consent patient was treated with HVLA, ME, FPR techniques in cervical, rib, thoracic, lumbar, and sacral  areas  Patient tolerated the procedure well with improvement in symptoms  Patient  given exercises, stretches and lifestyle modifications  See medications in patient instructions if given  Patient will follow up in 4-8 weeks     The above documentation has been reviewed and is accurate and complete Judi Saa, DO         Note: This dictation was prepared with Dragon dictation along with smaller phrase technology. Any transcriptional errors that result from this process are unintentional.

## 2024-02-24 ENCOUNTER — Other Ambulatory Visit: Payer: Self-pay | Admitting: Internal Medicine

## 2024-02-24 ENCOUNTER — Encounter: Payer: Self-pay | Admitting: Family Medicine

## 2024-02-24 ENCOUNTER — Ambulatory Visit (INDEPENDENT_AMBULATORY_CARE_PROVIDER_SITE_OTHER): Payer: Commercial Managed Care - PPO | Admitting: Family Medicine

## 2024-02-24 ENCOUNTER — Other Ambulatory Visit (HOSPITAL_COMMUNITY): Payer: Self-pay

## 2024-02-24 VITALS — BP 118/72 | HR 64 | Ht 68.0 in | Wt 213.0 lb

## 2024-02-24 DIAGNOSIS — M9903 Segmental and somatic dysfunction of lumbar region: Secondary | ICD-10-CM | POA: Diagnosis not present

## 2024-02-24 DIAGNOSIS — M9904 Segmental and somatic dysfunction of sacral region: Secondary | ICD-10-CM

## 2024-02-24 DIAGNOSIS — M9901 Segmental and somatic dysfunction of cervical region: Secondary | ICD-10-CM | POA: Diagnosis not present

## 2024-02-24 DIAGNOSIS — M9902 Segmental and somatic dysfunction of thoracic region: Secondary | ICD-10-CM | POA: Diagnosis not present

## 2024-02-24 DIAGNOSIS — M545 Low back pain, unspecified: Secondary | ICD-10-CM

## 2024-02-24 DIAGNOSIS — E785 Hyperlipidemia, unspecified: Secondary | ICD-10-CM

## 2024-02-24 DIAGNOSIS — E781 Pure hyperglyceridemia: Secondary | ICD-10-CM

## 2024-02-24 DIAGNOSIS — M9908 Segmental and somatic dysfunction of rib cage: Secondary | ICD-10-CM

## 2024-02-24 DIAGNOSIS — F39 Unspecified mood [affective] disorder: Secondary | ICD-10-CM

## 2024-02-24 DIAGNOSIS — G8929 Other chronic pain: Secondary | ICD-10-CM | POA: Diagnosis not present

## 2024-02-24 MED ORDER — FLUOXETINE HCL 20 MG PO CAPS
40.0000 mg | ORAL_CAPSULE | Freq: Every day | ORAL | 0 refills | Status: DC
  Filled 2024-02-24: qty 180, 90d supply, fill #0

## 2024-02-24 MED ORDER — PITAVASTATIN CALCIUM 2 MG PO TABS
1.0000 | ORAL_TABLET | Freq: Every day | ORAL | 0 refills | Status: DC
  Filled 2024-02-24: qty 90, 90d supply, fill #0

## 2024-02-24 MED ORDER — FENOFIBRATE 160 MG PO TABS
160.0000 mg | ORAL_TABLET | Freq: Every day | ORAL | 0 refills | Status: DC
  Filled 2024-02-24: qty 90, 90d supply, fill #0

## 2024-02-24 NOTE — Patient Instructions (Signed)
 Good to see you! See you again in 7-8 weeks Keep dem possums off ya yard

## 2024-02-24 NOTE — Assessment & Plan Note (Signed)
 Some exacerbation secondary to recent illness.  Some of the coughing seem to cause some more discomfort as well.  Discussed icing regimen of home exercises, which activities to do and which ones to avoid.  Increase activity slowly again.  Discussed the muscle relaxer and anti-inflammatories to use on a intermittent burst formation.  Follow-up with me again in 6 to 8 weeks.  Zanaflex 2 mg at night.

## 2024-04-09 NOTE — Progress Notes (Signed)
 [facility] [address] [place] [address] Phone: [phone] Subjective:   Jason Phillips, am serving as a scribe for Dr. Ronnell Coins.  I'm seeing this patient by the request  of:  Arcadio Knuckles, MD  CC: Back and neck pain follow-up  [id number]  Jason Phillips is a 43 y.o. male coming in with complaint of back and neck pain. OMT 02/24/2024. Patient states that he was chopping wood and his L elbow pain has increased. PRP injection one year ago was helpful.   Back pain has been manageable.   Medications patient has been prescribed: Zanaflex   Taking:         Reviewed prior external information including notes and imaging from previsou exam, outside providers and external EMR if available.   As well as notes that were available from care everywhere and other healthcare systems.  Past medical history, social, surgical and family history all reviewed in electronic medical record.  No pertanent information unless stated regarding to the chief complaint.   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 POSITIVE muscle aches  Objective  Blood pressure 122/82, pulse 86, height 5\' 8"  (1.727 m), weight 213 lb (96.6 kg), SpO2 97%.   General: No apparent distress alert and oriented x3 mood and affect normal, dressed appropriately.   HEENT: Pupils equal, extraocular movements intact  Respiratory: Patient's speak in full sentences and does not appear short of breath  Cardiovascular: No lower extremity edema, non tender, no erythema  Gait relatively normal MSK:  Back does have loss lordosis noted.  Some tenderness to palpation noted, neck exam does have some limited sidebending bilaterally as well. -Severely tender to palpation over the lateral epicondylar area.  Worsening pain with wrist extension over resistance  Limited muscular skeletal ultrasound was performed and interpreted by Ronnell Coins, M  Limited ultrasound of patient's common extensor tendon shows no true gapping noted.  Some mild increase in Doppler flow.  No neovascularization.  No cortical irregularity noted. Impression: Mild lateral epicondylitis.  Osteopathic findings  C6 flexed rotated and side bent left T3 extended rotated and side bent right inhaled rib T9 extended rotated and side bent left L2 flexed rotated and side bent right L4 flexed rotated and side bent left Sacrum right on right       Assessment and Plan:  Left lateral epicondylitis Recurrent problem.  Did do well with PRP previously.  Will monitor.  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  Follow-up again in 6 to 8 weeks otherwise.    Nonallopathic problems  Decision today to treat with OMT was based on Physical Exam  After verbal consent patient was treated with HVLA, ME, FPR  techniques in cervical, rib, thoracic, lumbar, and sacral  areas  Patient tolerated the procedure well with improvement in symptoms  Patient given exercises, stretches and lifestyle modifications  See medications in patient instructions if given  Patient will follow up in 4-8 weeks     The above documentation has been reviewed and is accurate and complete Jason Margo, DO          Note: This dictation was prepared with Dragon dictation along with smaller phrase technology. Any transcriptional errors that result from this process are unintentional.

## 2024-04-14 ENCOUNTER — Ambulatory Visit: Admitting: Family Medicine

## 2024-04-14 ENCOUNTER — Other Ambulatory Visit: Payer: Self-pay

## 2024-04-14 ENCOUNTER — Encounter: Payer: Self-pay | Admitting: Family Medicine

## 2024-04-14 VITALS — BP 122/82 | HR 86 | Ht 68.0 in | Wt 213.0 lb

## 2024-04-14 DIAGNOSIS — M9904 Segmental and somatic dysfunction of sacral region: Secondary | ICD-10-CM

## 2024-04-14 DIAGNOSIS — M9908 Segmental and somatic dysfunction of rib cage: Secondary | ICD-10-CM | POA: Diagnosis not present

## 2024-04-14 DIAGNOSIS — M9903 Segmental and somatic dysfunction of lumbar region: Secondary | ICD-10-CM | POA: Diagnosis not present

## 2024-04-14 DIAGNOSIS — M9902 Segmental and somatic dysfunction of thoracic region: Secondary | ICD-10-CM

## 2024-04-14 DIAGNOSIS — M9901 Segmental and somatic dysfunction of cervical region: Secondary | ICD-10-CM

## 2024-04-14 DIAGNOSIS — M7712 Lateral epicondylitis, left elbow: Secondary | ICD-10-CM | POA: Diagnosis not present

## 2024-04-14 DIAGNOSIS — M25522 Pain in left elbow: Secondary | ICD-10-CM

## 2024-04-14 NOTE — Patient Instructions (Signed)
 Avoid overhand lifting Keep arm straight at night See me in 6-8 weeks

## 2024-04-14 NOTE — Assessment & Plan Note (Signed)
 Recurrent problem.  Did do well with PRP previously.  Will monitor.  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  Follow-up again in 6 to 8 weeks otherwise.

## 2024-04-15 ENCOUNTER — Other Ambulatory Visit (HOSPITAL_COMMUNITY): Payer: Self-pay

## 2024-04-15 ENCOUNTER — Other Ambulatory Visit: Payer: Self-pay | Admitting: Family Medicine

## 2024-04-15 MED ORDER — TIZANIDINE HCL 2 MG PO TABS
2.0000 mg | ORAL_TABLET | Freq: Every day | ORAL | 0 refills | Status: DC
  Filled 2024-04-15: qty 30, 30d supply, fill #0

## 2024-05-25 ENCOUNTER — Other Ambulatory Visit: Payer: Self-pay | Admitting: Internal Medicine

## 2024-05-25 DIAGNOSIS — F39 Unspecified mood [affective] disorder: Secondary | ICD-10-CM

## 2024-05-25 DIAGNOSIS — E781 Pure hyperglyceridemia: Secondary | ICD-10-CM

## 2024-05-25 DIAGNOSIS — E785 Hyperlipidemia, unspecified: Secondary | ICD-10-CM

## 2024-05-26 DIAGNOSIS — N202 Calculus of kidney with calculus of ureter: Secondary | ICD-10-CM | POA: Diagnosis not present

## 2024-05-27 ENCOUNTER — Other Ambulatory Visit (HOSPITAL_COMMUNITY): Payer: Self-pay

## 2024-05-27 ENCOUNTER — Encounter: Payer: Self-pay | Admitting: Internal Medicine

## 2024-05-27 MED ORDER — FENOFIBRATE 160 MG PO TABS
160.0000 mg | ORAL_TABLET | Freq: Every day | ORAL | 0 refills | Status: DC
  Filled 2024-05-27: qty 90, 90d supply, fill #0

## 2024-05-27 MED ORDER — OMEGA-3-ACID ETHYL ESTERS 1 G PO CAPS
2.0000 | ORAL_CAPSULE | Freq: Two times a day (BID) | ORAL | 0 refills | Status: DC
  Filled 2024-05-27: qty 360, 90d supply, fill #0

## 2024-05-27 MED ORDER — PITAVASTATIN CALCIUM 2 MG PO TABS
1.0000 | ORAL_TABLET | Freq: Every day | ORAL | 0 refills | Status: DC
  Filled 2024-05-27: qty 90, 90d supply, fill #0

## 2024-05-27 MED ORDER — FLUOXETINE HCL 20 MG PO CAPS
40.0000 mg | ORAL_CAPSULE | Freq: Every day | ORAL | 0 refills | Status: DC
  Filled 2024-05-27: qty 180, 90d supply, fill #0

## 2024-05-28 NOTE — Progress Notes (Signed)
**Note Jason Phillips**  [facility] [address] [place] [address] Phone: [phone] Subjective:    I'm seeing this patient by the request  of:  Arcadio Knuckles, MD  CC: back and neck pain follow up   [id number]  [name] is a 43 y.o. male coming in with complaint of back and neck pain. OMT 04/14/2024. Patient states that he has been doing ok but has been having L wrist pain for past week. Pain over ulnar styloid process and up into lateral forearm.   Medications patient has been prescribed: Zanaflex   Taking:         Reviewed prior external information including notes and imaging from previsou exam, outside providers and external EMR if available.   As well as notes that were available from care everywhere and other healthcare systems.  Past medical history, social, surgical and family history all reviewed in electronic medical record.  No pertanent information unless stated regarding to the chief complaint.   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 POSITIVE muscle aches  Objective  Blood pressure 128/84, pulse 80, height 5' 8 (1.727 m), weight 210 lb (95.3 kg), SpO2 98%.   General: No apparent distress alert and oriented x3 mood and affect normal, dressed appropriately.  HEENT: Pupils equal, extraocular movements intact  Respiratory:  Patient's speak in full sentences and does not appear short of breath  Cardiovascular: No lower extremity edema, non tender, no erythema  Gait MSK:  Back does have some loss of lordosis.  Tenderness to palpation throughout.  Neck exam does have some limited sidebending.  Negative Spurling's.  Tightness with Veldon German in the lower back.  Left wrist does have swelling.  Seems to be more on the ulnar aspect of the wrist.  Worsening with ulnar deviation especially against resistance.  Limited muscular skeletal ultrasound was performed and interpreted by Ronnell Coins, M  Limited ultrasound of patient's wrist does show that patient does have some hypoechoic changes noted of the extensor carpi ulnaris.  No true tearing appreciated.  Mild increase in Doppler flow.  No cortical irregularity noted underneath.  Osteopathic findings  C2 flexed rotated and side bent right C6 flexed rotated and side bent left T3 extended rotated and side bent right inhaled rib T9 extended rotated and side bent left L2 flexed rotated and side bent right Sacrum right on right       Assessment and Plan:  Extensor carpi ulnaris tendinitis Severe tendinitis noted at the left wrist today.  We discussed different ulnar deviation and avoiding it.  Discussed taping appropriately, icing regimen and topical anti-inflammatories.  Worsening pain will need to consider injection.  Follow-up again in 6 to 8 weeks  Low back pain Chronic problem after a significant amount of discomfort and pain.  Discussed icing regimen  of home exercises.  Discussed which activities to do and which ones to avoid.  Continue to work on core strengthening.  Follow-up again in 6 to 8 weeks otherwise.    Nonallopathic problems  Decision today to treat with OMT was based on Physical Exam  After verbal consent patient was treated with HVLA, ME, FPR techniques in cervical, rib, thoracic, lumbar, and sacral  areas  Patient tolerated the procedure well  with improvement in symptoms  Patient given exercises, stretches and lifestyle modifications  See medications in patient instructions if given  Patient will follow up in 4-8 weeks     The above documentation has been reviewed and is accurate and complete Alizae Bechtel M Mariell Nester, DO         Note: This dictation was prepared with Dragon dictation along with smaller phrase technology. Any transcriptional errors that result from this process are unintentional.

## 2024-05-29 ENCOUNTER — Other Ambulatory Visit (HOSPITAL_COMMUNITY): Payer: Self-pay

## 2024-05-29 ENCOUNTER — Ambulatory Visit: Admitting: Family Medicine

## 2024-05-29 ENCOUNTER — Other Ambulatory Visit: Payer: Self-pay

## 2024-05-29 ENCOUNTER — Encounter: Payer: Self-pay | Admitting: Family Medicine

## 2024-05-29 VITALS — BP 128/84 | HR 80 | Ht 68.0 in | Wt 210.0 lb

## 2024-05-29 DIAGNOSIS — M25532 Pain in left wrist: Secondary | ICD-10-CM

## 2024-05-29 DIAGNOSIS — M9902 Segmental and somatic dysfunction of thoracic region: Secondary | ICD-10-CM

## 2024-05-29 DIAGNOSIS — M9908 Segmental and somatic dysfunction of rib cage: Secondary | ICD-10-CM | POA: Diagnosis not present

## 2024-05-29 DIAGNOSIS — M545 Low back pain, unspecified: Secondary | ICD-10-CM

## 2024-05-29 DIAGNOSIS — G8929 Other chronic pain: Secondary | ICD-10-CM

## 2024-05-29 DIAGNOSIS — M778 Other enthesopathies, not elsewhere classified: Secondary | ICD-10-CM | POA: Diagnosis not present

## 2024-05-29 DIAGNOSIS — M9904 Segmental and somatic dysfunction of sacral region: Secondary | ICD-10-CM | POA: Diagnosis not present

## 2024-05-29 DIAGNOSIS — M9903 Segmental and somatic dysfunction of lumbar region: Secondary | ICD-10-CM | POA: Diagnosis not present

## 2024-05-29 DIAGNOSIS — M9901 Segmental and somatic dysfunction of cervical region: Secondary | ICD-10-CM

## 2024-05-29 NOTE — Patient Instructions (Signed)
 [place] with work [employer] and [employer]  See me in 6 weeks

## 2024-05-29 NOTE — Assessment & Plan Note (Signed)
 Severe tendinitis noted at the left wrist today.  We discussed different ulnar deviation and avoiding it.  Discussed taping appropriately, icing regimen and topical anti-inflammatories.  Worsening pain will need to consider injection.  Follow-up again in 6 to 8 weeks

## 2024-05-29 NOTE — Assessment & Plan Note (Signed)
 Chronic problem after a significant amount of discomfort and pain.  Discussed icing regimen of home exercises.  Discussed which activities to do and which ones to avoid.  Continue to work on core strengthening.  Follow-up again in 6 to 8 weeks otherwise.

## 2024-06-25 ENCOUNTER — Ambulatory Visit: Payer: Self-pay | Admitting: Internal Medicine

## 2024-06-25 ENCOUNTER — Encounter: Payer: Self-pay | Admitting: Internal Medicine

## 2024-06-25 ENCOUNTER — Other Ambulatory Visit (HOSPITAL_COMMUNITY): Payer: Self-pay

## 2024-06-25 ENCOUNTER — Ambulatory Visit: Admitting: Internal Medicine

## 2024-06-25 VITALS — BP 130/88 | HR 63 | Temp 98.2°F | Resp 16 | Ht 68.0 in | Wt 209.8 lb

## 2024-06-25 DIAGNOSIS — E785 Hyperlipidemia, unspecified: Secondary | ICD-10-CM

## 2024-06-25 DIAGNOSIS — H00021 Hordeolum internum right upper eyelid: Secondary | ICD-10-CM | POA: Diagnosis not present

## 2024-06-25 DIAGNOSIS — I152 Hypertension secondary to endocrine disorders: Secondary | ICD-10-CM

## 2024-06-25 DIAGNOSIS — K219 Gastro-esophageal reflux disease without esophagitis: Secondary | ICD-10-CM

## 2024-06-25 DIAGNOSIS — F39 Unspecified mood [affective] disorder: Secondary | ICD-10-CM

## 2024-06-25 DIAGNOSIS — D2321 Other benign neoplasm of skin of right ear and external auricular canal: Secondary | ICD-10-CM | POA: Diagnosis not present

## 2024-06-25 DIAGNOSIS — E1159 Type 2 diabetes mellitus with other circulatory complications: Secondary | ICD-10-CM | POA: Diagnosis not present

## 2024-06-25 DIAGNOSIS — W57XXXA Bitten or stung by nonvenomous insect and other nonvenomous arthropods, initial encounter: Secondary | ICD-10-CM | POA: Diagnosis not present

## 2024-06-25 DIAGNOSIS — E118 Type 2 diabetes mellitus with unspecified complications: Secondary | ICD-10-CM

## 2024-06-25 DIAGNOSIS — E781 Pure hyperglyceridemia: Secondary | ICD-10-CM

## 2024-06-25 DIAGNOSIS — S40861A Insect bite (nonvenomous) of right upper arm, initial encounter: Secondary | ICD-10-CM | POA: Insufficient documentation

## 2024-06-25 DIAGNOSIS — Z0001 Encounter for general adult medical examination with abnormal findings: Secondary | ICD-10-CM | POA: Diagnosis not present

## 2024-06-25 DIAGNOSIS — G43109 Migraine with aura, not intractable, without status migrainosus: Secondary | ICD-10-CM

## 2024-06-25 DIAGNOSIS — Z7984 Long term (current) use of oral hypoglycemic drugs: Secondary | ICD-10-CM

## 2024-06-25 DIAGNOSIS — I10 Essential (primary) hypertension: Secondary | ICD-10-CM

## 2024-06-25 LAB — URINALYSIS, ROUTINE W REFLEX MICROSCOPIC
Bilirubin Urine: NEGATIVE
Hgb urine dipstick: NEGATIVE
Ketones, ur: NEGATIVE
Leukocytes,Ua: NEGATIVE
Nitrite: NEGATIVE
Specific Gravity, Urine: 1.025 (ref 1.000–1.030)
Total Protein, Urine: NEGATIVE
Urine Glucose: NEGATIVE
Urobilinogen, UA: 0.2 (ref 0.0–1.0)
pH: 6 (ref 5.0–8.0)

## 2024-06-25 LAB — CBC WITH DIFFERENTIAL/PLATELET
Basophils Absolute: 0 K/uL (ref 0.0–0.1)
Basophils Relative: 0.7 % (ref 0.0–3.0)
Eosinophils Absolute: 0.1 K/uL (ref 0.0–0.7)
Eosinophils Relative: 1.9 % (ref 0.0–5.0)
HCT: 37 % — ABNORMAL LOW (ref 39.0–52.0)
Hemoglobin: 13 g/dL (ref 13.0–17.0)
Lymphocytes Relative: 46.1 % — ABNORMAL HIGH (ref 12.0–46.0)
Lymphs Abs: 2.9 K/uL (ref 0.7–4.0)
MCHC: 35.2 g/dL (ref 30.0–36.0)
MCV: 87.1 fl (ref 78.0–100.0)
Monocytes Absolute: 0.4 K/uL (ref 0.1–1.0)
Monocytes Relative: 5.7 % (ref 3.0–12.0)
Neutro Abs: 2.9 K/uL (ref 1.4–7.7)
Neutrophils Relative %: 45.6 % (ref 43.0–77.0)
Platelets: 315 K/uL (ref 150.0–400.0)
RBC: 4.25 Mil/uL (ref 4.22–5.81)
RDW: 12.8 % (ref 11.5–15.5)
WBC: 6.4 K/uL (ref 4.0–10.5)

## 2024-06-25 LAB — MICROALBUMIN / CREATININE URINE RATIO
Creatinine,U: 187.5 mg/dL
Microalb Creat Ratio: 11.6 mg/g (ref 0.0–30.0)
Microalb, Ur: 2.2 mg/dL — ABNORMAL HIGH (ref 0.0–1.9)

## 2024-06-25 LAB — CK: Total CK: 75 U/L (ref 17–232)

## 2024-06-25 LAB — BASIC METABOLIC PANEL WITH GFR
BUN: 15 mg/dL (ref 6–23)
CO2: 24 meq/L (ref 19–32)
Calcium: 9.1 mg/dL (ref 8.4–10.5)
Chloride: 105 meq/L (ref 96–112)
Creatinine, Ser: 0.78 mg/dL (ref 0.40–1.50)
GFR: 108.15 mL/min (ref >60.00)
Glucose, Bld: 134 mg/dL — ABNORMAL HIGH (ref 70–99)
Potassium: 3.7 meq/L (ref 3.5–5.1)
Sodium: 138 meq/L (ref 135–145)

## 2024-06-25 LAB — HEPATIC FUNCTION PANEL
ALT: 30 U/L (ref 0–53)
AST: 27 U/L (ref 0–37)
Albumin: 4.4 g/dL (ref 3.5–5.2)
Alkaline Phosphatase: 45 U/L (ref 39–117)
Bilirubin, Direct: 0.1 mg/dL (ref 0.0–0.3)
Total Bilirubin: 0.4 mg/dL (ref 0.2–1.2)
Total Protein: 7 g/dL (ref 6.0–8.3)

## 2024-06-25 LAB — LIPID PANEL
Cholesterol: 268 mg/dL — ABNORMAL HIGH (ref 0–200)
HDL: 35.8 mg/dL — ABNORMAL LOW (ref >39.00)
NonHDL: 232.5
Total CHOL/HDL Ratio: 7
Triglycerides: 890 mg/dL — ABNORMAL HIGH (ref 0.0–149.0)
VLDL: 178 mg/dL — ABNORMAL HIGH (ref 0.0–40.0)

## 2024-06-25 LAB — HEMOGLOBIN A1C: Hgb A1c MFr Bld: 7.5 % — ABNORMAL HIGH (ref 4.6–6.5)

## 2024-06-25 LAB — LDL CHOLESTEROL, DIRECT: Direct LDL: 92 mg/dL

## 2024-06-25 LAB — TSH: TSH: 2.22 u[IU]/mL (ref 0.35–5.50)

## 2024-06-25 MED ORDER — FENOFIBRATE 160 MG PO TABS
160.0000 mg | ORAL_TABLET | Freq: Every day | ORAL | 1 refills | Status: DC
  Filled 2024-06-25 – 2024-08-19 (×2): qty 90, 90d supply, fill #0
  Filled 2024-11-25: qty 90, 90d supply, fill #1

## 2024-06-25 MED ORDER — OMEGA-3-ACID ETHYL ESTERS 1 G PO CAPS
2.0000 | ORAL_CAPSULE | Freq: Two times a day (BID) | ORAL | 1 refills | Status: DC
  Filled 2024-06-25 – 2024-08-24 (×2): qty 360, 90d supply, fill #0
  Filled 2024-11-25: qty 360, 90d supply, fill #1

## 2024-06-25 MED ORDER — PITAVASTATIN CALCIUM 2 MG PO TABS
1.0000 | ORAL_TABLET | Freq: Every day | ORAL | 1 refills | Status: DC
  Filled 2024-06-25 – 2024-08-24 (×2): qty 90, 90d supply, fill #0
  Filled 2024-11-25: qty 90, 90d supply, fill #1

## 2024-06-25 MED ORDER — UBRELVY 100 MG PO TABS
1.0000 | ORAL_TABLET | ORAL | 2 refills | Status: AC | PRN
  Filled 2024-06-25: qty 8, 30d supply, fill #0
  Filled 2024-09-16: qty 8, 30d supply, fill #1

## 2024-06-25 MED ORDER — METFORMIN HCL ER 750 MG PO TB24
750.0000 mg | ORAL_TABLET | Freq: Every day | ORAL | 1 refills | Status: DC
  Filled 2024-06-25: qty 90, 90d supply, fill #0
  Filled 2024-09-16: qty 90, 90d supply, fill #1

## 2024-06-25 MED ORDER — FLUOXETINE HCL 20 MG PO CAPS
40.0000 mg | ORAL_CAPSULE | Freq: Every day | ORAL | 1 refills | Status: AC
  Filled 2024-06-25 – 2024-08-24 (×2): qty 180, 90d supply, fill #0
  Filled 2024-11-25: qty 180, 90d supply, fill #1

## 2024-06-25 NOTE — Patient Instructions (Signed)
 Health Maintenance, Male  Adopting a healthy lifestyle and getting preventive care are important in promoting health and wellness. Ask your health care provider about:  The right schedule for you to have regular tests and exams.  Things you can do on your own to prevent diseases and keep yourself healthy.  What should I know about diet, weight, and exercise?  Eat a healthy diet    Eat a diet that includes plenty of vegetables, fruits, low-fat dairy products, and lean protein.  Do not eat a lot of foods that are high in solid fats, added sugars, or sodium.  Maintain a healthy weight  Body mass index (BMI) is a measurement that can be used to identify possible weight problems. It estimates body fat based on height and weight. Your health care provider can help determine your BMI and help you achieve or maintain a healthy weight.  Get regular exercise  Get regular exercise. This is one of the most important things you can do for your health. Most adults should:  Exercise for at least 150 minutes each week. The exercise should increase your heart rate and make you sweat (moderate-intensity exercise).  Do strengthening exercises at least twice a week. This is in addition to the moderate-intensity exercise.  Spend less time sitting. Even light physical activity can be beneficial.  Watch cholesterol and blood lipids  Have your blood tested for lipids and cholesterol at 45 years of age, then have this test every 5 years.  You may need to have your cholesterol levels checked more often if:  Your lipid or cholesterol levels are high.  You are older than 45 years of age.  You are at high risk for heart disease.  What should I know about cancer screening?  Many types of cancers can be detected early and may often be prevented. Depending on your health history and family history, you may need to have cancer screening at various ages. This may include screening for:  Colorectal cancer.  Prostate cancer.  Skin cancer.  Lung  cancer.  What should I know about heart disease, diabetes, and high blood pressure?  Blood pressure and heart disease  High blood pressure causes heart disease and increases the risk of stroke. This is more likely to develop in people who have high blood pressure readings or are overweight.  Talk with your health care provider about your target blood pressure readings.  Have your blood pressure checked:  Every 3-5 years if you are 9-95 years of age.  Every year if you are 85 years old or older.  If you are between the ages of 29 and 29 and are a current or former smoker, ask your health care provider if you should have a one-time screening for abdominal aortic aneurysm (AAA).  Diabetes  Have regular diabetes screenings. This checks your fasting blood sugar level. Have the screening done:  Once every three years after age 23 if you are at a normal weight and have a low risk for diabetes.  More often and at a younger age if you are overweight or have a high risk for diabetes.  What should I know about preventing infection?  Hepatitis B  If you have a higher risk for hepatitis B, you should be screened for this virus. Talk with your health care provider to find out if you are at risk for hepatitis B infection.  Hepatitis C  Blood testing is recommended for:  Everyone born from [birth year] through 1967.  Anyone  with known risk factors for hepatitis C.  Sexually transmitted infections (STIs)  You should be screened each year for STIs, including gonorrhea and chlamydia, if:  You are sexually active and are younger than 45 years of age.  You are older than 45 years of age and your health care provider tells you that you are at risk for this type of infection.  Your sexual activity has changed since you were last screened, and you are at increased risk for chlamydia or gonorrhea. Ask your health care provider if you are at risk.  Ask your health care provider about whether you are at high risk for HIV. Your health care provider  may recommend a prescription medicine to help prevent HIV infection. If you choose to take medicine to prevent HIV, you should first get tested for HIV. You should then be tested every 3 months for as long as you are taking the medicine.  Follow these instructions at home:  Alcohol use  Do not drink alcohol if your health care provider tells you not to drink.  If you drink alcohol:  Limit how much you have to 0-2 drinks a day.  Know how much alcohol is in your drink. In the [place] one drink equals one 12 oz bottle of beer (355 mL), one 5 oz glass of wine (148 mL), or one 1 oz glass of hard liquor (44 mL).  Lifestyle  Do not use any products that contain nicotine or tobacco. These products include cigarettes, chewing tobacco, and vaping devices, such as e-cigarettes. If you need help quitting, ask your health care provider.  Do not use street drugs.  Do not share needles.  Ask your health care provider for help if you need support or information about quitting drugs.  General instructions  Schedule regular health, dental, and eye exams.  Stay current with your vaccines.  Tell your health care provider if:  You often feel depressed.  You have ever been abused or do not feel safe at home.  Summary  Adopting a healthy lifestyle and getting preventive care are important in promoting health and wellness.  Follow your health care provider's instructions about healthy diet, exercising, and getting tested or screened for diseases.  Follow your health care provider's instructions on monitoring your cholesterol and blood pressure.  This information is not intended to replace advice given to you by your health care provider. Make sure you discuss any questions you have with your health care provider.  Document Revised: 04/24/2021 Document Reviewed: 04/24/2021  Elsevier Patient Education  2024 [employer].

## 2024-06-25 NOTE — Progress Notes (Signed)
 Subjective:  Patient ID: Jason Phillips, male    DOB: August 17, 1981  Age: 43 y.o. MRN: [id number]  CC: Annual Exam, Hypertension, Gastroesophageal Reflux, and Diabetes   HPI Jason Phillips presents for a CPX and f/up ----  Discussed the use of AI scribe software for clinical note transcription with the patient, who gave verbal consent to proceed.  History of Present Illness   Jason Phillips is a 43 year old male who presents for a routine follow-up visit.  He feels generally well but experiences joint aches, which he attributes to aging. No muscle pain, chest pain, shortness of breath, dizziness, or lightheadedness. He is not experiencing any side effects from his medications.  He is currently taking ibuprofen  for pain management, having previously used Toradol  for kidney stones. He is no longer taking Flomax  as it was also for kidney stones. He is currently taking Ubrelvy , tizanidine , pitavastatin , fish oil supplements, Prozac , and fenofibrate . He uses Freestyle Libre to monitor his blood sugar.  He has a history of mitral regurgitation, noted in previous echocardiograms, and has been informed of a heart murmur in the past, though it is not consistently heard. No symptoms such as shortness of breath, dizziness, or palpitations related to this condition.  He mentions having had several tick bites but does not express concern about them. He describes sweating a lot but notes that his feet do not smell. No symptoms related to blood sugar such as excessive thirst or urination, and no drastic changes in weight or appetite.       Outpatient Medications Prior to Visit  Medication Sig Dispense Refill   blood glucose meter kit and supplies Dispense based on patient and insurance preference. Use up to four times daily as directed. (FOR ICD-10 E10.9, E11.9). 1 each 0   doxylamine, Sleep, (UNISOM) 25 MG tablet Take 25 mg by mouth at bedtime as needed for sleep.     glucose  blood (FREESTYLE LITE) test strip Use as directed to check blood sugar 4 (four) times daily. 100 each 0   tiZANidine  (ZANAFLEX ) 2 MG tablet Take 1 tablet (2 mg total) by mouth at bedtime. (Patient taking differently: Take 2 mg by mouth as needed.) 30 tablet 0   fenofibrate  160 MG tablet Take 1 tablet (160 mg total) by mouth daily. 90 tablet 0   FLUoxetine  (PROZAC ) 20 MG capsule Take 2 capsules (40 mg total) by mouth daily. 180 capsule 0   omega-3 acid ethyl esters (LOVAZA ) 1 g capsule Take 2 capsules (2 g total) by mouth 2 (two) times daily. 360 capsule 0   Pitavastatin  Calcium  (LIVALO ) 2 MG TABS Take 1 tablet (2 mg total) by mouth daily. 90 tablet 0   Ubrogepant  (UBRELVY ) 100 MG TABS Take 1 tablet by mouth as needed. 8 tablet 1   zolpidem  (AMBIEN ) 5 MG tablet Take 1 tablet (5 mg total) by mouth at bedtime as needed for sleep. 90 tablet 1   hydrochlorothiazide  (MICROZIDE ) 12.5 MG capsule Take 1 capsule (12.5 mg total) by mouth daily. 30 capsule 3   ketorolac  (TORADOL ) 10 MG tablet Take 1 tablet by mouth every 8 hours as needed. 15 tablet 0   olmesartan  (BENICAR ) 20 MG tablet Take 1 tablet (20 mg total) by mouth daily. 90 tablet 1   tamsulosin  (FLOMAX ) 0.4 MG CAPS capsule Take 1 capsule (0.4 mg total) by mouth at bedtime. 30 capsule 0   No facility-administered medications prior to visit.  ROS Review of Systems  Constitutional:  Negative for appetite change, chills, diaphoresis, fatigue and fever.  HENT:  Positive for ear pain. Negative for facial swelling.   Eyes:  Negative for visual disturbance.  Respiratory: Negative.  Negative for cough, chest tightness, shortness of breath and wheezing.   Cardiovascular:  Negative for chest pain, palpitations and leg swelling.  Gastrointestinal:  Negative for abdominal pain, constipation, diarrhea, nausea and vomiting.  Genitourinary: Negative.   Musculoskeletal:  Positive for arthralgias. Negative for myalgias.  Skin: Negative.   Neurological:   Positive for headaches. Negative for dizziness, weakness and light-headedness.       One HA every 2-3 weeks. Well controlled with ubrelvy .  Hematological:  Negative for adenopathy. Does not bruise/bleed easily.  Psychiatric/Behavioral: Negative.      Objective:  BP 130/88 (BP Location: Left Arm, Patient Position: Sitting, Cuff Size: Normal)   Pulse 63   Temp 98.2 F (36.8 C) (Oral)   Resp 16   Ht 5' 8 (1.727 m)   Wt 209 lb 12.8 oz (95.2 kg)   SpO2 97%   BMI 31.90 kg/m   BP Readings from Last 3 Encounters:  06/25/24 130/88  05/29/24 128/84  04/14/24 122/82    Wt Readings from Last 3 Encounters:  06/25/24 209 lb 12.8 oz (95.2 kg)  05/29/24 210 lb (95.3 kg)  04/14/24 213 lb (96.6 kg)    Physical Exam Vitals reviewed.  Constitutional:      Appearance: Normal appearance.  HENT:     Nose: Nose normal.     Mouth/Throat:     Mouth: Mucous membranes are moist.  Eyes:     General: No scleral icterus.       Right eye: Hordeolum present.        Left eye: No hordeolum.     Conjunctiva/sclera: Conjunctivae normal.  Cardiovascular:     Rate and Rhythm: Normal rate and regular rhythm.     Heart sounds: Murmur heard.     Systolic murmur is present with a grade of 2/6.     No friction rub. No gallop.  Pulmonary:     Effort: Pulmonary effort is normal.     Breath sounds: No stridor. No wheezing, rhonchi or rales.  Abdominal:     General: Abdomen is flat.     Palpations: There is no mass.     Tenderness: There is no abdominal tenderness. There is no guarding.     Hernia: No hernia is present.  Musculoskeletal:        General: Normal range of motion.     Cervical back: Neck supple.     Right lower leg: No edema.     Left lower leg: No edema.  Lymphadenopathy:     Cervical: No cervical adenopathy.  Skin:    General: Skin is warm and dry.  Neurological:     General: No focal deficit present.     Mental Status: He is alert. Mental status is at baseline.  Psychiatric:         Mood and Affect: Mood normal.        Behavior: Behavior normal.     Lab Results  Component Value Date   WBC 6.4 06/25/2024   HGB 13.0 06/25/2024   HCT 37.0 (L) 06/25/2024   PLT 315.0 06/25/2024   GLUCOSE 134 (H) 06/25/2024   CHOL 268 (H) 06/25/2024   TRIG (H) 06/25/2024    890.0 Triglyceride is over 400; calculations on Lipids are invalid.   HDL  35.80 (L) 06/25/2024   LDLDIRECT 92.0 06/25/2024   LDLCALC 124 (H) 02/19/2020   ALT 30 06/25/2024   AST 27 06/25/2024   NA 138 06/25/2024   K 3.7 06/25/2024   CL 105 06/25/2024   CREATININE 0.78 06/25/2024   BUN 15 06/25/2024   CO2 24 06/25/2024   TSH 2.22 06/25/2024   INR 1.0 03/28/2008   HGBA1C 7.5 (H) 06/25/2024   MICROALBUR 2.2 (H) 06/25/2024    ECHOCARDIOGRAM COMPLETE Result Date: 03/05/2022    ECHOCARDIOGRAM REPORT   Patient Name:   JAHDIEL KROL Date of Exam: 03/05/2022 Medical Rec #:  [id number]           Height:       67.0 in Accession #:    [id number]          Weight:       209.4 lb Date of Birth:  [birth year]           BSA:          2.062 m Patient Age:    40 years            BP:           125/82 mmHg Patient Gender: M                   HR:           67 bpm. Exam Location:  Inpatient Procedure: 2D Echo, Cardiac Doppler and Color Doppler Indications:    R01.1 MURMUR  History:        Patient has no prior history of Echocardiogram examinations.                 Risk Factors:Dyslipidemia. GERD.  Sonographer:    Jeanine Russo Referring Phys: [id number] SAIMA RIZWAN IMPRESSIONS  1. Left ventricular ejection fraction, by estimation, is 60 to 65%. The left ventricle has normal function. The left ventricle has no regional wall motion abnormalities. Left ventricular diastolic parameters were normal.  2. Right ventricular systolic function is normal. The right ventricular size is normal.  3. Left atrial size was moderately dilated.  4. The mitral valve is abnormal. Trivial mitral valve regurgitation. No evidence of mitral stenosis.  5. Mean  gradient across AV 6 mmHg AVA > 2cm2 . The aortic valve is tricuspid. There is mild calcification of the aortic valve. There is mild thickening of the aortic valve. Aortic valve regurgitation is not visualized. Aortic valve sclerosis/calcification is present, without any evidence of aortic stenosis.  6. The inferior vena cava is normal in size with greater than 50% respiratory variability, suggesting right atrial pressure of 3 mmHg. FINDINGS  Left Ventricle: Left ventricular ejection fraction, by estimation, is 60 to 65%. The left ventricle has normal function. The left ventricle has no regional wall motion abnormalities. The left ventricular internal cavity size was normal in size. There is  no left ventricular hypertrophy. Left ventricular diastolic parameters were normal. Right Ventricle: The right ventricular size is normal. No increase in right ventricular wall thickness. Right ventricular systolic function is normal. Left Atrium: Left atrial size was moderately dilated. Right Atrium: Right atrial size was normal in size. Pericardium: There is no evidence of pericardial effusion. Mitral Valve: The mitral valve is abnormal. There is mild thickening of the mitral valve leaflet(s). There is mild calcification of the mitral valve leaflet(s). Trivial mitral valve regurgitation. No evidence of mitral valve stenosis. Tricuspid Valve: The tricuspid valve is normal in structure. Tricuspid valve regurgitation is  not demonstrated. No evidence of tricuspid stenosis. Aortic Valve: Mean gradient across AV 6 mmHg AVA > 2cm2. The aortic valve is tricuspid. There is mild calcification of the aortic valve. There is mild thickening of the aortic valve. Aortic valve regurgitation is not visualized. Aortic valve sclerosis/calcification is present, without any evidence of aortic stenosis. Aortic valve mean gradient measures 6.0 mmHg. Aortic valve peak gradient measures 10.8 mmHg. Aortic valve area, by VTI measures 1.98 cm. Pulmonic  Valve: The pulmonic valve was normal in structure. Pulmonic valve regurgitation is not visualized. No evidence of pulmonic stenosis. Aorta: The aortic root is normal in size and structure. Venous: The inferior vena cava is normal in size with greater than 50% respiratory variability, suggesting right atrial pressure of 3 mmHg. IAS/Shunts: No atrial level shunt detected by color flow Doppler.  LEFT VENTRICLE PLAX 2D LVIDd:         4.60 cm      Diastology LVIDs:         2.70 cm      LV e' medial:    9.57 cm/s LV PW:         1.20 cm      LV E/e' medial:  11.7 LV IVS:        1.10 cm      LV e' lateral:   15.10 cm/s LVOT diam:     1.70 cm      LV E/e' lateral: 7.4 LV SV:         74 LV SV Index:   36 LVOT Area:     2.27 cm  LV Volumes (MOD) LV vol d, MOD A2C: 77.2 ml LV vol d, MOD A4C: 103.0 ml LV vol s, MOD A2C: 27.2 ml LV vol s, MOD A4C: 38.6 ml LV SV MOD A2C:     50.0 ml LV SV MOD A4C:     103.0 ml LV SV MOD BP:      58.7 ml RIGHT VENTRICLE             IVC RV S prime:     13.90 cm/s  IVC diam: 2.00 cm TAPSE (M-mode): 2.6 cm LEFT ATRIUM             Index        RIGHT ATRIUM           Index LA diam:        5.00 cm 2.42 cm/m   RA Area:     11.90 cm LA Vol (A2C):   72.8 ml 35.30 ml/m  RA Volume:   25.50 ml  12.37 ml/m LA Vol (A4C):   96.2 ml 46.65 ml/m LA Biplane Vol: 87.6 ml 42.48 ml/m  AORTIC VALVE                     PULMONIC VALVE AV Area (Vmax):    2.30 cm      PV Vmax:       0.75 m/s AV Area (Vmean):   2.04 cm      PV Vmean:      43.000 cm/s AV Area (VTI):     1.98 cm      PV VTI:        0.158 m AV Vmax:           164.00 cm/s   PV Peak grad:  2.2 mmHg AV Vmean:          117.000 cm/s  PV Mean grad:  1.0 mmHg AV  VTI:            0.372 m AV Peak Grad:      10.8 mmHg AV Mean Grad:      6.0 mmHg LVOT Vmax:         166.00 cm/s LVOT Vmean:        105.000 cm/s LVOT VTI:          0.325 m LVOT/AV VTI ratio: 0.87  AORTA Ao Root diam: 3.00 cm Ao Asc diam:  3.10 cm MITRAL VALVE MV Area (PHT): 2.76 cm     SHUNTS MV Decel  Time: 275 msec     Systemic VTI:  0.32 m MR Peak grad: 83.5 mmHg     Systemic Diam: 1.70 cm MR Mean grad: 54.0 mmHg MR Vmax:      457.00 cm/s MR Vmean:     348.0 cm/s MV E velocity: 112.00 cm/s MV A velocity: 63.00 cm/s MV E/A ratio:  1.78 Maude Emmer MD Electronically signed by Maude Emmer MD Signature Date/Time: 03/05/2022/9:57:48 AM    Final    DG Chest Portable 1 View Result Date: 03/04/2022 CLINICAL DATA:  Sepsis workup.  Left knee drained 4 days ago. EXAM: PORTABLE CHEST 1 VIEW COMPARISON:  December 11, 2017 FINDINGS: The heart size and mediastinal contours are within normal limits. Both lungs are clear. The visualized skeletal structures are unremarkable. IMPRESSION: No active disease. Electronically Signed   By: Alm Pouch III M.D.   On: 03/04/2022 16:13   DG Knee Complete 4 Views Left Result Date: 03/04/2022 CLINICAL DATA:  LEFT knee pain and fever. LEFT knee drained 4 days ago. Initial encounter. EXAM: LEFT KNEE - COMPLETE 4+ VIEW COMPARISON:  02/28/2022 radiograph FINDINGS: There is no evidence of acute fracture, subluxation or dislocation. No radiographic evidence of acute osteomyelitis noted. Prepatellar soft tissue swelling is again noted. An equivocal small effusion in the suprapatellar bursa may be present. IMPRESSION: 1. Prepatellar soft tissue swelling and equivocal small knee effusion. 2. No evidence of acute bony abnormality or radiographic evidence of acute osteomyelitis. 3. No other significant change. Electronically Signed   By: Reyes Phi M.D.   On: 03/04/2022 14:15    Assessment & Plan:  Type II diabetes mellitus with manifestations (HCC)- A1C is up to 7.5%. -     Microalbumin / creatinine urine ratio; Future -     Hemoglobin A1c; Future -     Basic metabolic panel with GFR; Future -     HM Diabetes Foot Exam -     metFORMIN  HCl ER; Take 1 tablet (750 mg total) by mouth daily with breakfast.  Dispense: 90 tablet; Refill: 1 -     AMB Referral [facility] Care  Management  Hypertension associated with diabetes (HCC) -     Lipid panel; Future -     TSH; Future -     Urinalysis, Routine w reflex microscopic; Future -     Basic metabolic panel with GFR; Future -     AMB Referral [facility] Care Management  Hypertriglyceridemia -     CK; Future -     Omega-3-acid  Ethyl Esters; Take 2 capsules (2 g total) by mouth 2 (two) times daily.  Dispense: 360 capsule; Refill: 1 -     Fenofibrate ; Take 1 tablet (160 mg total) by mouth daily.  Dispense: 90 tablet; Refill: 1 -     AMB Referral [facility] Care Management  Primary hypertension- BP is well controlled. -     TSH; Future -  Urinalysis, Routine w reflex microscopic; Future -     Basic metabolic panel with GFR; Future  Gastroesophageal reflux disease without esophagitis -     CBC with Differential/Platelet; Future  Dyslipidemia, goal LDL below 130 -     Lipid panel; Future -     TSH; Future -     Hepatic function panel; Future -     CK; Future -     Pitavastatin  Calcium ; Take 1 tablet (2 mg total) by mouth daily.  Dispense: 90 tablet; Refill: 1  Tick bite of right upper arm, initial encounter -     B. burgdorfi antibodies by WB; Future  Hordeolum internum of right upper eyelid -     Ambulatory referral to Ophthalmology  Dermoid cyst of right ear -     Ambulatory referral to ENT  Migraine with aura and without status migrainosus, not intractable -     Ubrelvy ; Take 1 tablet by mouth as needed.  Dispense: 8 tablet; Refill: 2  Episodic mood disorder (HCC) -     FLUoxetine  HCl; Take 2 capsules (40 mg total) by mouth daily.  Dispense: 180 capsule; Refill: 1  Encounter for general adult medical examination with abnormal findings- Exam completed, labs reviewed, vaccines reviewed (he refused all), no cancer screenings indicated, pt ed material was given.   Other orders -     LDL cholesterol, direct     Follow-up: Return in about 6 months (around 12/26/2024).  Debby Molt, MD

## 2024-06-27 ENCOUNTER — Other Ambulatory Visit (HOSPITAL_COMMUNITY): Payer: Self-pay

## 2024-06-27 ENCOUNTER — Other Ambulatory Visit: Payer: Self-pay | Admitting: Internal Medicine

## 2024-06-27 DIAGNOSIS — E785 Hyperlipidemia, unspecified: Secondary | ICD-10-CM | POA: Insufficient documentation

## 2024-06-27 MED ORDER — EZETIMIBE 10 MG PO TABS
10.0000 mg | ORAL_TABLET | Freq: Every day | ORAL | 1 refills | Status: DC
  Filled 2024-06-27: qty 90, 90d supply, fill #0
  Filled 2024-09-22: qty 90, 90d supply, fill #1

## 2024-06-29 LAB — B. BURGDORFI ANTIBODIES BY WB

## 2024-07-01 ENCOUNTER — Telehealth: Payer: Self-pay

## 2024-07-01 NOTE — Progress Notes (Signed)
 Complex Care Management Note  Care Guide Note 07/01/2024 Name: Jason Phillips MRN: [id number] DOB: 1981-05-12  Jason Phillips is a 43 y.o. year old male who sees Jason Debby CROME, MD for primary care. I reached out to [name] by phone today to offer complex care management services.  [name] was given information about Complex Care Management services today including:   The Complex Care Management services include support from the care team which includes your Nurse Care Manager, Clinical Social Worker, or Pharmacist.  The Complex Care Management team is here to help remove barriers to the health concerns and goals most important to you. Complex Care Management services are voluntary, and the patient may decline or stop services at any time by request to their care team member.   Complex Care Management Consent Status: Patient did not agree to participate in complex care management services at this time.  Follow up plan:    Encounter Outcome:  Patient Refused  Jeoffrey Buffalo , RMA     [facility]  [facility] Guide  Direct Dial: [phone]  Website: [email or web address]

## 2024-07-09 NOTE — Progress Notes (Unsigned)
 [facility] [address] [place] [address] Phone: [phone] Subjective:   ISusannah Phillips, am serving as a scribe for Dr. Arthea Claudene.  I'm seeing this patient by the request  of:  [name], MD  CC: back and neck pain follow up   YEP:Dlagzrupcz  Jason Phillips is a 43 y.o. male coming in with complaint of back and neck pain. OMT 05/29/2024. Also f/u for L wrist pain. Patient states elbows still hurt.  Medications patient has been prescribed: Zanaflex   Taking:         Reviewed prior external information including notes and imaging from previsou exam, outside providers and external EMR if available.   As well as notes that were available from care everywhere and other healthcare systems.  Past medical history, social, surgical and family history all reviewed in electronic medical record.  No pertanent information unless stated regarding to the chief complaint.   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 POSITIVE muscle aches  Objective  Blood pressure 130/84, pulse 76, height 5' 8 (1.727 m), weight 209 lb (94.8 kg), SpO2 98%.   General: No apparent distress alert and oriented x3 mood and affect normal, dressed appropriately.  HEENT: Pupils equal, extraocular movements intact  Respiratory: Patient's speak in full  sentences and does not appear short of breath  Cardiovascular: No lower extremity edema, non tender, no erythema  Gait MSK:  Back does have some loss lordosis noted.  Some tenderness to palpation of the paraspinal musculature.  Tightness with Deri right greater than left.  Neck exam does have some limited sidebending bilaterally. Tender to palpation over the lateral epicondylar areas right greater than left.   Osteopathic findings  C2 flexed rotated and side bent left C7 flexed rotated and side bent right T5 extended rotated and side bent right inhaled rib L2 flexed rotated and side bent right Sacrum right on right       Assessment and Plan:  Low back pain Has had significant intermittent low back pain.  Unfortunately the patient does have type 2 diabetes.  Discussed the importance of core strengthening and weight loss for him.  Patient has made dietary changes and decreased his alcohol intake as well.  Discussed with patient about icing regimen and home exercises, responds well to osteopathic manipulation.  Follow-up again in 2 to 3 months for further evaluation and treatment.    Nonallopathic problems  Decision today to treat with OMT was based on Physical Exam  After verbal consent patient was treated with HVLA, ME, FPR techniques in cervical, rib, thoracic, lumbar, and sacral  areas  Patient tolerated the procedure well with improvement in symptoms  Patient given exercises, stretches and lifestyle modifications  See medications in patient instructions if given  Patient will  follow up in 8 weeks     The above documentation has been reviewed and is accurate and complete Dequon Schnebly M Edris Schneck, DO         Note: This dictation was prepared with Dragon dictation along with smaller phrase technology. Any transcriptional errors that result from this process are unintentional.

## 2024-07-10 ENCOUNTER — Encounter: Payer: Self-pay | Admitting: Family Medicine

## 2024-07-10 ENCOUNTER — Ambulatory Visit: Admitting: Family Medicine

## 2024-07-10 VITALS — BP 130/84 | HR 76 | Ht 68.0 in | Wt 209.0 lb

## 2024-07-10 DIAGNOSIS — M9903 Segmental and somatic dysfunction of lumbar region: Secondary | ICD-10-CM

## 2024-07-10 DIAGNOSIS — G8929 Other chronic pain: Secondary | ICD-10-CM

## 2024-07-10 DIAGNOSIS — M9902 Segmental and somatic dysfunction of thoracic region: Secondary | ICD-10-CM

## 2024-07-10 DIAGNOSIS — M545 Low back pain, unspecified: Secondary | ICD-10-CM

## 2024-07-10 DIAGNOSIS — M9901 Segmental and somatic dysfunction of cervical region: Secondary | ICD-10-CM | POA: Diagnosis not present

## 2024-07-10 DIAGNOSIS — M9904 Segmental and somatic dysfunction of sacral region: Secondary | ICD-10-CM

## 2024-07-10 DIAGNOSIS — M9908 Segmental and somatic dysfunction of rib cage: Secondary | ICD-10-CM

## 2024-07-10 NOTE — Assessment & Plan Note (Signed)
 Has had significant intermittent low back pain.  Unfortunately the patient does have type 2 diabetes.  Discussed the importance of core strengthening and weight loss for him.  Patient has made dietary changes and decreased his alcohol intake as well.  Discussed with patient about icing regimen and home exercises, responds well to osteopathic manipulation.  Follow-up again in 2 to 3 months for further evaluation and treatment.

## 2024-07-10 NOTE — Patient Instructions (Signed)
 Good to see you! Be proud of what you've done See you again in 10-12 weeks

## 2024-08-19 ENCOUNTER — Other Ambulatory Visit: Payer: Self-pay | Admitting: Family Medicine

## 2024-08-19 ENCOUNTER — Other Ambulatory Visit (HOSPITAL_COMMUNITY): Payer: Self-pay

## 2024-08-19 MED ORDER — TIZANIDINE HCL 2 MG PO TABS
2.0000 mg | ORAL_TABLET | Freq: Every day | ORAL | 0 refills | Status: DC
  Filled 2024-08-19: qty 30, 30d supply, fill #0

## 2024-08-20 ENCOUNTER — Institutional Professional Consult (permissible substitution) (INDEPENDENT_AMBULATORY_CARE_PROVIDER_SITE_OTHER)

## 2024-08-25 ENCOUNTER — Other Ambulatory Visit (HOSPITAL_COMMUNITY): Payer: Self-pay

## 2024-09-08 ENCOUNTER — Other Ambulatory Visit (HOSPITAL_COMMUNITY): Payer: Self-pay

## 2024-09-08 ENCOUNTER — Telehealth: Payer: Self-pay

## 2024-09-08 NOTE — Telephone Encounter (Signed)
 Pharmacy Patient Advocate Encounter   Received notification from Onbase that prior authorization for Ubrelvy  100MG  tablets is due for renewal.   Insurance verification completed.   The patient is insured through [facility].  Action: PA required; PA submitted to above mentioned insurance via Latent Key/confirmation #/EOC [facility] Status is pending

## 2024-09-10 NOTE — Telephone Encounter (Signed)
 Pharmacy Patient Advocate Encounter  Received notification from [facility] that Prior Authorization for Ubrelvy  100MG  tablets  has been APPROVED from 09/10/24 to 09/09/25   PA #/Case ID/Reference #: [id number]

## 2024-09-11 NOTE — Telephone Encounter (Signed)
 Patient has been made aware via vm

## 2024-09-16 ENCOUNTER — Other Ambulatory Visit (HOSPITAL_COMMUNITY): Payer: Self-pay

## 2024-09-16 ENCOUNTER — Other Ambulatory Visit: Payer: Self-pay
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
## 2024-09-18 ENCOUNTER — Ambulatory Visit: Admitting: Family Medicine

## 2024-09-18 ENCOUNTER — Encounter: Payer: Self-pay | Admitting: Family Medicine

## 2024-09-18 VITALS — BP 124/82 | HR 60 | Ht 68.0 in | Wt 208.0 lb

## 2024-09-18 DIAGNOSIS — M25521 Pain in right elbow: Secondary | ICD-10-CM | POA: Diagnosis not present

## 2024-09-18 DIAGNOSIS — S56511A Strain of other extensor muscle, fascia and tendon at forearm level, right arm, initial encounter: Secondary | ICD-10-CM | POA: Diagnosis not present

## 2024-09-18 NOTE — Patient Instructions (Signed)
 Tell Crystal you have to be a baby on the couch for 48 hours See you again in 5 weeks Injection today

## 2024-09-18 NOTE — Assessment & Plan Note (Signed)
 Patient injection and tolerated the procedure well, seems to be more of the tendinopathy with potential intersubstance tearing noted.  Discussed with patient about icing regimen and home exercises, discussed which activities to do and which ones to avoid.  Increase activity slowly.  Follow-up again in 6 to 8 weeks otherwise.

## 2024-09-21 ENCOUNTER — Encounter: Payer: Self-pay | Admitting: Internal Medicine

## 2024-10-26 NOTE — Progress Notes (Unsigned)
 [facility] [address] [place] [address] Phone: [phone] Subjective:   ISusannah Phillips, am serving as a scribe for Dr. Arthea Claudene.  I'm seeing this patient by the request  of:  [name], MD  CC: Back pain and elbow pain  YEP:Dlagzrupcz  Jason Phillips is a 43 y.o. male coming in with complaint of back and neck pain. OMT in July 2025. Seen in Oct for R elbow pain.  Given an injection in the right common extensor tendon October 3.  Patient states the elbow is still in pain. Heat works better than ice.  Medications patient has been prescribed: Zanaflex   Taking:         Reviewed prior external information including notes and imaging from previsou exam, outside providers and external EMR if available.   As well as notes that were available from care everywhere and other healthcare systems.  Past medical history, social, surgical and family history all reviewed in electronic medical record.  No pertanent information unless stated regarding to the chief complaint.   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 POSITIVE muscle aches  Objective  Blood pressure 134/86, pulse 72, height 5' 8 (1.727 m), weight 207 lb (93.9 kg), SpO2 98%.   General: No apparent distress alert and oriented x3 mood and affect normal, dressed  appropriately.  HEENT: Pupils equal, extraocular movements intact  Respiratory: Patient's speak in full sentences and does not appear short of breath  Cardiovascular: No lower extremity edema, non tender, no erythema  MSK:  Elbow exam shows ttp in the radial nerve in the forearm  Back does have loss of lordosis tightness in the neck  Neg Spurlings    Procedure: Real-time Ultrasound Guided Injection of right radial nerve sheath in the forearm Device: GE Logiq Q7 Ultrasound guided injection is preferred based studies that show increased duration, increased effect, greater accuracy, decreased procedural pain, increased response rate, and decreased cost with ultrasound guided versus blind injection.  Verbal informed consent obtained.  Time-out conducted.  Noted no overlying erythema, induration, or other signs of local infection.  Skin prepped in a sterile fashion.  Local anesthesia: Topical Ethyl chloride.  With sterile technique and under real time ultrasound guidance: With a 25-gauge half inch needle injected with 0.5 cc 0.5% Marcaine and 0.5 cc of Kenalog 40 mg/mL Completed without difficulty  Pain immediately resolved suggesting accurate placement of the medication.  Advised to call if fevers/chills, erythema, induration, drainage, or persistent bleeding.  Impression: Technically successful ultrasound guided injection.  Osteopathic findings  C3 flexed rotated and side bent right C6 flexed rotated and side bent left T5 extended rotated and side bent right inhaled rib T6 extended rotated and side bent left L2 flexed rotated and side bent right Sacrum right  on right     Assessment and Plan:  Radial nerve entrapment, right Patient given injection and tolerated the procedure well, discussed icing regimen of home exercises, discussed with patient about compression, consider the potential for repeat injections.  Follow-up with me again in 6 to 12 weeks.    Nonallopathic  problems  Decision today to treat with OMT was based on Physical Exam  After verbal consent patient was treated with HVLA, ME, FPR techniques in cervical, rib, thoracic, lumbar, and sacral  areas  Patient tolerated the procedure well with improvement in symptoms  Patient given exercises, stretches and lifestyle modifications  See medications in patient instructions if given  Patient will follow up in 4-8 weeks     The above documentation has been reviewed and is accurate and complete Nakshatra Klose M Yelena Metzer, DO         Note: This dictation was prepared with Dragon dictation along with smaller phrase technology. Any transcriptional errors that result from this process are unintentional.

## 2024-10-27 ENCOUNTER — Ambulatory Visit: Admitting: Family Medicine

## 2024-10-27 ENCOUNTER — Other Ambulatory Visit: Payer: Self-pay

## 2024-10-27 ENCOUNTER — Encounter: Payer: Self-pay | Admitting: Family Medicine

## 2024-10-27 VITALS — BP 134/86 | HR 72 | Ht 68.0 in | Wt 207.0 lb

## 2024-10-27 DIAGNOSIS — M9908 Segmental and somatic dysfunction of rib cage: Secondary | ICD-10-CM | POA: Diagnosis not present

## 2024-10-27 DIAGNOSIS — M9904 Segmental and somatic dysfunction of sacral region: Secondary | ICD-10-CM

## 2024-10-27 DIAGNOSIS — G5631 Lesion of radial nerve, right upper limb: Secondary | ICD-10-CM

## 2024-10-27 DIAGNOSIS — M25521 Pain in right elbow: Secondary | ICD-10-CM

## 2024-10-27 DIAGNOSIS — M9902 Segmental and somatic dysfunction of thoracic region: Secondary | ICD-10-CM | POA: Diagnosis not present

## 2024-10-27 DIAGNOSIS — M9901 Segmental and somatic dysfunction of cervical region: Secondary | ICD-10-CM

## 2024-10-27 DIAGNOSIS — M9903 Segmental and somatic dysfunction of lumbar region: Secondary | ICD-10-CM | POA: Diagnosis not present

## 2024-10-27 NOTE — Patient Instructions (Signed)
 Injection today Good to see you! See you again in 6-8 weeks

## 2024-10-27 NOTE — Assessment & Plan Note (Signed)
 Patient given injection and tolerated the procedure well, discussed icing regimen of home exercises, discussed with patient about compression, consider the potential for repeat injections.  Follow-up with me again in 6 to 12 weeks.

## 2024-11-25 ENCOUNTER — Encounter: Payer: Self-pay | Admitting: Internal Medicine

## 2024-11-29 ENCOUNTER — Other Ambulatory Visit: Payer: Self-pay | Admitting: Family Medicine

## 2024-11-30 ENCOUNTER — Other Ambulatory Visit: Payer: Self-pay

## 2024-11-30 MED ORDER — TIZANIDINE HCL 2 MG PO TABS
2.0000 mg | ORAL_TABLET | Freq: Every day | ORAL | 0 refills | Status: AC
  Filled 2024-11-30: qty 30, 30d supply, fill #0

## 2024-12-08 ENCOUNTER — Other Ambulatory Visit: Payer: Self-pay | Admitting: Internal Medicine

## 2024-12-08 ENCOUNTER — Other Ambulatory Visit: Payer: Self-pay

## 2024-12-08 DIAGNOSIS — E785 Hyperlipidemia, unspecified: Secondary | ICD-10-CM

## 2024-12-08 DIAGNOSIS — E118 Type 2 diabetes mellitus with unspecified complications: Secondary | ICD-10-CM

## 2024-12-08 MED ORDER — EZETIMIBE 10 MG PO TABS
10.0000 mg | ORAL_TABLET | Freq: Every day | ORAL | 1 refills | Status: AC
  Filled 2024-12-08: qty 90, 90d supply, fill #0

## 2024-12-08 MED ORDER — METFORMIN HCL ER 750 MG PO TB24
750.0000 mg | ORAL_TABLET | Freq: Every day | ORAL | 1 refills | Status: AC
  Filled 2024-12-08: qty 90, 90d supply, fill #0

## 2024-12-21 LAB — OPHTHALMOLOGY REPORT-SCANNED

## 2024-12-24 NOTE — Progress Notes (Signed)
 " [facility] [address] [place] [address] Phone: [phone] Subjective:   Jason Phillips am a scribe for Dr. Claudene.   I'm seeing this patient by the request  of:  Jason Debby CROME, MD  CC: right elbow pain follow up   YEP:Dlagzrupcz  Jason Phillips is a 44 y.o. male coming in with complaint of back and neck pain. OMT 10/27/2024. Also f/u for R elbow pain. Patient states that the back and neck is better than it has been. He doesn't have many complaints for the doctor today. Right elbow is doing pretty good as well.   Medications patient has been prescribed: None  Taking: none       Reviewed prior external information including notes and imaging from previsou exam, outside providers and external EMR if available.   As well as notes that were available from care everywhere and other healthcare systems.  Past medical history, social, surgical and family history all reviewed in electronic medical record.  No pertanent information unless stated regarding to the chief complaint.   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 POSITIVE muscle aches  Objective  Blood pressure 132/80, pulse 61, height 5' 8 (1.727 m), weight 204 lb 12.8 oz (92.9 kg), SpO2 97%.   General: No apparent distress alert and oriented x3 mood and affect normal, dressed appropriately.  HEENT: Pupils equal, extraocular movements intact  Respiratory: Patient's speak in full sentences and does  not appear short of breath  Cardiovascular: No lower extremity edema, non tender, no erythema  Gait MSK:  Back low back does have some loss of lordosis noted.  Neck exam does have some significant tenderness noted.  Negative Spurling's.  5 out of 5 strength of the upper extremities.  Osteopathic findings  C2 flexed rotated and side bent right C6 flexed rotated and side bent right T3 extended rotated and side bent right inhaled rib L4 flexed rotated and side bent right Sacrum right on right     Assessment and Plan:  Low back pain Continue to be active.  Stronger core, dropped more weight.  Discussed icing regimen and home exercises.  Increase activity slowly.  Follow-up again in 6 to 8 weeks.  Partial tear of common extensor tendon of right elbow Stable at the moment.    Nonallopathic problems  Decision today to treat with OMT was based on Physical Exam  After verbal consent patient was treated with HVLA, ME, FPR techniques in cervical, rib, thoracic, lumbar, and sacral  areas  Patient tolerated the procedure well with improvement in symptoms  Patient given exercises, stretches and lifestyle modifications  See medications in patient instructions if given  Patient will follow up in 4-8 weeks    The above documentation has been reviewed and is accurate and complete Wanda Rideout M Bain Whichard, DO          Note: This dictation was prepared with Dragon dictation along with smaller phrase technology. Any transcriptional  errors that result from this process are unintentional.            [1]  Allergies Allergen Reactions   Morphine  And Codeine  Nausea And Vomiting    Severe projectile vomiting   Statins     Muscle and body aches   "

## 2024-12-25 ENCOUNTER — Encounter: Payer: Self-pay | Admitting: Family Medicine

## 2024-12-25 ENCOUNTER — Ambulatory Visit: Admitting: Family Medicine

## 2024-12-25 VITALS — BP 132/80 | HR 61 | Ht 68.0 in | Wt 204.8 lb

## 2024-12-25 DIAGNOSIS — M9903 Segmental and somatic dysfunction of lumbar region: Secondary | ICD-10-CM

## 2024-12-25 DIAGNOSIS — M9901 Segmental and somatic dysfunction of cervical region: Secondary | ICD-10-CM | POA: Diagnosis not present

## 2024-12-25 DIAGNOSIS — M545 Low back pain, unspecified: Secondary | ICD-10-CM | POA: Diagnosis not present

## 2024-12-25 DIAGNOSIS — M9904 Segmental and somatic dysfunction of sacral region: Secondary | ICD-10-CM

## 2024-12-25 DIAGNOSIS — S56511A Strain of other extensor muscle, fascia and tendon at forearm level, right arm, initial encounter: Secondary | ICD-10-CM

## 2024-12-25 DIAGNOSIS — M9908 Segmental and somatic dysfunction of rib cage: Secondary | ICD-10-CM

## 2024-12-25 DIAGNOSIS — M9902 Segmental and somatic dysfunction of thoracic region: Secondary | ICD-10-CM

## 2024-12-25 NOTE — Assessment & Plan Note (Signed)
 Continue to be active.  Stronger core, dropped more weight.  Discussed icing regimen and home exercises.  Increase activity slowly.  Follow-up again in 6 to 8 weeks.

## 2024-12-25 NOTE — Patient Instructions (Signed)
Good to see you  Stay active See me again in 2 months

## 2024-12-25 NOTE — Assessment & Plan Note (Signed)
Stable at the moment. 

## 2024-12-30 ENCOUNTER — Ambulatory Visit: Payer: Self-pay | Admitting: Internal Medicine

## 2024-12-30 ENCOUNTER — Encounter: Payer: Self-pay | Admitting: Internal Medicine

## 2024-12-30 ENCOUNTER — Ambulatory Visit: Admitting: Internal Medicine

## 2024-12-30 VITALS — BP 134/86 | HR 62 | Temp 98.6°F | Resp 16 | Ht 68.0 in | Wt 200.2 lb

## 2024-12-30 DIAGNOSIS — I10 Essential (primary) hypertension: Secondary | ICD-10-CM

## 2024-12-30 DIAGNOSIS — E781 Pure hyperglyceridemia: Secondary | ICD-10-CM

## 2024-12-30 DIAGNOSIS — Z1211 Encounter for screening for malignant neoplasm of colon: Secondary | ICD-10-CM | POA: Insufficient documentation

## 2024-12-30 DIAGNOSIS — E118 Type 2 diabetes mellitus with unspecified complications: Secondary | ICD-10-CM

## 2024-12-30 DIAGNOSIS — E785 Hyperlipidemia, unspecified: Secondary | ICD-10-CM | POA: Diagnosis not present

## 2024-12-30 LAB — URINALYSIS, ROUTINE W REFLEX MICROSCOPIC
Bilirubin Urine: NEGATIVE
Hgb urine dipstick: NEGATIVE
Ketones, ur: NEGATIVE
Leukocytes,Ua: NEGATIVE
Nitrite: NEGATIVE
RBC / HPF: NONE SEEN
Specific Gravity, Urine: 1.025 (ref 1.000–1.030)
Total Protein, Urine: NEGATIVE
Urine Glucose: NEGATIVE
Urobilinogen, UA: 0.2 (ref 0.0–1.0)
pH: 6 (ref 5.0–8.0)

## 2024-12-30 LAB — HEPATIC FUNCTION PANEL
ALT: 36 U/L (ref 3–53)
AST: 31 U/L (ref 5–37)
Albumin: 4.6 g/dL (ref 3.5–5.2)
Alkaline Phosphatase: 35 U/L — ABNORMAL LOW (ref 39–117)
Bilirubin, Direct: 0.1 mg/dL (ref 0.1–0.3)
Total Bilirubin: 0.5 mg/dL (ref 0.2–1.2)
Total Protein: 7 g/dL (ref 6.0–8.3)

## 2024-12-30 LAB — MICROALBUMIN / CREATININE URINE RATIO
Creatinine,U: 136.9 mg/dL
Microalb Creat Ratio: 6.3 mg/g (ref 0.0–30.0)
Microalb, Ur: 0.9 mg/dL (ref 0.7–1.9)

## 2024-12-30 LAB — BASIC METABOLIC PANEL WITH GFR
BUN: 14 mg/dL (ref 6–23)
CO2: 25 meq/L (ref 19–32)
Calcium: 9.3 mg/dL (ref 8.4–10.5)
Chloride: 108 meq/L (ref 96–112)
Creatinine, Ser: 0.87 mg/dL (ref 0.40–1.50)
GFR: 104.27 mL/min
Glucose, Bld: 118 mg/dL — ABNORMAL HIGH (ref 70–99)
Potassium: 4.1 meq/L (ref 3.5–5.1)
Sodium: 141 meq/L (ref 135–145)

## 2024-12-30 LAB — LIPID PANEL
Cholesterol: 152 mg/dL (ref 28–200)
HDL: 41.6 mg/dL
LDL Cholesterol: 68 mg/dL (ref 10–99)
NonHDL: 110.63
Total CHOL/HDL Ratio: 4
Triglycerides: 213 mg/dL — ABNORMAL HIGH (ref 10.0–149.0)
VLDL: 42.6 mg/dL — ABNORMAL HIGH (ref 0.0–40.0)

## 2024-12-30 LAB — HEMOGLOBIN A1C: Hgb A1c MFr Bld: 6.5 % (ref 4.6–6.5)

## 2024-12-30 MED ORDER — PITAVASTATIN CALCIUM 2 MG PO TABS
1.0000 | ORAL_TABLET | Freq: Every day | ORAL | 1 refills | Status: DC
  Filled 2024-12-30: qty 90, 90d supply, fill #0

## 2024-12-30 MED ORDER — PITAVASTATIN CALCIUM 2 MG PO TABS
1.0000 | ORAL_TABLET | Freq: Every day | ORAL | 1 refills | Status: AC
  Filled 2024-12-30: qty 90, 90d supply, fill #0

## 2024-12-30 MED ORDER — OMEGA-3-ACID ETHYL ESTERS 1 G PO CAPS
2.0000 | ORAL_CAPSULE | Freq: Two times a day (BID) | ORAL | 1 refills | Status: AC
  Filled 2024-12-30: qty 360, 90d supply, fill #0

## 2024-12-30 MED ORDER — FENOFIBRATE 160 MG PO TABS
160.0000 mg | ORAL_TABLET | Freq: Every day | ORAL | 1 refills | Status: AC
  Filled 2024-12-30: qty 90, 90d supply, fill #0

## 2024-12-30 NOTE — Patient Instructions (Signed)

## 2024-12-30 NOTE — Progress Notes (Signed)
 "  Subjective:  Patient ID: Jason Phillips, male    DOB: 12-Dec-1981  Age: 44 y.o. MRN: [id number]  CC: Diabetes   HPI Jason Phillips presents for f/up ----  Discussed the use of AI scribe software for clinical note transcription with the patient, who gave verbal consent to proceed.  History of Present Illness Jason Phillips is a 44 year old male who presents for a routine follow-up. He is accompanied by Bari, his partner.  He has been monitoring his high triglycerides closely and is currently fasting for his triglyceride check today. No abdominal pain.  He notes a recent, unexplained weight loss, now fitting into size 36 pants, despite consistent appetite and food intake. No nausea, vomiting, abdominal pain, diarrhea, or constipation.  No symptoms related to high blood pressure such as headaches, blurred vision, chest pain, or shortness of breath. He feels good overall.  He does not regularly check his blood sugar and has not experienced symptoms like excessive thirst or urination. He is currently taking metformin  without side effects.  He is physically active, working all day without experiencing chest pain or shortness of breath during activity.  No family history of colon cancer, but his mother died of pancreatic cancer. No blood in stool and reports normal bowel movements.     Outpatient Medications Prior to Visit  Medication Sig Dispense Refill   blood glucose meter kit and supplies Dispense based on patient and insurance preference. Use up to four times daily as directed. (FOR ICD-10 E10.9, E11.9). 1 each 0   doxylamine, Sleep, (UNISOM) 25 MG tablet Take 25 mg by mouth at bedtime as needed for sleep.     ezetimibe  (ZETIA ) 10 MG tablet Take 1 tablet (10 mg total) by mouth daily. 90 tablet 1   FLUoxetine  (PROZAC ) 20 MG capsule Take 2 capsules (40 mg total) by mouth daily. 180 capsule 1   metFORMIN  (GLUCOPHAGE -XR) 750 MG 24 hr tablet Take 1 tablet (750 mg  total) by mouth daily with breakfast. 90 tablet 1   tiZANidine  (ZANAFLEX ) 2 MG tablet Take 1 tablet (2 mg total) by mouth at bedtime. 30 tablet 0   Ubrogepant  (UBRELVY ) 100 MG TABS Take 1 tablet by mouth as needed. 8 tablet 2   fenofibrate  160 MG tablet Take 1 tablet (160 mg total) by mouth daily. 90 tablet 1   omega-3 acid ethyl esters (LOVAZA ) 1 g capsule Take 2 capsules (2 g total) by mouth 2 (two) times daily. 360 capsule 1   Pitavastatin  Calcium  (LIVALO ) 2 MG TABS Take 1 tablet (2 mg total) by mouth daily. 90 tablet 1   No facility-administered medications prior to visit.    ROS Review of Systems  Constitutional:  Positive for unexpected weight change. Negative for appetite change, chills, diaphoresis, fatigue and fever.  HENT: Negative.    Eyes: Negative.   Respiratory: Negative.  Negative for cough, chest tightness, shortness of breath and wheezing.   Cardiovascular:  Negative for chest pain, palpitations and leg swelling.  Gastrointestinal: Negative.  Negative for abdominal pain, constipation, diarrhea, nausea and vomiting.  Endocrine: Negative.   Genitourinary: Negative.  Negative for difficulty urinating.  Musculoskeletal:  Positive for back pain. Negative for joint swelling.  Skin: Negative.   Neurological: Negative.  Negative for dizziness and weakness.  Hematological:  Negative for adenopathy. Does not bruise/bleed easily.  Psychiatric/Behavioral: Negative.      Objective:  BP 134/86 (BP Location: Left Arm, Patient Position: Sitting, Cuff Size: Normal)  Pulse 62   Temp 98.6 F (37 C) (Oral)   Resp 16   Ht 5' 8 (1.727 m)   Wt 200 lb 3.2 oz (90.8 kg)   SpO2 96%   BMI 30.44 kg/m   BP Readings from Last 3 Encounters:  12/30/24 134/86  12/25/24 132/80  10/27/24 134/86    Wt Readings from Last 3 Encounters:  12/30/24 200 lb 3.2 oz (90.8 kg)  12/25/24 204 lb 12.8 oz (92.9 kg)  10/27/24 207 lb (93.9 kg)    Physical Exam Vitals reviewed.  Constitutional:       Appearance: Normal appearance.  HENT:     Nose: Nose normal.     Mouth/Throat:     Mouth: Mucous membranes are moist.  Eyes:     General: No scleral icterus.    Conjunctiva/sclera: Conjunctivae normal.  Cardiovascular:     Rate and Rhythm: Normal rate and regular rhythm.     Heart sounds: No murmur heard. Pulmonary:     Effort: Pulmonary effort is normal.     Breath sounds: No stridor. No wheezing, rhonchi or rales.  Abdominal:     General: Abdomen is flat.     Palpations: There is no mass.     Tenderness: There is no abdominal tenderness. There is no guarding.     Hernia: No hernia is present.  Musculoskeletal:        General: Normal range of motion.     Cervical back: Neck supple.     Right lower leg: No edema.     Left lower leg: No edema.  Lymphadenopathy:     Cervical: No cervical adenopathy.  Skin:    General: Skin is warm and dry.  Neurological:     General: No focal deficit present.     Mental Status: He is alert.  Psychiatric:        Mood and Affect: Mood normal.        Behavior: Behavior normal.     Lab Results  Component Value Date   WBC 6.4 06/25/2024   HGB 13.0 06/25/2024   HCT 37.0 (L) 06/25/2024   PLT 315.0 06/25/2024   GLUCOSE 118 (H) 12/30/2024   CHOL 152 12/30/2024   TRIG 213.0 (H) 12/30/2024   HDL 41.60 12/30/2024   LDLDIRECT 92.0 06/25/2024   LDLCALC 68 12/30/2024   ALT 36 12/30/2024   AST 31 12/30/2024   NA 141 12/30/2024   K 4.1 12/30/2024   CL 108 12/30/2024   CREATININE 0.87 12/30/2024   BUN 14 12/30/2024   CO2 25 12/30/2024   TSH 2.22 06/25/2024   INR 1.0 03/28/2008   HGBA1C 6.5 12/30/2024   MICROALBUR 0.9 12/30/2024    ECHOCARDIOGRAM COMPLETE Result Date: 03/05/2022    ECHOCARDIOGRAM REPORT   Patient Name:   Jason Phillips Date of Exam: 03/05/2022 Medical Rec #:  [id number]           Height:       67.0 in Accession #:    [id number]          Weight:       209.4 lb Date of Birth:  March 03, 1981           BSA:          2.062 m  Patient Age:    40 years            BP:           125/82 mmHg Patient Gender: M  HR:           67 bpm. Exam Location:  Inpatient Procedure: 2D Echo, Cardiac Doppler and Color Doppler Indications:    R01.1 MURMUR  History:        Patient has no prior history of Echocardiogram examinations.                 Risk Factors:Dyslipidemia. GERD.  Sonographer:    Jeanine Russo Referring Phys: [id number] SAIMA RIZWAN IMPRESSIONS  1. Left ventricular ejection fraction, by estimation, is 60 to 65%. The left ventricle has normal function. The left ventricle has no regional wall motion abnormalities. Left ventricular diastolic parameters were normal.  2. Right ventricular systolic function is normal. The right ventricular size is normal.  3. Left atrial size was moderately dilated.  4. The mitral valve is abnormal. Trivial mitral valve regurgitation. No evidence of mitral stenosis.  5. Mean gradient across AV 6 mmHg AVA > 2cm2 . The aortic valve is tricuspid. There is mild calcification of the aortic valve. There is mild thickening of the aortic valve. Aortic valve regurgitation is not visualized. Aortic valve sclerosis/calcification is present, without any evidence of aortic stenosis.  6. The inferior vena cava is normal in size with greater than 50% respiratory variability, suggesting right atrial pressure of 3 mmHg. FINDINGS  Left Ventricle: Left ventricular ejection fraction, by estimation, is 60 to 65%. The left ventricle has normal function. The left ventricle has no regional wall motion abnormalities. The left ventricular internal cavity size was normal in size. There is  no left ventricular hypertrophy. Left ventricular diastolic parameters were normal. Right Ventricle: The right ventricular size is normal. No increase in right ventricular wall thickness. Right ventricular systolic function is normal. Left Atrium: Left atrial size was moderately dilated. Right Atrium: Right atrial size was normal in size.  Pericardium: There is no evidence of pericardial effusion. Mitral Valve: The mitral valve is abnormal. There is mild thickening of the mitral valve leaflet(s). There is mild calcification of the mitral valve leaflet(s). Trivial mitral valve regurgitation. No evidence of mitral valve stenosis. Tricuspid Valve: The tricuspid valve is normal in structure. Tricuspid valve regurgitation is not demonstrated. No evidence of tricuspid stenosis. Aortic Valve: Mean gradient across AV 6 mmHg AVA > 2cm2. The aortic valve is tricuspid. There is mild calcification of the aortic valve. There is mild thickening of the aortic valve. Aortic valve regurgitation is not visualized. Aortic valve sclerosis/calcification is present, without any evidence of aortic stenosis. Aortic valve mean gradient measures 6.0 mmHg. Aortic valve peak gradient measures 10.8 mmHg. Aortic valve area, by VTI measures 1.98 cm. Pulmonic Valve: The pulmonic valve was normal in structure. Pulmonic valve regurgitation is not visualized. No evidence of pulmonic stenosis. Aorta: The aortic root is normal in size and structure. Venous: The inferior vena cava is normal in size with greater than 50% respiratory variability, suggesting right atrial pressure of 3 mmHg. IAS/Shunts: No atrial level shunt detected by color flow Doppler.  LEFT VENTRICLE PLAX 2D LVIDd:         4.60 cm      Diastology LVIDs:         2.70 cm      LV e' medial:    9.57 cm/s LV PW:         1.20 cm      LV E/e' medial:  11.7 LV IVS:        1.10 cm      LV e' lateral:  15.10 cm/s LVOT diam:     1.70 cm      LV E/e' lateral: 7.4 LV SV:         74 LV SV Index:   36 LVOT Area:     2.27 cm  LV Volumes (MOD) LV vol d, MOD A2C: 77.2 ml LV vol d, MOD A4C: 103.0 ml LV vol s, MOD A2C: 27.2 ml LV vol s, MOD A4C: 38.6 ml LV SV MOD A2C:     50.0 ml LV SV MOD A4C:     103.0 ml LV SV MOD BP:      58.7 ml RIGHT VENTRICLE             IVC RV S prime:     13.90 cm/s  IVC diam: 2.00 cm TAPSE (M-mode): 2.6 cm LEFT  ATRIUM             Index        RIGHT ATRIUM           Index LA diam:        5.00 cm 2.42 cm/m   RA Area:     11.90 cm LA Vol (A2C):   72.8 ml 35.30 ml/m  RA Volume:   25.50 ml  12.37 ml/m LA Vol (A4C):   96.2 ml 46.65 ml/m LA Biplane Vol: 87.6 ml 42.48 ml/m  AORTIC VALVE                     PULMONIC VALVE AV Area (Vmax):    2.30 cm      PV Vmax:       0.75 m/s AV Area (Vmean):   2.04 cm      PV Vmean:      43.000 cm/s AV Area (VTI):     1.98 cm      PV VTI:        0.158 m AV Vmax:           164.00 cm/s   PV Peak grad:  2.2 mmHg AV Vmean:          117.000 cm/s  PV Mean grad:  1.0 mmHg AV VTI:            0.372 m AV Peak Grad:      10.8 mmHg AV Mean Grad:      6.0 mmHg LVOT Vmax:         166.00 cm/s LVOT Vmean:        105.000 cm/s LVOT VTI:          0.325 m LVOT/AV VTI ratio: 0.87  AORTA Ao Root diam: 3.00 cm Ao Asc diam:  3.10 cm MITRAL VALVE MV Area (PHT): 2.76 cm     SHUNTS MV Decel Time: 275 msec     Systemic VTI:  0.32 m MR Peak grad: 83.5 mmHg     Systemic Diam: 1.70 cm MR Mean grad: 54.0 mmHg MR Vmax:      457.00 cm/s MR Vmean:     348.0 cm/s MV E velocity: 112.00 cm/s MV A velocity: 63.00 cm/s MV E/A ratio:  1.78 Maude Emmer MD Electronically signed by Maude Emmer MD Signature Date/Time: 03/05/2022/9:57:48 AM    Final    DG Chest Portable 1 View Result Date: 03/04/2022 CLINICAL DATA:  Sepsis workup.  Left knee drained 4 days ago. EXAM: PORTABLE CHEST 1 VIEW COMPARISON:  December 11, 2017 FINDINGS: The heart size and mediastinal contours are within normal limits. Both lungs are clear. The  visualized skeletal structures are unremarkable. IMPRESSION: No active disease. Electronically Signed   By: Alm Pouch III M.D.   On: 03/04/2022 16:13   DG Knee Complete 4 Views Left Result Date: 03/04/2022 CLINICAL DATA:  LEFT knee pain and fever. LEFT knee drained 4 days ago. Initial encounter. EXAM: LEFT KNEE - COMPLETE 4+ VIEW COMPARISON:  02/28/2022 radiograph FINDINGS: There is no evidence of acute  fracture, subluxation or dislocation. No radiographic evidence of acute osteomyelitis noted. Prepatellar soft tissue swelling is again noted. An equivocal small effusion in the suprapatellar bursa may be present. IMPRESSION: 1. Prepatellar soft tissue swelling and equivocal small knee effusion. 2. No evidence of acute bony abnormality or radiographic evidence of acute osteomyelitis. 3. No other significant change. Electronically Signed   By: Reyes Phi M.D.   On: 03/04/2022 14:15    The 10-year ASCVD risk score (Arnett DK, et al., 2019) is: 3.3%   Values used to calculate the score:     Age: 48 years     Clinically relevant sex: Male     Is Non-Hispanic African American: No     Diabetic: Yes     Tobacco smoker: No     Systolic Blood Pressure: 134 mmHg     Is BP treated: No     HDL Cholesterol: 41.6 mg/dL     Total Cholesterol: 152 mg/dL   Assessment & Plan:   Type II diabetes mellitus with manifestations (HCC)- Blood sugar is well controlled. -     Hemoglobin A1c; Future -     Basic metabolic panel with GFR; Future -     Urinalysis, Routine w reflex microscopic; Future -     Microalbumin / creatinine urine ratio; Future -     AMB Referral VBCI Care Management  Hypertriglyceridemia -     Lipid panel; Future -     Hepatic function panel; Future -     Fenofibrate ; Take 1 tablet (160 mg total) by mouth daily.  Dispense: 90 tablet; Refill: 1 -     Omega-3-acid  Ethyl Esters; Take 2 capsules (2 g total) by mouth 2 (two) times daily.  Dispense: 360 capsule; Refill: 1 -     AMB Referral VBCI Care Management  Primary hypertension- BP is adequately well controlled. -     Basic metabolic panel with GFR; Future  Dyslipidemia, goal LDL below 100 -     Lipid panel; Future -     Hepatic function panel; Future -     AMB Referral VBCI Care Management -     Pitavastatin  Calcium ; Take 1 tablet (2 mg total) by mouth daily.  Dispense: 90 tablet; Refill: 1  Screening for colon cancer -      Cologuard     Follow-up: Return in about 6 months (around 06/29/2025).  Debby Molt, MD "

## 2024-12-31 ENCOUNTER — Other Ambulatory Visit (HOSPITAL_COMMUNITY): Payer: Self-pay

## 2024-12-31 ENCOUNTER — Telehealth: Payer: Self-pay | Admitting: *Deleted

## 2024-12-31 NOTE — Progress Notes (Signed)
 Care Guide Pharmacy Note  12/31/2024 Name: Jason Phillips MRN: [id number] DOB: 16-Feb-1981  Referred By: [name], MD Reason for referral: Complex Care Management (Outreach to schedule referral with pharmacist )   Jason Phillips is a 44 y.o. year old male who is a primary care patient of [name], MD.  [name] was referred to the pharmacist for assistance related to: HTN and DMII  Successful contact was made with the patient to discuss pharmacy services including being ready for the pharmacist to call at least 5 minutes before the scheduled appointment time and to have medication bottles and any blood pressure readings ready for review. The patient agreed to meet with the pharmacist via telephone visit on (01/04/2025  Thedford Franks, CMA, [facility]  [facility], [facility] Guide, Lead Direct Dial: [phone]  Fax: [phone]

## 2025-01-04 ENCOUNTER — Other Ambulatory Visit: Admitting: Pharmacist

## 2025-01-04 DIAGNOSIS — E118 Type 2 diabetes mellitus with unspecified complications: Secondary | ICD-10-CM

## 2025-01-04 DIAGNOSIS — E781 Pure hyperglyceridemia: Secondary | ICD-10-CM

## 2025-01-04 NOTE — Patient Instructions (Signed)
 It was a pleasure speaking with you today!  Continue current regimen.  It is recommended to increase fiber, decrease refined, white carbohydrates, limit alcohol to help lower triglycerides.  Feel free to call with any questions or concerns!  Darrelyn Drum, PharmD, BCPS, CPP Clinical Pharmacist Practitioner [facility] at [facility] [phone]

## 2025-01-04 NOTE — Progress Notes (Signed)
 "  01/04/2025 Name: Jason Phillips MRN: [id number] DOB: 15-Jun-1981  Chief Complaint  Patient presents with   Hyperlipidemia   Hypertension   Diabetes    Jason Phillips is a 44 y.o. year old male who presented for a telephone visit.   They were referred to the pharmacist by their PCP for assistance in managing diabetes, hypertension, and hyperlipidemia/cardiovascular risk reduction.    Subjective:  Care Team: Primary Care Provider: [name], MD ; Next Scheduled Visit: n/a  Medication Access/Adherence  Current Pharmacy:  [facility] - [facility] [address] [place] [address] Phone: [phone] Fax: [phone]   Patient reports affordability concerns with their medications: No  Patient reports access/transportation concerns to their pharmacy: No  Patient reports adherence concerns with their medications:  No     Diabetes: -Patient reports unintentional weight loss  Current medications: Metformin  750mg  daily Medications tried in the past: none  Macrovascular and Microvascular Risk Reduction:  Statin? yes (pitavastatin ); ACEi/ARB? no; therapy not indicated  Last urinary albumin/creatinine ratio:  Lab Results  Component Value Date   MICRALBCREAT 6.3 12/30/2024   MICRALBCREAT 11.6 06/25/2024   Last eye exam:  Lab Results  Component Value Date   HMDIABEYEEXA No Retinopathy 12/21/2024   Last foot exam: 06/25/2024 Tobacco Use:  Tobacco Use: Low Risk (12/30/2024)   Patient History    Smoking Tobacco Use: Never    Smokeless Tobacco Use: Never    Passive Exposure: Not on file    Hyperlipidemia/ASCVD Risk Reduction  Current lipid lowering medications: pitavastatin  2mg , zetia  10mg , fenofibrate  160mg , lovaza  2g BID Medications tried in the past: pravastatin , rosuvastatin , simvastatin   Antiplatelet regimen: none  ASCVD History: n/a Risk Factors: T2DM  PREVENT Risk Score:   omesothelioma.fr - 10 year risk of CVD: 5.0% - 10 year risk of ASCVD: 3.3% - 10 year risk of HF: 2.9%   Objective:  Lab Results  Component Value Date   HGBA1C 6.5 12/30/2024    Lab Results  Component Value Date   CREATININE 0.87 12/30/2024   BUN 14 12/30/2024   NA 141 12/30/2024   K 4.1 12/30/2024   CL 108 12/30/2024   CO2 25 12/30/2024    Lab Results  Component Value Date   CHOL 152 12/30/2024   HDL 41.60 12/30/2024   LDLCALC 68 12/30/2024   LDLDIRECT 92.0 06/25/2024   TRIG 213.0 (H) 12/30/2024   CHOLHDL 4 12/30/2024    Medications Reviewed Today     Reviewed by Merceda Lela SAUNDERS, RPH-CPP (Pharmacist) on 01/04/25 at 1551  Med List Status: <None>   Medication Order Taking? Sig Documenting Provider Last Dose Status Informant  blood glucose meter kit and supplies [id number]  Dispense based on patient and insurance preference. Use up to four times daily as directed. (FOR ICD-10 E10.9, E11.9). [name], MD  Active Spouse/Significant Other, Self  doxylamine, Sleep, (UNISOM) 25 MG tablet [id number]  Take 25 mg by mouth at bedtime as needed for sleep. [provider]  Active   ezetimibe  (ZETIA ) 10 MG tablet [id number] Yes Take 1 tablet (10 mg total) by mouth daily. [name], MD  Active   fenofibrate  160 MG tablet [id number] Yes Take 1 tablet (160 mg total) by mouth daily. [name], MD  Active   FLUoxetine  (PROZAC ) 20 MG capsule [id number]  Take 2 capsules (40 mg total) by mouth daily. [name], MD  Active   metFORMIN  (GLUCOPHAGE -XR) 750 MG 24 hr tablet [id number] Yes  Take 1 tablet (750 mg total) by mouth daily with breakfast. [name], MD  Active   omega-3 acid ethyl esters (LOVAZA ) 1 g capsule [id number] Yes Take 2 capsules (2 g total) by mouth 2 (two) times daily. [name], MD  Active   Pitavastatin  Calcium  (LIVALO ) 2 MG TABS [id number] Yes Take  1 tablet (2 mg total) by mouth daily. [name], MD  Active   tiZANidine  (ZANAFLEX ) 2 MG tablet [id number]  Take 1 tablet (2 mg total) by mouth at bedtime. Claudene Arthea HERO, DO  Active   Ubrogepant  (UBRELVY ) 100 MG TABS [id number]  Take 1 tablet by mouth as needed. [name], MD  Active              Assessment/Plan:   Diabetes: - Currently controlled; goal A1c <7%. Cardiorenal risk reduction is optimized. Blood pressure is at goal <130/80. LDL is at goal.  - Recommend to continue Metformin  750mg  - 1 tablet by mouth daily with breakfast. - Discussed Metformin  could contribute to weight loss.  - Could consider discontinuing metformin  if weight loss continues and/or A1c <6%  Hyperlipidemia/ASCVD Risk Reduction: - Currently controlled. LDL goal <70. TG goal <500 (due to pancreatitis risk) - Recommend to continue current therapies - could consider changing Lovaza  to Vascepa to see if any change in TG lowering. Test claim shows Vascepa is $5   Follow Up Plan: PRN  Darrelyn Drum, PharmD, BCPS, CPP Clinical Pharmacist Practitioner [facility] at [facility] [phone]    "

## 2025-02-23 ENCOUNTER — Ambulatory Visit: Admitting: Family Medicine
